# Patient Record
Sex: Male | Born: 1980
Health system: Southern US, Community
[De-identification: ages and names within clinical notes are randomized; demographics above are authoritative.]

## PROBLEM LIST (undated history)

## (undated) DIAGNOSIS — Z21 Asymptomatic human immunodeficiency virus [HIV] infection status: Secondary | ICD-10-CM

## (undated) DIAGNOSIS — B20 Human immunodeficiency virus [HIV] disease: Secondary | ICD-10-CM

## (undated) DIAGNOSIS — F319 Bipolar disorder, unspecified: Secondary | ICD-10-CM

## (undated) DIAGNOSIS — F419 Anxiety disorder, unspecified: Secondary | ICD-10-CM

---

## 2010-10-26 ENCOUNTER — Emergency Department (HOSPITAL_COMMUNITY): Payer: PRIVATE HEALTH INSURANCE

## 2010-10-26 ENCOUNTER — Emergency Department (HOSPITAL_COMMUNITY)
Admission: EM | Admit: 2010-10-26 | Discharge: 2010-10-26 | Disposition: A | Discharge status: Home / Self Care | Payer: PRIVATE HEALTH INSURANCE | Attending: Emergency Medicine | Admitting: Emergency Medicine

## 2010-10-26 DIAGNOSIS — M542 Cervicalgia: Secondary | ICD-10-CM | POA: Insufficient documentation

## 2010-10-26 DIAGNOSIS — S335XXA Sprain of ligaments of lumbar spine, initial encounter: Secondary | ICD-10-CM | POA: Insufficient documentation

## 2010-10-26 DIAGNOSIS — M545 Low back pain, unspecified: Secondary | ICD-10-CM | POA: Insufficient documentation

## 2010-10-26 DIAGNOSIS — S139XXA Sprain of joints and ligaments of unspecified parts of neck, initial encounter: Secondary | ICD-10-CM | POA: Insufficient documentation

## 2011-12-28 ENCOUNTER — Emergency Department (HOSPITAL_COMMUNITY): Payer: Self-pay

## 2011-12-28 ENCOUNTER — Encounter (HOSPITAL_COMMUNITY): Payer: Self-pay

## 2011-12-28 ENCOUNTER — Emergency Department (HOSPITAL_COMMUNITY)
Admission: EM | Admit: 2011-12-28 | Discharge: 2011-12-29 | Disposition: A | Discharge status: Home / Self Care | Payer: Self-pay | Attending: Emergency Medicine | Admitting: Emergency Medicine

## 2011-12-28 DIAGNOSIS — S0180XA Unspecified open wound of other part of head, initial encounter: Secondary | ICD-10-CM | POA: Insufficient documentation

## 2011-12-28 DIAGNOSIS — R51 Headache: Secondary | ICD-10-CM | POA: Insufficient documentation

## 2011-12-28 DIAGNOSIS — S60229A Contusion of unspecified hand, initial encounter: Secondary | ICD-10-CM

## 2011-12-28 DIAGNOSIS — T07XXXA Unspecified multiple injuries, initial encounter: Secondary | ICD-10-CM

## 2011-12-28 DIAGNOSIS — IMO0002 Reserved for concepts with insufficient information to code with codable children: Secondary | ICD-10-CM

## 2011-12-28 DIAGNOSIS — S022XXA Fracture of nasal bones, initial encounter for closed fracture: Secondary | ICD-10-CM | POA: Insufficient documentation

## 2011-12-28 MED ORDER — IBUPROFEN 800 MG PO TABS
800.0000 mg | ORAL_TABLET | Freq: Once | ORAL | Status: AC
  Administered 2011-12-28: 800 mg via ORAL
  Filled 2011-12-28: qty 1
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
## 2011-12-29 ENCOUNTER — Emergency Department (HOSPITAL_COMMUNITY): Payer: Self-pay

## 2011-12-29 MED ORDER — TETANUS-DIPHTH-ACELL PERTUSSIS 5-2.5-18.5 LF-MCG/0.5 IM SUSP
0.5000 mL | Freq: Once | INTRAMUSCULAR | Status: AC
  Administered 2011-12-29: 0.5 mL via INTRAMUSCULAR
  Filled 2011-12-29: qty 0.5

## 2011-12-29 MED ORDER — IBUPROFEN 800 MG PO TABS: 800.0000 mg | ORAL_TABLET | Freq: Three times a day (TID) | ORAL | Status: AC

## 2011-12-29 MED ORDER — HYDROCODONE-ACETAMINOPHEN 5-500 MG PO TABS: 1.0000 | ORAL_TABLET | Freq: Four times a day (QID) | ORAL | Status: AC | PRN

## 2011-12-29 MED ORDER — HYDROMORPHONE HCL PF 1 MG/ML IJ SOLN
1.0000 mg | Freq: Once | INTRAMUSCULAR | Status: AC
  Administered 2011-12-29: 1 mg via INTRAMUSCULAR
  Filled 2011-12-29: qty 1

## 2011-12-29 MED ORDER — CEPHALEXIN 500 MG PO CAPS: 500.0000 mg | ORAL_CAPSULE | Freq: Four times a day (QID) | ORAL | Status: AC

## 2011-12-29 MED ORDER — LIDOCAINE HCL (PF) 1 % IJ SOLN
INTRAMUSCULAR | Status: AC
  Administered 2011-12-29: 02:00:00 via INTRADERMAL
  Filled 2011-12-29: qty 5

## 2011-12-29 NOTE — ED Notes (Signed)
Discharge instructions reviewed with pt, questions answered. Pt verbalized understanding.  
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
## 2011-12-29 NOTE — Discharge Instructions (Signed)
Facial Fracture  A facial fracture is a break in one of the bones of your face. Your nasal bones have been fractured. HOME CARE INSTRUCTIONS  Protect the injured part of your face until it is healed.  Do not participate in activities which give chance for re-injury until your doctor approves.  Gently wash and dry your face.  Wear head and facial protection while riding a bicycle, motorcycle, or snowmobile.  SEEK MEDICAL CARE IF:  An oral temperature above 102 F (38.9 C) develops.  You have severe headaches or notice changes in your vision.  You have new numbness or tingling in your face.  You develop nausea (feeling sick to your stomach), vomiting or a stiff neck.  SEEK IMMEDIATE MEDICAL CARE IF:  You develop difficulty seeing or experience double vision.  You become dizzy, lightheaded, or faint.  You develop trouble speaking, breathing, or swallowing.  You have a watery discharge from your nose or ear.  MAKE SURE YOU:  Understand these instructions.  Will watch your condition.  Will get help right away if you are not doing well or get worse.

## 2012-08-29 ENCOUNTER — Encounter (HOSPITAL_COMMUNITY): Payer: Self-pay | Admitting: Emergency Medicine

## 2012-08-29 ENCOUNTER — Emergency Department (HOSPITAL_COMMUNITY)
Admission: EM | Admit: 2012-08-29 | Discharge: 2012-08-29 | Disposition: A | Discharge status: Home / Self Care | Payer: Self-pay | Attending: Emergency Medicine | Admitting: Emergency Medicine

## 2012-08-29 DIAGNOSIS — S61409A Unspecified open wound of unspecified hand, initial encounter: Secondary | ICD-10-CM | POA: Insufficient documentation

## 2012-08-29 DIAGNOSIS — T07XXXA Unspecified multiple injuries, initial encounter: Secondary | ICD-10-CM

## 2012-08-29 DIAGNOSIS — F1910 Other psychoactive substance abuse, uncomplicated: Secondary | ICD-10-CM | POA: Insufficient documentation

## 2012-08-29 DIAGNOSIS — F17200 Nicotine dependence, unspecified, uncomplicated: Secondary | ICD-10-CM | POA: Insufficient documentation

## 2012-08-29 DIAGNOSIS — S21109A Unspecified open wound of unspecified front wall of thorax without penetration into thoracic cavity, initial encounter: Secondary | ICD-10-CM | POA: Insufficient documentation

## 2012-08-29 DIAGNOSIS — S41109A Unspecified open wound of unspecified upper arm, initial encounter: Secondary | ICD-10-CM | POA: Insufficient documentation

## 2012-08-29 LAB — RAPID URINE DRUG SCREEN, HOSP PERFORMED
Benzodiazepines: POSITIVE — AB
Cocaine: POSITIVE — AB
Opiates: NOT DETECTED

## 2012-08-29 MED ORDER — IBUPROFEN 800 MG PO TABS
800.0000 mg | ORAL_TABLET | Freq: Once | ORAL | Status: AC
  Administered 2012-08-29: 800 mg via ORAL
  Filled 2012-08-29: qty 1
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
## 2013-04-09 ENCOUNTER — Encounter (HOSPITAL_COMMUNITY): Payer: Self-pay | Admitting: Emergency Medicine

## 2013-04-09 ENCOUNTER — Emergency Department (HOSPITAL_COMMUNITY)
Admission: EM | Admit: 2013-04-09 | Discharge: 2013-04-09 | Disposition: A | Discharge status: Home / Self Care | Payer: No Typology Code available for payment source | Attending: Emergency Medicine | Admitting: Emergency Medicine

## 2013-04-09 DIAGNOSIS — S139XXA Sprain of joints and ligaments of unspecified parts of neck, initial encounter: Secondary | ICD-10-CM | POA: Insufficient documentation

## 2013-04-09 DIAGNOSIS — Z79899 Other long term (current) drug therapy: Secondary | ICD-10-CM | POA: Insufficient documentation

## 2013-04-09 DIAGNOSIS — IMO0002 Reserved for concepts with insufficient information to code with codable children: Secondary | ICD-10-CM | POA: Insufficient documentation

## 2013-04-09 DIAGNOSIS — F17200 Nicotine dependence, unspecified, uncomplicated: Secondary | ICD-10-CM | POA: Insufficient documentation

## 2013-04-09 DIAGNOSIS — S39012A Strain of muscle, fascia and tendon of lower back, initial encounter: Secondary | ICD-10-CM

## 2013-04-09 DIAGNOSIS — Y9389 Activity, other specified: Secondary | ICD-10-CM | POA: Insufficient documentation

## 2013-04-09 DIAGNOSIS — Y92410 Unspecified street and highway as the place of occurrence of the external cause: Secondary | ICD-10-CM | POA: Insufficient documentation

## 2013-04-09 DIAGNOSIS — S161XXA Strain of muscle, fascia and tendon at neck level, initial encounter: Secondary | ICD-10-CM

## 2013-04-09 MED ORDER — METHOCARBAMOL 500 MG PO TABS: 500.0000 mg | ORAL_TABLET | Freq: Two times a day (BID) | ORAL | Status: DC

## 2013-04-09 MED ORDER — HYDROCODONE-ACETAMINOPHEN 5-325 MG PO TABS: 1.0000 | ORAL_TABLET | ORAL | Status: DC | PRN

## 2013-04-09 MED ORDER — IBUPROFEN 800 MG PO TABS
800.0000 mg | ORAL_TABLET | Freq: Once | ORAL | Status: AC
  Administered 2013-04-09: 800 mg via ORAL
  Filled 2013-04-09: qty 1
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
## 2013-04-10 NOTE — ED Provider Notes (Signed)
Medical screening examination/treatment/procedure(s) were performed by non-physician practitioner and as supervising physician I was immediately available for consultation/collaboration.   Shanna Cisco, MD 04/10/13 [phone]

## 2013-04-22 ENCOUNTER — Encounter (HOSPITAL_COMMUNITY): Payer: Self-pay | Admitting: Emergency Medicine

## 2013-04-22 ENCOUNTER — Emergency Department (HOSPITAL_COMMUNITY)
Admission: EM | Admit: 2013-04-22 | Discharge: 2013-04-23 | Disposition: A | Discharge status: Home / Self Care | Payer: No Typology Code available for payment source | Attending: Emergency Medicine | Admitting: Emergency Medicine

## 2013-04-22 DIAGNOSIS — M542 Cervicalgia: Secondary | ICD-10-CM | POA: Insufficient documentation

## 2013-04-22 DIAGNOSIS — S39012S Strain of muscle, fascia and tendon of lower back, sequela: Secondary | ICD-10-CM

## 2013-04-22 DIAGNOSIS — M545 Low back pain, unspecified: Secondary | ICD-10-CM | POA: Insufficient documentation

## 2013-04-22 DIAGNOSIS — S161XXS Strain of muscle, fascia and tendon at neck level, sequela: Secondary | ICD-10-CM

## 2013-04-22 DIAGNOSIS — F17200 Nicotine dependence, unspecified, uncomplicated: Secondary | ICD-10-CM | POA: Insufficient documentation

## 2013-04-22 DIAGNOSIS — Z791 Long term (current) use of non-steroidal anti-inflammatories (NSAID): Secondary | ICD-10-CM | POA: Insufficient documentation

## 2013-04-22 MED ORDER — HYDROCODONE-ACETAMINOPHEN 5-325 MG PO TABS
1.0000 | ORAL_TABLET | Freq: Once | ORAL | Status: AC
  Administered 2013-04-22: 1 via ORAL
  Filled 2013-04-22: qty 1
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
## 2013-04-23 ENCOUNTER — Emergency Department (HOSPITAL_COMMUNITY): Payer: No Typology Code available for payment source

## 2013-04-23 MED ORDER — NAPROXEN 500 MG PO TABS: 500.0000 mg | ORAL_TABLET | Freq: Two times a day (BID) | ORAL | Status: DC

## 2013-04-23 MED ORDER — CYCLOBENZAPRINE HCL 10 MG PO TABS: 10.0000 mg | ORAL_TABLET | Freq: Two times a day (BID) | ORAL | Status: DC | PRN

## 2013-04-23 NOTE — ED Provider Notes (Signed)
Medical screening examination/treatment/procedure(s) were performed by non-physician practitioner and as supervising physician I was immediately available for consultation/collaboration.  Olivia Mackie, MD 04/23/13 [phone]

## 2013-07-03 ENCOUNTER — Encounter (HOSPITAL_COMMUNITY): Payer: Self-pay | Admitting: Emergency Medicine

## 2013-07-03 ENCOUNTER — Emergency Department (HOSPITAL_COMMUNITY): Payer: PRIVATE HEALTH INSURANCE

## 2013-07-03 ENCOUNTER — Emergency Department (HOSPITAL_COMMUNITY)
Admission: EM | Admit: 2013-07-03 | Discharge: 2013-07-03 | Payer: Self-pay | Attending: Emergency Medicine | Admitting: Emergency Medicine

## 2013-07-03 DIAGNOSIS — Z791 Long term (current) use of non-steroidal anti-inflammatories (NSAID): Secondary | ICD-10-CM | POA: Insufficient documentation

## 2013-07-03 DIAGNOSIS — IMO0002 Reserved for concepts with insufficient information to code with codable children: Secondary | ICD-10-CM | POA: Insufficient documentation

## 2013-07-03 DIAGNOSIS — S0993XA Unspecified injury of face, initial encounter: Secondary | ICD-10-CM | POA: Insufficient documentation

## 2013-07-03 DIAGNOSIS — S51809A Unspecified open wound of unspecified forearm, initial encounter: Secondary | ICD-10-CM | POA: Insufficient documentation

## 2013-07-03 DIAGNOSIS — S298XXA Other specified injuries of thorax, initial encounter: Secondary | ICD-10-CM | POA: Insufficient documentation

## 2013-07-03 DIAGNOSIS — S0003XA Contusion of scalp, initial encounter: Secondary | ICD-10-CM | POA: Insufficient documentation

## 2013-07-03 DIAGNOSIS — F17200 Nicotine dependence, unspecified, uncomplicated: Secondary | ICD-10-CM | POA: Insufficient documentation

## 2013-07-03 DIAGNOSIS — S060X0A Concussion without loss of consciousness, initial encounter: Secondary | ICD-10-CM | POA: Insufficient documentation

## 2013-07-03 HISTORY — DX: Asymptomatic human immunodeficiency virus (hiv) infection status: Z21

## 2013-07-03 HISTORY — DX: Human immunodeficiency virus (HIV) disease: B20

## 2013-07-03 MED ORDER — HYDROCODONE-ACETAMINOPHEN 5-325 MG PO TABS: 2.0000 | ORAL_TABLET | Freq: Once | ORAL | Status: DC

## 2013-07-03 MED ORDER — HYDROCODONE-ACETAMINOPHEN 5-325 MG PO TABS: 1.0000 | ORAL_TABLET | Freq: Four times a day (QID) | ORAL | Status: DC | PRN

## 2013-07-03 MED ORDER — HYDROCODONE-ACETAMINOPHEN 5-325 MG PO TABS
2.0000 | ORAL_TABLET | Freq: Once | ORAL | Status: AC
  Administered 2013-07-03: 2 via ORAL
  Filled 2013-07-03: qty 2
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
## 2013-07-03 NOTE — ED Notes (Signed)
Bed: ZO10 Expected date:  Expected time:  Means of arrival:  Comments: ems- fall/ hematoma?

## 2013-07-16 ENCOUNTER — Ambulatory Visit (INDEPENDENT_AMBULATORY_CARE_PROVIDER_SITE_OTHER): Payer: Self-pay

## 2013-07-16 DIAGNOSIS — B20 Human immunodeficiency virus [HIV] disease: Secondary | ICD-10-CM

## 2013-07-16 DIAGNOSIS — Z113 Encounter for screening for infections with a predominantly sexual mode of transmission: Secondary | ICD-10-CM

## 2013-07-16 DIAGNOSIS — Z23 Encounter for immunization: Secondary | ICD-10-CM

## 2013-07-16 DIAGNOSIS — Z79899 Other long term (current) drug therapy: Secondary | ICD-10-CM

## 2013-07-16 LAB — CBC WITH DIFFERENTIAL/PLATELET
Basophils Relative: 1 % (ref 0–1)
Eosinophils Relative: 3 % (ref 0–5)
Lymphocytes Relative: 30 % (ref 12–46)
Lymphs Abs: 1.3 10*3/uL (ref 0.7–4.0)
Neutro Abs: 2.5 10*3/uL (ref 1.7–7.7)
Neutrophils Relative %: 58 % (ref 43–77)
Platelets: 191 10*3/uL (ref 150–400)
RBC: 4.69 MIL/uL (ref 4.22–5.81)
WBC: 4.3 10*3/uL (ref 4.0–10.5)

## 2013-07-16 LAB — COMPLETE METABOLIC PANEL WITH GFR
ALT: 14 U/L (ref 0–53)
AST: 17 U/L (ref 0–37)
Alkaline Phosphatase: 48 U/L (ref 39–117)
CO2: 30 mEq/L (ref 19–32)
Calcium: 8.8 mg/dL (ref 8.4–10.5)
Chloride: 106 mEq/L (ref 96–112)
Creat: 1.04 mg/dL (ref 0.50–1.35)
GFR, Est African American: >89 mL/min
Sodium: 140 mEq/L (ref 135–145)
Total Bilirubin: 0.4 mg/dL (ref 0.3–1.2)
Total Protein: 7.1 g/dL (ref 6.0–8.3)

## 2013-07-16 LAB — RPR

## 2013-07-16 LAB — HEPATITIS A ANTIBODY, TOTAL: Hep A Total Ab: NONREACTIVE

## 2013-07-16 LAB — LIPID PANEL
Cholesterol: 122 mg/dL (ref 0–200)
HDL: 40 mg/dL (ref >39)
Total CHOL/HDL Ratio: 3.1 Ratio

## 2013-07-16 LAB — HEPATITIS C ANTIBODY: HCV Ab: NEGATIVE

## 2013-07-16 LAB — HEPATITIS B SURFACE ANTIBODY,QUALITATIVE: Hep B S Ab: NEGATIVE

## 2013-07-17 LAB — URINALYSIS
Hgb urine dipstick: NEGATIVE
Leukocytes, UA: NEGATIVE
Nitrite: NEGATIVE
Protein, ur: NEGATIVE mg/dL
Urobilinogen, UA: 0.2 mg/dL (ref 0.0–1.0)
pH: 5.5 (ref 5.0–8.0)

## 2013-07-17 LAB — T-HELPER CELL (CD4) - (RCID CLINIC ONLY)
CD4 % Helper T Cell: 22 % — ABNORMAL LOW (ref 33–55)
CD4 T Cell Abs: 260 /uL — ABNORMAL LOW (ref 400–2700)

## 2013-07-19 LAB — HIV-1 RNA ULTRAQUANT REFLEX TO GENTYP+
HIV 1 RNA Quant: 63493 copies/mL — ABNORMAL HIGH (ref <20)
HIV-1 RNA Quant, Log: 4.8 {Log} — ABNORMAL HIGH (ref <1.30)

## 2013-07-20 DIAGNOSIS — B20 Human immunodeficiency virus [HIV] disease: Secondary | ICD-10-CM | POA: Insufficient documentation
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
## 2013-07-22 LAB — HIV-1 GENOTYPR PLUS

## 2013-08-03 ENCOUNTER — Ambulatory Visit (INDEPENDENT_AMBULATORY_CARE_PROVIDER_SITE_OTHER): Payer: Self-pay | Admitting: Internal Medicine

## 2013-08-03 ENCOUNTER — Encounter: Payer: Self-pay | Admitting: Internal Medicine

## 2013-08-03 VITALS — BP 160/105 | HR 91 | Temp 98.6°F | Ht 73.0 in | Wt 198.5 lb

## 2013-08-03 DIAGNOSIS — F17200 Nicotine dependence, unspecified, uncomplicated: Secondary | ICD-10-CM | POA: Insufficient documentation

## 2013-08-03 DIAGNOSIS — F329 Major depressive disorder, single episode, unspecified: Secondary | ICD-10-CM

## 2013-08-03 DIAGNOSIS — B20 Human immunodeficiency virus [HIV] disease: Secondary | ICD-10-CM

## 2013-08-03 DIAGNOSIS — Z23 Encounter for immunization: Secondary | ICD-10-CM

## 2013-08-03 MED ORDER — ELVITEG-COBIC-EMTRICIT-TENOFDF 150-150-200-300 MG PO TABS: 1.0000 | ORAL_TABLET | Freq: Every day | ORAL | Status: DC

## 2013-08-03 NOTE — Assessment & Plan Note (Signed)
He is having depression with his life situation.  He was given information for counseling and a support group

## 2013-08-03 NOTE — Assessment & Plan Note (Signed)
I did discuss all the treatment options and need for compliance. He tells me he is motivated for treatment and ready to start therapy. We have decided to start [facility].he will get this is a drug assistance program. I will have him return in about 3 weeks to see if he has started the medication and able to get it and then schedule lab visit after that. Side effects were discussed with him. He does have a baseline 103n.  All questions were answered.

## 2013-08-03 NOTE — Assessment & Plan Note (Signed)
I discussed smoking cessation. 
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
## 2013-08-20 ENCOUNTER — Other Ambulatory Visit: Payer: Self-pay | Admitting: *Deleted

## 2013-08-20 DIAGNOSIS — B20 Human immunodeficiency virus [HIV] disease: Secondary | ICD-10-CM

## 2013-08-20 MED ORDER — ELVITEG-COBIC-EMTRICIT-TENOFDF 150-150-200-300 MG PO TABS: 1.0000 | ORAL_TABLET | Freq: Every day | ORAL | Status: DC

## 2013-08-24 ENCOUNTER — Ambulatory Visit: Payer: PRIVATE HEALTH INSURANCE | Admitting: Internal Medicine

## 2013-08-24 ENCOUNTER — Emergency Department (HOSPITAL_COMMUNITY)
Admission: EM | Admit: 2013-08-24 | Discharge: 2013-08-25 | Disposition: A | Discharge status: Home / Self Care | Payer: PRIVATE HEALTH INSURANCE | Attending: Emergency Medicine | Admitting: Emergency Medicine

## 2013-08-24 ENCOUNTER — Encounter (HOSPITAL_COMMUNITY): Payer: Self-pay | Admitting: Emergency Medicine

## 2013-08-24 DIAGNOSIS — Z21 Asymptomatic human immunodeficiency virus [HIV] infection status: Secondary | ICD-10-CM | POA: Insufficient documentation

## 2013-08-24 DIAGNOSIS — Z79899 Other long term (current) drug therapy: Secondary | ICD-10-CM | POA: Insufficient documentation

## 2013-08-24 DIAGNOSIS — B20 Human immunodeficiency virus [HIV] disease: Secondary | ICD-10-CM

## 2013-08-24 DIAGNOSIS — S0181XA Laceration without foreign body of other part of head, initial encounter: Secondary | ICD-10-CM

## 2013-08-24 DIAGNOSIS — S0121XA Laceration without foreign body of nose, initial encounter: Secondary | ICD-10-CM

## 2013-08-24 DIAGNOSIS — S0120XA Unspecified open wound of nose, initial encounter: Secondary | ICD-10-CM | POA: Insufficient documentation

## 2013-08-24 DIAGNOSIS — F17200 Nicotine dependence, unspecified, uncomplicated: Secondary | ICD-10-CM | POA: Insufficient documentation
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
## 2013-09-08 ENCOUNTER — Telehealth: Payer: Self-pay | Admitting: *Deleted

## 2013-09-08 NOTE — Telephone Encounter (Signed)
Message copied by [name], Kendrew Paci M on Tue Sep 08, 2013 10:18 AM ------      Message from: Gardiner BarefootOMER, ROBERT W      Created: Mon Sep 07, 2013 12:38 PM       Can you see if he has started Stribild.  He missed his follow up appt.  Also, there was a mention in an ED note that he may be going to jail.  Thanks ------

## 2013-09-08 NOTE — Telephone Encounter (Signed)
Left message asking patient to return our call re: medication and follow up appointment.  Per [facility], patient never picked up the stribild. [facility] [place] [facility]. [name], Kenidee Cregan M, RN

## 2013-10-13 ENCOUNTER — Ambulatory Visit: Payer: PRIVATE HEALTH INSURANCE | Admitting: Internal Medicine

## 2013-10-20 ENCOUNTER — Encounter: Payer: Self-pay | Admitting: Internal Medicine

## 2013-10-20 ENCOUNTER — Ambulatory Visit (INDEPENDENT_AMBULATORY_CARE_PROVIDER_SITE_OTHER): Payer: PRIVATE HEALTH INSURANCE | Admitting: Internal Medicine

## 2013-10-20 VITALS — BP 150/75 | HR 97 | Temp 97.9°F | Wt 193.0 lb

## 2013-10-20 DIAGNOSIS — B20 Human immunodeficiency virus [HIV] disease: Secondary | ICD-10-CM

## 2013-10-20 LAB — CBC WITH DIFFERENTIAL/PLATELET
BASOS PCT: 1 % (ref 0–1)
Basophils Absolute: 0 10*3/uL (ref 0.0–0.1)
EOS PCT: 1 % (ref 0–5)
Eosinophils Absolute: 0 10*3/uL (ref 0.0–0.7)
HEMATOCRIT: 47 % (ref 39.0–52.0)
Hemoglobin: 17.1 g/dL — ABNORMAL HIGH (ref 13.0–17.0)
Lymphocytes Relative: 34 % (ref 12–46)
Lymphs Abs: 1 10*3/uL (ref 0.7–4.0)
MCH: 32.8 pg (ref 26.0–34.0)
MCHC: 36.4 g/dL — AB (ref 30.0–36.0)
MCV: 90.2 fL (ref 78.0–100.0)
MONO ABS: 0.2 10*3/uL (ref 0.1–1.0)
MONOS PCT: 8 % (ref 3–12)
NEUTROS ABS: 1.8 10*3/uL (ref 1.7–7.7)
Neutrophils Relative %: 56 % (ref 43–77)
Platelets: 196 10*3/uL (ref 150–400)
RBC: 5.21 MIL/uL (ref 4.22–5.81)
RDW: 13.3 % (ref 11.5–15.5)
WBC: 3.1 10*3/uL — ABNORMAL LOW (ref 4.0–10.5)

## 2013-10-20 LAB — COMPLETE METABOLIC PANEL WITH GFR
ALK PHOS: 55 U/L (ref 39–117)
ALT: 24 U/L (ref 0–53)
AST: 27 U/L (ref 0–37)
Albumin: 4.7 g/dL (ref 3.5–5.2)
BUN: 9 mg/dL (ref 6–23)
CO2: 30 mEq/L (ref 19–32)
Calcium: 9.6 mg/dL (ref 8.4–10.5)
Chloride: 103 mEq/L (ref 96–112)
Creat: 1.16 mg/dL (ref 0.50–1.35)
GFR, EST NON AFRICAN AMERICAN: 82 mL/min
GFR, Est African American: >89 mL/min
Glucose, Bld: 77 mg/dL (ref 70–99)
POTASSIUM: 4.4 meq/L (ref 3.5–5.3)
SODIUM: 141 meq/L (ref 135–145)
TOTAL PROTEIN: 7.6 g/dL (ref 6.0–8.3)
Total Bilirubin: 0.5 mg/dL (ref 0.2–1.2)

## 2013-10-20 NOTE — Assessment & Plan Note (Signed)
Will check labs today and if ok, rtc in 6 weks for labs and 8 weeks with me.
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
## 2013-10-20 NOTE — Progress Notes (Signed)
Patient ID: Justus Memoryerry Magnussen, male   DOB: 10/08/1980, 33 y.o.   MRN: [id number] HPI: Justus Memoryerry Minami is a 33 y.o. male who is here for his follow up visit  Allergies: No Known Allergies  Vitals: Temp: 97.9 F (36.6 C) (02/17 1508) Temp src: Oral (02/17 1508) BP: 150/75 mmHg (02/17 1508) Pulse Rate: 97 (02/17 1508)  Past Medical History: Past Medical History  Diagnosis Date  . HIV (human immunodeficiency virus infection)     Social History: History   Social History  . Marital Status: Single    Spouse Name: N/A    Number of Children: N/A  . Years of Education: N/A   Social History Main Topics  . Smoking status: Current Every Day Smoker -- 1.00 packs/day for 14 years    Types: Cigarettes  . Smokeless tobacco: Not on file  . Alcohol Use: 40.0 oz/week    80 drink(s) per week     Comment: beer  . Drug Use: 1.00 per week    Special: Marijuana  . Sexual Activity: Yes    Partners: Male    Birth Control/ Protection: None   Other Topics Concern  . Not on file   Social History Narrative  . No narrative on file    Previous Regimen:   Current Regimen: Stribild  Labs: HIV 1 RNA Quant (copies/mL)  Date Value  07/16/2013 63493*     CD4 T Cell Abs (/uL)  Date Value  07/16/2013 260*     Hep B S Ab (no units)  Date Value  07/16/2013 NEG      Hepatitis B Surface Ag (no units)  Date Value  07/16/2013 NEGATIVE      HCV Ab (no units)  Date Value  07/16/2013 NEGATIVE     CrCl: The CrCl is unknown because both a height and weight (above a minimum accepted value) are required for this calculation.  Lipids:    Component Value Date/Time   CHOL 122 07/16/2013 1222   TRIG 77 07/16/2013 1222   HDL 40 07/16/2013 1222   CHOLHDL 3.1 07/16/2013 1222   VLDL 15 07/16/2013 1222   LDLCALC 67 07/16/2013 1222    Assessment: 33 yo who was recently started on Stribild. He is doing really well on it. Has some minor side effects early on but gone since then. He has not missed  any doses. I gave him a key chain pill box to use for emergency situation.  Recommendations: Stribild 1 tab PO qday  Clide CliffPham, Shamra Bradeen Quang, PharmD Clinical Infectious Disease Pharmacist [facility] 10/20/2013, 3:58 PM

## 2013-10-21 LAB — HIV-1 RNA QUANT-NO REFLEX-BLD
HIV 1 RNA Quant: 55 copies/mL — ABNORMAL HIGH (ref <20)
HIV-1 RNA Quant, Log: 1.74 {Log} — ABNORMAL HIGH (ref <1.30)

## 2013-10-21 LAB — T-HELPER CELL (CD4) - (RCID CLINIC ONLY)
CD4 T CELL HELPER: 22 % — AB (ref 33–55)
CD4 T Cell Abs: 260 /uL — ABNORMAL LOW (ref 400–2700)

## 2014-02-14 ENCOUNTER — Encounter (HOSPITAL_COMMUNITY): Payer: Self-pay | Admitting: Emergency Medicine

## 2014-02-14 ENCOUNTER — Emergency Department (HOSPITAL_COMMUNITY)
Admission: EM | Admit: 2014-02-14 | Discharge: 2014-02-15 | Disposition: A | Discharge status: Other Acute Inpatient Hospital | Payer: PRIVATE HEALTH INSURANCE | Attending: Emergency Medicine | Admitting: Emergency Medicine

## 2014-02-14 DIAGNOSIS — F1410 Cocaine abuse, uncomplicated: Secondary | ICD-10-CM | POA: Insufficient documentation

## 2014-02-14 DIAGNOSIS — R45851 Suicidal ideations: Secondary | ICD-10-CM | POA: Insufficient documentation

## 2014-02-14 DIAGNOSIS — Z79899 Other long term (current) drug therapy: Secondary | ICD-10-CM | POA: Insufficient documentation

## 2014-02-14 DIAGNOSIS — F1910 Other psychoactive substance abuse, uncomplicated: Secondary | ICD-10-CM

## 2014-02-14 DIAGNOSIS — Z21 Asymptomatic human immunodeficiency virus [HIV] infection status: Secondary | ICD-10-CM | POA: Insufficient documentation

## 2014-02-14 DIAGNOSIS — F1310 Sedative, hypnotic or anxiolytic abuse, uncomplicated: Secondary | ICD-10-CM | POA: Insufficient documentation

## 2014-02-14 DIAGNOSIS — F1510 Other stimulant abuse, uncomplicated: Secondary | ICD-10-CM | POA: Insufficient documentation

## 2014-02-14 DIAGNOSIS — F329 Major depressive disorder, single episode, unspecified: Secondary | ICD-10-CM | POA: Insufficient documentation

## 2014-02-14 DIAGNOSIS — F17200 Nicotine dependence, unspecified, uncomplicated: Secondary | ICD-10-CM | POA: Insufficient documentation

## 2014-02-14 LAB — COMPREHENSIVE METABOLIC PANEL
ALK PHOS: 62 U/L (ref 39–117)
ALT: 20 U/L (ref 0–53)
AST: 24 U/L (ref 0–37)
Albumin: 4.4 g/dL (ref 3.5–5.2)
BUN: 8 mg/dL (ref 6–23)
CALCIUM: 9.9 mg/dL (ref 8.4–10.5)
CO2: 24 meq/L (ref 19–32)
Chloride: 101 mEq/L (ref 96–112)
Creatinine, Ser: 1.16 mg/dL (ref 0.50–1.35)
GFR, EST NON AFRICAN AMERICAN: 81 mL/min — AB (ref >90)
GLUCOSE: 103 mg/dL — AB (ref 70–99)
POTASSIUM: 4 meq/L (ref 3.7–5.3)
SODIUM: 140 meq/L (ref 137–147)
Total Bilirubin: 0.5 mg/dL (ref 0.3–1.2)
Total Protein: 7.9 g/dL (ref 6.0–8.3)

## 2014-02-14 LAB — CBC
HCT: 46.5 % (ref 39.0–52.0)
HEMOGLOBIN: 16.6 g/dL (ref 13.0–17.0)
MCH: 33.8 pg (ref 26.0–34.0)
MCHC: 35.7 g/dL (ref 30.0–36.0)
MCV: 94.7 fL (ref 78.0–100.0)
Platelets: 186 10*3/uL (ref 150–400)
RBC: 4.91 MIL/uL (ref 4.22–5.81)
RDW: 12.2 % (ref 11.5–15.5)
WBC: 4.4 10*3/uL (ref 4.0–10.5)

## 2014-02-14 LAB — RAPID URINE DRUG SCREEN, HOSP PERFORMED
Amphetamines: POSITIVE — AB
BARBITURATES: NOT DETECTED
Benzodiazepines: POSITIVE — AB
Cocaine: POSITIVE — AB
Opiates: NOT DETECTED
TETRAHYDROCANNABINOL: NOT DETECTED

## 2014-02-14 LAB — ETHANOL: Alcohol, Ethyl (B): <11 mg/dL (ref 0–11)

## 2014-02-14 LAB — ACETAMINOPHEN LEVEL: Acetaminophen (Tylenol), Serum: <15 ug/mL (ref 10–30)

## 2014-02-14 LAB — SALICYLATE LEVEL: Salicylate Lvl: <2 mg/dL — ABNORMAL LOW (ref 2.8–20.0)

## 2014-02-14 NOTE — ED Notes (Signed)
The pt last had drugs and alcohol 1700.  Chg rn and house coverage ron made aware of this pt.  No house sitter available   Females sitting with him at present no t related

## 2014-02-14 NOTE — ED Notes (Signed)
The  Pt  Wants to be detoxed from crack cocaine xanax percocet and alcohol.  He feels like he wants to take his life.  He is hiv pos and has not taken any of his prescribed drugs

## 2014-02-15 ENCOUNTER — Inpatient Hospital Stay (HOSPITAL_COMMUNITY)
Admission: AD | Admit: 2014-02-15 | Discharge: 2014-02-22 | DRG: 897 | Disposition: A | Discharge status: Rehabilitation Facility | Payer: Federal, State, Local not specified - Other | Source: Intra-hospital | Attending: Psychiatry | Admitting: Psychiatry

## 2014-02-15 ENCOUNTER — Encounter (HOSPITAL_COMMUNITY): Payer: Self-pay | Admitting: *Deleted

## 2014-02-15 DIAGNOSIS — F332 Major depressive disorder, recurrent severe without psychotic features: Secondary | ICD-10-CM | POA: Diagnosis present

## 2014-02-15 DIAGNOSIS — F1410 Cocaine abuse, uncomplicated: Secondary | ICD-10-CM | POA: Diagnosis present

## 2014-02-15 DIAGNOSIS — G4700 Insomnia, unspecified: Secondary | ICD-10-CM | POA: Diagnosis present

## 2014-02-15 DIAGNOSIS — Z598 Other problems related to housing and economic circumstances: Secondary | ICD-10-CM

## 2014-02-15 DIAGNOSIS — R45851 Suicidal ideations: Secondary | ICD-10-CM

## 2014-02-15 DIAGNOSIS — F17200 Nicotine dependence, unspecified, uncomplicated: Secondary | ICD-10-CM | POA: Diagnosis present

## 2014-02-15 DIAGNOSIS — F1110 Opioid abuse, uncomplicated: Principal | ICD-10-CM | POA: Diagnosis present

## 2014-02-15 DIAGNOSIS — F1210 Cannabis abuse, uncomplicated: Secondary | ICD-10-CM | POA: Diagnosis present

## 2014-02-15 DIAGNOSIS — F329 Major depressive disorder, single episode, unspecified: Secondary | ICD-10-CM

## 2014-02-15 DIAGNOSIS — F1994 Other psychoactive substance use, unspecified with psychoactive substance-induced mood disorder: Secondary | ICD-10-CM | POA: Diagnosis present

## 2014-02-15 DIAGNOSIS — F411 Generalized anxiety disorder: Secondary | ICD-10-CM | POA: Diagnosis present

## 2014-02-15 DIAGNOSIS — F4310 Post-traumatic stress disorder, unspecified: Secondary | ICD-10-CM | POA: Diagnosis present

## 2014-02-15 DIAGNOSIS — Z87820 Personal history of traumatic brain injury: Secondary | ICD-10-CM

## 2014-02-15 DIAGNOSIS — Z21 Asymptomatic human immunodeficiency virus [HIV] infection status: Secondary | ICD-10-CM | POA: Diagnosis present

## 2014-02-15 DIAGNOSIS — Z5987 Material hardship due to limited financial resources, not elsewhere classified: Secondary | ICD-10-CM

## 2014-02-15 DIAGNOSIS — F1310 Sedative, hypnotic or anxiolytic abuse, uncomplicated: Secondary | ICD-10-CM | POA: Diagnosis present

## 2014-02-15 DIAGNOSIS — F32A Depression, unspecified: Secondary | ICD-10-CM

## 2014-02-15 DIAGNOSIS — Z8249 Family history of ischemic heart disease and other diseases of the circulatory system: Secondary | ICD-10-CM

## 2014-02-15 DIAGNOSIS — F39 Unspecified mood [affective] disorder: Secondary | ICD-10-CM | POA: Diagnosis present

## 2014-02-15 DIAGNOSIS — F1010 Alcohol abuse, uncomplicated: Secondary | ICD-10-CM

## 2014-02-15 DIAGNOSIS — F1114 Opioid abuse with opioid-induced mood disorder: Secondary | ICD-10-CM

## 2014-02-15 MED ORDER — THIAMINE HCL 100 MG/ML IJ SOLN: 100.0000 mg | Freq: Every day | INTRAMUSCULAR | Status: DC

## 2014-02-15 MED ORDER — LORAZEPAM 1 MG PO TABS
0.0000 mg | ORAL_TABLET | Freq: Four times a day (QID) | ORAL | Status: DC
  Administered 2014-02-15 (×3): 1 mg via ORAL
  Filled 2014-02-15 (×3): qty 1

## 2014-02-15 MED ORDER — ACETAMINOPHEN 325 MG PO TABS
650.0000 mg | ORAL_TABLET | Freq: Four times a day (QID) | ORAL | Status: DC | PRN
  Administered 2014-02-16: 650 mg via ORAL
  Filled 2014-02-15 (×2): qty 2

## 2014-02-15 MED ORDER — ELVITEG-COBIC-EMTRICIT-TENOFDF 150-150-200-300 MG PO TABS
1.0000 | ORAL_TABLET | Freq: Every day | ORAL | Status: DC
  Administered 2014-02-16 – 2014-02-21 (×6): 1 via ORAL
  Filled 2014-02-15 (×8): qty 1

## 2014-02-15 MED ORDER — NICOTINE 21 MG/24HR TD PT24
21.0000 mg | MEDICATED_PATCH | Freq: Once | TRANSDERMAL | Status: DC
  Administered 2014-02-15: 21 mg via TRANSDERMAL
  Filled 2014-02-15: qty 1

## 2014-02-15 MED ORDER — LORAZEPAM 2 MG/ML IJ SOLN: 0.0000 mg | Freq: Four times a day (QID) | INTRAMUSCULAR | Status: DC

## 2014-02-15 MED ORDER — TRAZODONE HCL 50 MG PO TABS
50.0000 mg | ORAL_TABLET | Freq: Every evening | ORAL | Status: DC | PRN
  Administered 2014-02-15 – 2014-02-21 (×11): 50 mg via ORAL
  Filled 2014-02-15 (×15): qty 1

## 2014-02-15 MED ORDER — ELVITEG-COBIC-EMTRICIT-TENOFDF 150-150-200-300 MG PO TABS
1.0000 | ORAL_TABLET | Freq: Every day | ORAL | Status: DC
  Administered 2014-02-15: 1 via ORAL
  Filled 2014-02-15: qty 1

## 2014-02-15 MED ORDER — MAGNESIUM HYDROXIDE 400 MG/5ML PO SUSP: 30.0000 mL | Freq: Every day | ORAL | Status: DC | PRN

## 2014-02-15 MED ORDER — ALUM & MAG HYDROXIDE-SIMETH 200-200-20 MG/5ML PO SUSP: 30.0000 mL | ORAL | Status: DC | PRN

## 2014-02-15 MED ORDER — LORAZEPAM 1 MG PO TABS: 0.0000 mg | ORAL_TABLET | Freq: Two times a day (BID) | ORAL | Status: DC

## 2014-02-15 MED ORDER — LOPERAMIDE HCL 2 MG PO CAPS
2.0000 mg | ORAL_CAPSULE | Freq: Once | ORAL | Status: AC
  Administered 2014-02-15: 2 mg via ORAL
  Filled 2014-02-15: qty 1

## 2014-02-15 MED ORDER — LORAZEPAM 2 MG/ML IJ SOLN: 0.0000 mg | Freq: Two times a day (BID) | INTRAMUSCULAR | Status: DC

## 2014-02-15 MED ORDER — VITAMIN B-1 100 MG PO TABS
100.0000 mg | ORAL_TABLET | Freq: Every day | ORAL | Status: DC
  Administered 2014-02-15: 100 mg via ORAL
  Filled 2014-02-15: qty 1

## 2014-02-15 MED ORDER — HYDROXYZINE HCL 25 MG PO TABS
25.0000 mg | ORAL_TABLET | Freq: Four times a day (QID) | ORAL | Status: DC | PRN
  Administered 2014-02-15 – 2014-02-21 (×6): 25 mg via ORAL
  Filled 2014-02-15 (×5): qty 1
  Filled 2014-02-15: qty 40

## 2014-02-15 NOTE — ED Notes (Signed)
Pt was incontinent of small amount of liquid stool in bed, showered, complete bed linen changed.

## 2014-02-15 NOTE — ED Provider Notes (Signed)
CSN: [id number]     Arrival date & time 02/14/14  2149 History   First MD Initiated Contact with Patient 02/14/14 2345     Chief Complaint  Patient presents with  . Psychiatric Evaluation     (Consider location/radiation/quality/duration/timing/severity/associated sxs/prior Treatment) HPI  This patient is a 33 year old man who is HIV positive. He presents with request for inpatient treatment of depression and polysubstance abuse.  The patient has been using crack cocaine heavily, almost daily, for the past 2 years. He also drinks at least 80 ounces of beer per day. He takes oral opiate pain medications and benzodiazepines, when available via friends and associates.  The patient has had relationship difficulties. He has had persistent passive suicidal ideation for the past several days. He has no plan at this time. He says that he has attempted overdose on benzodiazepines in the past. He notes that he has not taken his HIV medications for the past 3 days.   Past Medical History  Diagnosis Date  . HIV (human immunodeficiency virus infection)    History reviewed. No pertinent past surgical history. Family History  Problem Relation Age of Onset  . Thyroid disease Mother   . Heart disease Maternal Grandmother   . Breast cancer Maternal Grandmother    History  Substance Use Topics  . Smoking status: Current Every Day Smoker -- 1.00 packs/day for 14 years    Types: Cigarettes  . Smokeless tobacco: Not on file  . Alcohol Use: 40.0 oz/week    80 drink(s) per week     Comment: beer    Review of Systems Ten point review of symptoms performed and is negative with the exception of symptoms noted above.     Allergies  Review of patient's allergies indicates no known allergies.  Home Medications   Prior to Admission medications   Medication Sig Start Date End Date Taking? Authorizing Provider  elvitegravir-cobicistat-emtricitabine-tenofovir (STRIBILD) 150-150-200-300 MG TABS  tablet Take 1 tablet by mouth daily. 08/20/13  Yes Gardiner Barefootobert W Comer, MD   BP 134/87  Pulse 100  Temp(Src) 98.6 F (37 C) (Oral)  Resp 14  SpO2 96% Physical Exam Gen: well developed and well nourished appearing Head: NCAT Eyes: PERL, EOMI Nose: no epistaixis or rhinorrhea Mouth/throat: mucosa is moist and pink Neck: supple, no stridor Lungs: CTA B, no wheezing, rhonchi or rales CV: regular rate and rythm, good distal pulses.  Abd: soft, notender, nondistended Back: no ttp, no cva ttp Skin: warm and dry Ext: no edema, normal to inspection Neuro: CN ii-xii grossly intact, no focal deficits Psyche; tearful affect,  calm and cooperative.  ED Course  Procedures (including critical care time) Labs Review  Results for orders placed during the hospital encounter of 02/14/14 (from the past 24 hour(s))  ACETAMINOPHEN LEVEL     Status: None   Collection Time    02/14/14 10:30 PM      Result Value Ref Range   Acetaminophen (Tylenol), Serum <15.0  10 - 30 ug/mL  CBC     Status: None   Collection Time    02/14/14 10:30 PM      Result Value Ref Range   WBC 4.4  4.0 - 10.5 K/uL   RBC 4.91  4.22 - 5.81 MIL/uL   Hemoglobin 16.6  13.0 - 17.0 g/dL   HCT 40.946.5  81.139.0 - 91.452.0 %   MCV 94.7  78.0 - 100.0 fL   MCH 33.8  26.0 - 34.0 pg   MCHC 35.7  30.0 -  36.0 g/dL   RDW 09.812.2  11.911.5 - 14.715.5 %   Platelets 186  150 - 400 K/uL  COMPREHENSIVE METABOLIC PANEL     Status: Abnormal   Collection Time    02/14/14 10:30 PM      Result Value Ref Range   Sodium 140  137 - 147 mEq/L   Potassium 4.0  3.7 - 5.3 mEq/L   Chloride 101  96 - 112 mEq/L   CO2 24  19 - 32 mEq/L   Glucose, Bld 103 (*) 70 - 99 mg/dL   BUN 8  6 - 23 mg/dL   Creatinine, Ser 8.291.16  0.50 - 1.35 mg/dL   Calcium 9.9  8.4 - 56.210.5 mg/dL   Total Protein 7.9  6.0 - 8.3 g/dL   Albumin 4.4  3.5 - 5.2 g/dL   AST 24  0 - 37 U/L   ALT 20  0 - 53 U/L   Alkaline Phosphatase 62  39 - 117 U/L   Total Bilirubin 0.5  0.3 - 1.2 mg/dL   GFR calc non  Af Amer 81 (*) >90 mL/min   GFR calc Af Amer >90  >90 mL/min  ETHANOL     Status: None   Collection Time    02/14/14 10:30 PM      Result Value Ref Range   Alcohol, Ethyl (B) <11  0 - 11 mg/dL  SALICYLATE LEVEL     Status: Abnormal   Collection Time    02/14/14 10:30 PM      Result Value Ref Range   Salicylate Lvl <2.0 (*) 2.8 - 20.0 mg/dL  URINE RAPID DRUG SCREEN (HOSP PERFORMED)     Status: Abnormal   Collection Time    02/14/14 10:30 PM      Result Value Ref Range   Opiates NONE DETECTED  NONE DETECTED   Cocaine POSITIVE (*) NONE DETECTED   Benzodiazepines POSITIVE (*) NONE DETECTED   Amphetamines POSITIVE (*) NONE DETECTED   Tetrahydrocannabinol NONE DETECTED  NONE DETECTED   Barbiturates NONE DETECTED  NONE DETECTED      MDM   Patient with major depression and passive suicidal ideation along with polysubstance abuse.  Concurrent diagnosis of HIV. Patient has been medically cleared. We will initiate SCIWA protocol. TTS consult initiated.     Brandt LoosenJulie Manly, MD 02/15/14 [phone]

## 2014-02-15 NOTE — Progress Notes (Signed)
Adult Psychoeducational Group Note  Date:  02/15/2014 Time:  9:55 PM  Group Topic/Focus:  Wrap-Up Group:   The focus of this group is to help patients review their daily goal of treatment and discuss progress on daily workbooks.  Participation Level:  Did Not Attend     Additional Comments:  Pt did not attend group   Zanayah Shadowens 02/15/2014, 9:55 PM

## 2014-02-15 NOTE — Progress Notes (Signed)
Patient is a 33 year old male admitted [facility] from [facility]. Patient came in with depression and suicidal thoughts, with no plan, after an increasing amount of substance abuse. Patient reports that he drinks 80 oz of alcohol a day and smoke "large amounts" of crack cocaine daily. Patient stated that he also gets xanax, percocet, valium, and oxycodone of the street when he can. Patient stated that he is depressed and having suicidal thoughts, patient contracts for safety. Patient denied homicidal thoughts, denied A/V/H. Patient stated that he does not have a good relationship with his family and is homeless at this point in time. Patient given unit policies and procedures. Patient expressed understanding. Patient oriented to unit, given fluids and food. Will continue to monitor.

## 2014-02-15 NOTE — BH Assessment (Signed)
Clinician consulted with Alberteen SamFran Hobson, NP who recommends Pt for inpatient admission. Per Clint Bolderori Beck, [facility].  TTS will seek placement at other facilities. Clinician provided updates to Dr. Lavella LemonsManly.  Yaakov Guthrieelilah Stewart, MSW, LCSW Triage Specialist [phone]

## 2014-02-15 NOTE — BH Assessment (Signed)
Clinician made few attempts to contact Dr to gather report prior to assessing pt. Clinician reviewed doctor's notes.  Clinician contacted [name], RN to gather information and request set up of tele-assessment. Assessment to be initiated.   Henry [name] Henry [name] Henry [name], MSW, LCSW Triage Specialist [phone]
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
## 2014-02-15 NOTE — Progress Notes (Signed)
  CARE MANAGEMENT ED NOTE 02/15/2014  Patient:  Henry [name],Henry [name]   Account Number:  [id number]  Date Initiated:  02/15/2014  Documentation initiated by:  Ferdinand CavaSCHETTINO,Azie Mcconahy  Subjective/Objective Assessment:   33 yo male presenting to the ED for detox and depression     Subjective/Objective Assessment Detail:       This patient is a 33 year old man who is HIV positive. He presents with request for inpatient treatment of depression and polysubstance abuse.      The patient has been using crack cocaine heavily, almost daily, for the past 2 years. He also drinks at least 80 ounces of beer per day. He takes oral opiate pain medications and benzodiazepines, when available via friends and associates.      The patient has had relationship difficulties. He has had persistent passive suicidal ideation for the past several days. He has no plan at this time. He says that he has attempted overdose on benzodiazepines in the past. He notes that he has not taken his HIV medications for the past 3 days.     Action/Plan:   Follow up appointment July 6th at 10:45 at [facility] with Dr. Luciana Axeomer   Action/Plan Detail:   Anticipated DC Date:       Status Recommendation to Physician:   Result of Recommendation:  Agreed    DC Planning Services  CM consult  Other    Choice offered to / List presented to:            Status of service:  Completed, signed off  ED Comments:   ED Comments Detail:  CM spoke with patient regarding ED visit. Patient stated that he does have HIV medications but he has transportation problems and at times is unable to pick up his medications. Spoke with patient regarding scheduling a follow up appointment with Dr. Luciana Axeomer. This RN CM called [facility] and scheduled an appointment on 03/08/2014 at 10:45 and this information was provided to the patient. When making the appointment at [facility] this CM was informed that the patient follows with [facility] and has a case manager that can assist with  transportation. Spoke with the patient and he confirmed that he has a case Chemical engineermanager Casey and he did provide bus passes and assistance with transportation. Encouraged patient to follow up with Baird Lyonsasey upon discharge for follow up with assistance for transportation needs.

## 2014-02-15 NOTE — ED Notes (Signed)
[facility]  CALLED  FOR  TRANSPORT   

## 2014-02-15 NOTE — ED Provider Notes (Signed)
Patient accepted at behavioral health hospital and agrees to transfer. [id number]  Henry [name] Henry BednaHurman Hornr, MD 02/15/14 [phone]
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
## 2014-02-16 ENCOUNTER — Encounter (HOSPITAL_COMMUNITY): Payer: Self-pay | Admitting: Psychiatry

## 2014-02-16 DIAGNOSIS — F1994 Other psychoactive substance use, unspecified with psychoactive substance-induced mood disorder: Secondary | ICD-10-CM

## 2014-02-16 DIAGNOSIS — R45851 Suicidal ideations: Secondary | ICD-10-CM

## 2014-02-16 DIAGNOSIS — F1410 Cocaine abuse, uncomplicated: Secondary | ICD-10-CM | POA: Diagnosis present

## 2014-02-16 DIAGNOSIS — F1114 Opioid abuse with opioid-induced mood disorder: Secondary | ICD-10-CM | POA: Diagnosis present

## 2014-02-16 DIAGNOSIS — F1010 Alcohol abuse, uncomplicated: Secondary | ICD-10-CM | POA: Diagnosis present

## 2014-02-16 DIAGNOSIS — F1310 Sedative, hypnotic or anxiolytic abuse, uncomplicated: Secondary | ICD-10-CM | POA: Diagnosis present

## 2014-02-16 MED ORDER — CHLORDIAZEPOXIDE HCL 25 MG PO CAPS
25.0000 mg | ORAL_CAPSULE | Freq: Every day | ORAL | Status: AC
  Administered 2014-02-19: 25 mg via ORAL
  Filled 2014-02-16: qty 1

## 2014-02-16 MED ORDER — LOPERAMIDE HCL 2 MG PO CAPS
2.0000 mg | ORAL_CAPSULE | ORAL | Status: AC | PRN
  Administered 2014-02-16: 4 mg via ORAL
  Administered 2014-02-17: 2 mg via ORAL
  Administered 2014-02-17: 4 mg via ORAL
  Administered 2014-02-17 – 2014-02-18 (×3): 2 mg via ORAL
  Filled 2014-02-16 (×3): qty 1
  Filled 2014-02-16: qty 2
  Filled 2014-02-16: qty 1
  Filled 2014-02-16: qty 2

## 2014-02-16 MED ORDER — NICOTINE 21 MG/24HR TD PT24
21.0000 mg | MEDICATED_PATCH | Freq: Every day | TRANSDERMAL | Status: DC
  Administered 2014-02-17 – 2014-02-21 (×5): 21 mg via TRANSDERMAL
  Filled 2014-02-16 (×4): qty 1
  Filled 2014-02-16: qty 14
  Filled 2014-02-16 (×2): qty 1
  Filled 2014-02-16: qty 14

## 2014-02-16 MED ORDER — CHLORDIAZEPOXIDE HCL 25 MG PO CAPS
25.0000 mg | ORAL_CAPSULE | Freq: Four times a day (QID) | ORAL | Status: AC
  Administered 2014-02-16 (×4): 25 mg via ORAL
  Filled 2014-02-16 (×4): qty 1

## 2014-02-16 MED ORDER — ONDANSETRON 4 MG PO TBDP
4.0000 mg | ORAL_TABLET | Freq: Four times a day (QID) | ORAL | Status: AC | PRN
  Administered 2014-02-16: 4 mg via ORAL
  Filled 2014-02-16: qty 1

## 2014-02-16 MED ORDER — CHLORDIAZEPOXIDE HCL 25 MG PO CAPS: 25.0000 mg | ORAL_CAPSULE | Freq: Four times a day (QID) | ORAL | Status: AC | PRN

## 2014-02-16 MED ORDER — VITAMIN B-1 100 MG PO TABS
100.0000 mg | ORAL_TABLET | Freq: Every day | ORAL | Status: DC
  Administered 2014-02-17 – 2014-02-21 (×5): 100 mg via ORAL
  Filled 2014-02-16 (×7): qty 1

## 2014-02-16 MED ORDER — NICOTINE 21 MG/24HR TD PT24
MEDICATED_PATCH | TRANSDERMAL | Status: AC
  Administered 2014-02-16: 21 mg
  Filled 2014-02-16: qty 1

## 2014-02-16 MED ORDER — CHLORDIAZEPOXIDE HCL 25 MG PO CAPS
25.0000 mg | ORAL_CAPSULE | ORAL | Status: AC
  Administered 2014-02-18 (×2): 25 mg via ORAL
  Filled 2014-02-16 (×2): qty 1

## 2014-02-16 MED ORDER — HYDROXYZINE HCL 25 MG PO TABS
25.0000 mg | ORAL_TABLET | Freq: Four times a day (QID) | ORAL | Status: AC | PRN
  Administered 2014-02-16: 25 mg via ORAL
  Filled 2014-02-16: qty 1

## 2014-02-16 MED ORDER — ADULT MULTIVITAMIN W/MINERALS CH
1.0000 | ORAL_TABLET | Freq: Every day | ORAL | Status: DC
  Administered 2014-02-16 – 2014-02-21 (×6): 1 via ORAL
  Filled 2014-02-16 (×10): qty 1

## 2014-02-16 MED ORDER — THIAMINE HCL 100 MG/ML IJ SOLN
100.0000 mg | Freq: Once | INTRAMUSCULAR | Status: AC
  Administered 2014-02-16: 100 mg via INTRAMUSCULAR
  Filled 2014-02-16: qty 2

## 2014-02-16 MED ORDER — CHLORDIAZEPOXIDE HCL 25 MG PO CAPS
25.0000 mg | ORAL_CAPSULE | Freq: Three times a day (TID) | ORAL | Status: AC
  Administered 2014-02-17 (×3): 25 mg via ORAL
  Filled 2014-02-16 (×3): qty 1

## 2014-02-16 NOTE — Clinical Social Work Note (Signed)
CSW faxed referral to [facility] at this time.    Henry IvanChelsea Horton, LCSW 02/16/2014  3:29 PM
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
## 2014-02-16 NOTE — [facility] Suicide Risk Assessment (Signed)
Suicide Risk Assessment  Admission Assessment     Nursing information obtained from:    Demographic factors:    Current Mental Status:    Loss Factors:    Historical Factors:    Risk Reduction Factors:    Total Time spent with patient: 45 minutes  CLINICAL FACTORS:   Depression:   Comorbid alcohol abuse/dependence Impulsivity Insomnia Alcohol/Substance Abuse/Dependencies  PCOGNITIVE FEATURES THAT CONTRIBUTE TO RISK:  Closed-mindedness Polarized thinking Thought constriction (tunnel vision)    SUICIDE RISK:   Moderate:  Frequent suicidal ideation with limited intensity, and duration, some specificity in terms of plans, no associated intent, good self-control, limited dysphoria/symptomatology, some risk factors present, and identifiable protective factors, including available and accessible social support.  PLAN OF CARE: Supportive approach/coping skills/relapse prevention                               Detox                               Reassess and address the co morbities  I certify that inpatient services furnished can reasonably be expected to improve the patient's condition.  LUGO,IRVING A 02/16/2014, 6:00 PM

## 2014-02-16 NOTE — Tx Team (Signed)
Interdisciplinary Treatment Plan Update (Adult)  Date: 02/16/2014   Time Reviewed: 10:24 AM  Progress in Treatment:  Attending groups: No.  Participating in groups:  No.  Taking medication as prescribed: Yes  Tolerating medication: Yes  Family/Significant othe contact made: Not yet. SPE required for this pt.  Patient understands diagnosis: Yes, AEB seeking treatment for ETOH detox/crack cocaine abuse, mood stabilization, SI, and med management.  Discussing patient identified problems/goals with staff: Yes  Medical problems stabilized or resolved: Yes  Denies suicidal/homicidal ideation: Yes during self report.  Patient has not harmed self or Others: Yes  New problem(s) identified:  Discharge Plan or Barriers: Pt currently not attending d/c planning groups. CSW assessing.  Additional comments: Patient is a 33 year old male admitted [facility] from [facility]. Patient came in with depression and suicidal thoughts, with no plan, after an increasing amount of substance abuse. Patient reports that he drinks 80 oz of alcohol a day and smoke "large amounts" of crack cocaine daily. Patient stated that he also gets xanax, percocet, valium, and oxycodone of the street when he can. Patient stated that he is depressed and having suicidal thoughts, patient contracts for safety. Patient denied homicidal thoughts, denied A/V/H. Patient stated that he does not have a good relationship with his family and is homeless at this point in time. Patient given unit policies and procedures. Patient expressed understanding. Patient oriented to unit, given fluids and food. Will continue to monitor.     Reason for Continuation of Hospitalization: Librium taper Med management Mood stabilization Estimated length of stay: 3-5 days  For review of initial/current patient goals, please see plan of care.  Attendees:  Patient:    Family:    Physician: Geoffery LyonsIrving Lugo MD 02/16/2014 10:23 AM   Nursing: Lupita Leashonna RN  02/16/2014 10:23 AM   Clinical  Social Worker Heather Smart, LCSWA  02/16/2014 10:23 AM   Other: Jan RN  02/16/2014 10:23 AM   Other: Rayfield Citizenaroline RN 02/16/2014 10:23 AM   Other: Chandra BatchAggie N. PA 02/16/2014 10:23 AM   Other:    Scribe for Treatment Team:  The Sherwin-WilliamsHeather Smart LCSWA 02/16/2014 10:24 AM

## 2014-02-16 NOTE — [facility] Group Notes (Signed)
[facility] LCSW Group Therapy  02/16/2014 1:47 PM  Type of Therapy:  Group Therapy  Participation Level:  Minimal  Participation Quality:  Attentive  Affect:  Depressed and Flat  Cognitive:  Oriented  Insight:  Limited  Engagement in Therapy:  Limited  Modes of Intervention:  Confrontation, Discussion, Education, Exploration, Problem-solving, Rapport Building, Socialization and Support  Summary of Progress/Problems: [facility] Speaker came to talk about his personal journey with substance abuse and addiction. The pt processed ways by which to relate to the speaker. [facility]HA speaker provided handouts and educational information pertaining to groups and services [facility]. Henry [name] was attentive throughout group. He left the room after 30 minutes and did not return. At this time, Henry [name] shows limited progress in the group setting.   Smart, Heather LCSWA 02/16/2014, 1:47 PM

## 2014-02-16 NOTE — [facility] Group Notes (Signed)
[facility] Group Notes:  (Nursing/MHT/Case Management/Adjunct)  Date:  02/16/2014  Time:  0900 am  Type of Therapy: Orientation Group  Participation Level:  Did Not Attend  Cranford MonBeaudry, Caroline Evans 02/16/2014, 12:26 PM

## 2014-02-16 NOTE — Progress Notes (Signed)
Adult Psychoeducational Group Note  Date: 02/16/2014  Time: 11:00am  Group Topic: Mental Health Jeopardy   Participation Level: Did not attend   Activity: Patients participated in jeopardy like game, answering questions relating to mental health awareness in categories such as Symptoms, Medications, Causes, Coping Skills, and Substance Abuse.   Additional Comments: Pt was informed that group was beginning, however pt did not attend.  Owen, Dana C  02/16/2014, 1:48 PM  

## 2014-02-16 NOTE — Progress Notes (Signed)
Patient ID: Henry [name] XXXDabbs, male   DOB: 06/23/1981, 33 y.o.   MRN: [id number] D: Patient placed on librium protocol.  He reports his withdrawal symptoms as tremors, diarrhea and cravings.  He states that he was "taking about everything."  He states that he moves around and always gets involved with people doing drugs.  Patient is homeless at this time.  Patient has not been attending groups today, electing to remain in his room.  Patient is isolative with minimal interaction with staff and his peers.  Patient reports passive SI, however, he contracts for safety.  He denies any HI/AVH.  A: continue to monitor medication management and withdrawal symptoms.  Continue safety checks every 15 minutes per protocol.  R: Patient is receptive to staff; his behavior is appropriate for situation.

## 2014-02-16 NOTE — Progress Notes (Signed)
Adult Psychoeducational Group Note  Date:  02/16/2014 Time:  9:42 PM  Group Topic/Focus:  Wrap-Up Group:   The focus of this group is to help patients review their daily goal of treatment and discuss progress on daily workbooks.  Participation Level:  Active  Participation Quality:  Appropriate  Affect:  Appropriate  Cognitive:  Appropriate  Insight: Appropriate  Engagement in Group:  Engaged  Modes of Intervention:  Discussion  Additional Comments:  Pt was present for wrap up group. He shared that he is thankful to be here. He said that he "was on top" prior to getting caught up with "the wrong people." He said that he is learning to focus his attention on his real friends and family members who are looking out for what is in his best interest. He set a goal for tomorrow for calling family members to tell them where he is and that he is safe. He also wants to begin deciding where to go when he discharges. He was cooperative during group, and interacted well with his peers.   Rosilyn MingsMingia, Kristen A 02/16/2014, 9:42 PM
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
## 2014-02-17 NOTE — Progress Notes (Signed)
D:Pt has been in bed this afternoon with minimal interaction. Pt c/o diarrhea this morning and was given prn medication and reported that it was effective. Pt rates depression and hopelessness as a 3 on 1-10 scale with 10 being the most depressed and hopeless.  A:Offered support, encouragement and 15 minute checks. R:Pt denies si and hi. Safety maintained on the unit.

## 2014-02-17 NOTE — Progress Notes (Signed)
D   Pt has been in bed most of the day and does not consistently attend groups   Pt did say he has diarrhea and he has been staying close to his bathroom all day   He appears depressed and anxious and somewhat guarded  He contracts for safety  A   Verbal support  Administered medications evaluated effectiveness   Educate on medications for diarrhea and administered the same   Q 15 min checks R   Pt safe at present

## 2014-02-17 NOTE — [facility] Group Notes (Signed)
[facility]   02/17/2014 10:33 AM  Participation Quality:  DID NOT ATTEND-pt sleeping in room   Smart, Corrie Reder LCSWA

## 2014-02-17 NOTE — [facility] Group Notes (Signed)
[facility]  02/17/2014 3:34 PM  Type of Therapy:  Group Therapy  Participation Level:  Did Not Attend  SmartLebron Quam, Heather LCSWA  02/17/2014, 3:34 PM

## 2014-02-17 NOTE — Progress Notes (Signed)
Adult Psychoeducational Group Note  Date:  02/17/2014 Time:  4:42 PM  Group Topic/Focus:  Personal Choices and Values:   The focus of this group is to help patients assess and explore the importance of values in their lives, how their values affect their decisions, how they express their values and what opposes their expression.  Participation Level:  Active  Participation Quality:  Appropriate  Affect:  Appropriate  Cognitive:  Alert and Oriented  Insight: Improving  Engagement in Group:  Engaged  Modes of Intervention:  Support  Additional Comments:  Pt shared in detail what brought him into the hospital.  Reynolds BowlClement, Dorothy D 02/17/2014, 4:42 PM

## 2014-02-17 NOTE — BHH Group Notes (Signed)
Pt attended NA group tonight. 

## 2014-02-17 NOTE — Clinical Social Work Note (Signed)
Per pt request, [facility]   [facility] Surgery[facility]ather Smart, LCSWA  02/17/2014 12:02 PM

## 2014-02-17 NOTE — Progress Notes (Signed)
D Pt. Denies SI and HI, does complain of diarrhea.  A Writer offered support and encouragement, adm. Medication to relieve the diarrhea.  R  Pt remains safe on the unit, imodium relieved the diarrhea.  Pt. Is very vocale about his experience with his friends and realizes he has to make life changes when he is discharged.

## 2014-02-18 DIAGNOSIS — F39 Unspecified mood [affective] disorder: Secondary | ICD-10-CM

## 2014-02-18 MED ORDER — LURASIDONE HCL 40 MG PO TABS
40.0000 mg | ORAL_TABLET | Freq: Every day | ORAL | Status: DC
  Administered 2014-02-18 – 2014-02-21 (×4): 40 mg via ORAL
  Filled 2014-02-18 (×6): qty 1

## 2014-02-18 NOTE — B[facility] Group Notes (Signed)
[facility] LCSW Group Therapy  02/18/2014 3:11 PM  Type of Therapy:  Group Therapy  Participation Level:  Did Not Attend- pt in room sleeping    PICKETT [name], Henry Khamis C 02/18/2014, 3:11 PM  

## 2014-02-18 NOTE — Progress Notes (Signed)
Adult Activity Group Note  Date: 02/18/2014 Time:2:45p  Group Topic: Art  Participation Level: Active  Participation Quality: Attentive  Affect:  Flat  Activity: Patients asked to create art to coincide with daily unit theme using any combination of markers, crayons, color pencils, construction paper, magazine clippings, glue, and scissors. Pt's wrote ideas on leaves of things they wanted to do this summer and posted them on the 300 hall.

## 2014-02-18 NOTE — Progress Notes (Signed)
[facility]  02/18/2014 4:02 PM Henry [name]  MRN:  [id number] Subjective:  Henry [name] endorses persistent mood instability. States he experiences depression, lack of energy, lack of motivation having to push himself to get up in the morning to get up and do things. Also admits to impulsivity and irritability going from 0 to 100 without much of a trigger. This mood instability he says is not drug related and has been persistent. He has been hesitant to try medications. He states he has family history of un diagnosed bipolar Diagnosis:   DSM5: Schizophrenia Disorders:  None Obsessive-Compulsive Disorders:  None Trauma-Stressor Disorders:  None Substance/Addictive Disorders:  Alcohol Related Disorder - Moderate (303.90) and Opioid Disorder - Moderate (304.00)  Benzodiazepine Use Disorder, Cocaine Use Disorder Depressive Disorders:  Major Depressive Disorder - Severe (296.23) Total Time spent with patient: 30 minutes  Axis I: Mood Disorder NOS  ADL's:  Intact  Sleep: Fair  Appetite:  Fair  Suicidal Ideation:  Plan:  denies Intent:  denies Means:  denies Homicidal Ideation:  Plan:  denies Intent:  denies Means:  denies AEB (as evidenced by):  Psychiatric Specialty Exam: Physical Exam  Review of Systems  Constitutional: Negative.   HENT: Negative.   Eyes: Negative.   Respiratory: Negative.   Cardiovascular: Negative.   Gastrointestinal: Negative.   Genitourinary: Negative.   Musculoskeletal: Negative.   Skin: Negative.   Endo/Heme/Allergies: Negative.   Psychiatric/Behavioral: Positive for depression and substance abuse. The patient is nervous/anxious.     Blood pressure 132/88, pulse 84, temperature 98 F (36.7 C), temperature source Oral, resp. rate 18, height 6\' 1"  (1.854 m), weight 88.451 kg (195 lb), SpO2 97.00%.Body mass index is 25.73 kg/(m^2).  General Appearance: Fairly Groomed  Patent attorneyye Contact::  Fair  Speech:  Clear and Coherent  Volume:  flcutuates  Mood:   Anxious, Depressed, Irritable and worried  Affect:  Labile  Thought Process:  Coherent and Goal Directed  Orientation:  Full (Time, Place, and Person)  Thought Content:  symptoms, events at the unit his response, worries, concerns  Suicidal Thoughts:  No  Homicidal Thoughts:  No  Memory:  Immediate;   Fair Recent;   Fair Remote;   Fair  Judgement:  Fair  Insight:  Present and Shallow  Psychomotor Activity:  Restlessness  Concentration:  Fair  Recall:  Henry [name]  Fund of Knowledge:NA  Language: Fair  Akathisia:  No  Handed:    AIMS (if indicated):     Assets:  Desire for Improvement  Sleep:  Number of Hours: 6.5   Musculoskeletal: Strength & Muscle Tone: within normal limits Gait & Station: normal Patient leans: N/A  Current Medications: Current Facility-Administered Medications  Medication Dose Route Frequency Provider Last Rate Last Dose  . acetaminophen (TYLENOL) tablet 650 mg  650 mg Oral Q6H PRN [name], PA-C   650 mg at 02/16/14 0749  . alum & mag hydroxide-simeth (MAALOX/MYLANTA) 200-200-20 MG/5ML suspension 30 mL  30 mL Oral Q4H PRN [name], PA-C      . chlordiazePOXIDE (LIBRIUM) capsule 25 mg  25 mg Oral Q6H PRN [name], MD      . chlordiazePOXIDE (LIBRIUM) capsule 25 mg  25 mg Oral BH-qamhs [name], MD   25 mg at 02/18/14 0846   Followed by  . [START ON 02/19/2014] chlordiazePOXIDE (LIBRIUM) capsule 25 mg  25 mg Oral Daily [name], MD      . elvitegravir-cobicistat-emtricitabine-tenofovir (STRIBILD) 150-150-200-300 MG tablet 1 tablet  1 tablet Oral Q breakfast [name], PA-C   1 tablet at 02/18/14 0845  . hydrOXYzine (ATARAX/VISTARIL) tablet 25 mg  25 mg Oral Q6H PRN [name], PA-C   25 mg at 02/16/14 16100659  . hydrOXYzine (ATARAX/VISTARIL) tablet 25 mg  25 mg Oral Q6H PRN [name], MD   25 mg at 02/16/14 2140  . loperamide (IMODIUM) capsule 2-4 mg  2-4 mg Oral PRN [name], MD   2 mg at 02/18/14 0847  . lurasidone  (LATUDA) tablet 40 mg  40 mg Oral QHS [name], MD      . magnesium hydroxide (MILK OF MAGNESIA) suspension 30 mL  30 mL Oral Daily PRN [name], PA-C      . multivitamin with minerals tablet 1 tablet  1 tablet Oral Daily [name], MD   1 tablet at 02/18/14 0846  . nicotine (NICODERM CQ - dosed in mg/24 hours) patch 21 mg  21 mg Transdermal Daily [name], MD   21 mg at 02/18/14 0845  . ondansetron (ZOFRAN-ODT) disintegrating tablet 4 mg  4 mg Oral Q6H PRN [name], MD   4 mg at 02/16/14 2139  . thiamine (VITAMIN B-1) tablet 100 mg  100 mg Oral Daily [name], MD   100 mg at 02/18/14 0846  . traZODone (DESYREL) tablet 50 mg  50 mg Oral QHS,MR X 1 [name], PA-C   50 mg at 02/17/14 2150    Lab Results: No results found for this or any previous visit (from the past 48 hour(s)).  Physical Findings: AIMS: Facial and Oral Movements Muscles of Facial Expression: None, normal Lips and Perioral Area: None, normal Jaw: None, normal Tongue: None, normal,Extremity Movements Upper (arms, wrists, hands, fingers): None, normal Lower (legs, knees, ankles, toes): None, normal, Trunk Movements Neck, shoulders, hips: None, normal, Overall Severity Severity of abnormal movements (highest score from questions above): None, normal Incapacitation due to abnormal movements: None, normal Patient's awareness of abnormal movements (rate only patient's report): No Awareness, Dental Status Current problems with teeth and/or dentures?: No Does patient usually wear dentures?: No  CIWA:  CIWA-Ar Total: 3 COWS:  COWS Total Score: 1  Treatment Plan Summary: Daily contact with patient to assess and evaluate symptoms and progress in treatment Medication management  Plan: Supportive approach/coping skills/relapse prevention           Trial with Kasandra KnudsenLatuda Henry Loft(Henry [name] states he will like to try JordanLatuda. He has had first hand            experience with people who have responded well to it and  will like to try it  Medical Decision Making Problem Points:  Established problem, worsening (2) and Review of psycho-social stressors (1) Data Points:  Review of new medications or change in dosage (2)  I certify that inpatient services furnished can reasonably be expected to improve the patient's condition.   Quintina Hakeem A 02/18/2014, 4:02 PM

## 2014-02-18 NOTE — BHH Group Notes (Signed)
0900 nursing orientation group    The focus of this group is to educate the patient on the purpose and policies of crisis stabilization and provide a format to answer questions about their admission.  The group details unit policies and expectations of patients while admitted.    Pt did not attend he was in his bed and refused to get up.

## 2014-02-18 NOTE — BHH Suicide Risk Assessment (Signed)
B[facility] INPATIENT:  Family/Significant Other Suicide Prevention Education  Suicide Prevention Education:  Education Completed; Henry RansomKendra [name] has been identified by the patient as the family member/significant other with whom the patient will be residing, and identified as the person(s) who will aid the patient in the event of a mental health crisis (suicidal ideations/suicide attempt).  With written consent from the patient, the family member/significant other has been provided the following suicide prevention education, prior to the and/or following the discharge of the patient.  The suicide prevention education provided includes the following:  Suicide risk factors  Suicide prevention and interventions  National Suicide Hotline telephone number  [facility] assessment telephone number  [facility] Emergency Assistance 911  [facility] and/or [facility]idential Mobile Crisis Unit telephone number  Request made of family/significant other to:  Remove weapons (e.g., guns, rifles, knives), all items previously/currently identified as safety concern.    Remove drugs/medications (over-the-counter, prescriptions, illicit drugs), all items previously/currently identified as a safety concern.  The family member/significant other verbalizes understanding of the suicide prevention education information provided.  The family member/significant other agrees to remove the items of safety concern listed above.  Janann ColonelICKETT JR, GREGORY C 02/18/2014, 3:36 PM

## 2014-02-18 NOTE — Progress Notes (Signed)
Patient ID: Henry [name], male   DOB: 06/01/1981, 33 y.o.   MRN: [id number] D: Pt. Reports depression at "3" of 10. A: Writer introduced self to client, encouraged him to report any concerns, reviewed medication administration schedule. Staff will monitor q4415min for safety, encouraged group. R: pt. Is safe on the unit.

## 2014-02-18 NOTE — Progress Notes (Signed)
Patient ID: Henry [name], male   DOB: 03/02/1981, 33 y.o.   MRN: [id number] He has been up for meal and meds and has not gone to  Groups.  Little interacting with peers and has a irritated attitude toward staff. He has requested and received Imodium once this AM and has not reported any more diarrhea. Self inventory: depression 5, hopelessness 5.withdrawal of diarrhea ( none reported seance 08:21.  Denies SI thoughts . His plan is to stay away from drugs and places and friends that have drugs.

## 2014-02-19 NOTE — Progress Notes (Signed)
Patient ID: Henry [name], male   DOB: 11/02/1980, 33 y.o.   MRN: [id number] D: Pt. In bed eyes closed, respirations even.  A: Writer observed for s/s of distress. Staff will monitor q7015min for safety. R: Pt. Is safe on unit, no distress noted.

## 2014-02-19 NOTE — Progress Notes (Signed)
D.  Pt pleasant on approach, no complaints voiced.  Positive for evening wrap up group with appropriate participation.  Interacting appropriately with peers on unit.  Denies SI/HI/hallucinations at this time.  A.  Support and encouragement offered  R.  Pt remains safe on unit, will continue to monitor.
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
## 2014-02-19 NOTE — [facility] Group Notes (Signed)
[facility]   02/19/2014 10:26 AM  Participation Quality:  Active  Mood/Affect:  Appropriate  Depression Rating:  5   Anxiety Rating:  5  Thoughts of Suicide:  No Will you contract for safety?   Yes  Current AVH:  No  Plan for Discharge/Comments: Patient to attend [facility] on Monday for follow up   Transportation Means: By friend   Supports: Friend   PICKETT JR, Lacora Folmer C

## 2014-02-19 NOTE — BHH Group Notes (Signed)
Adult Psychoeducational Group Note  Date:  02/19/2014 Time:  10:25 PM  Group Topic/Focus:  AA Meeting  Participation Level:  Minimal  Participation Quality:  Attentive  Affect:  Flat  Cognitive:  Appropriate  Insight: Good  Engagement in Group:  Limited  Modes of Intervention:  Discussion and Education  Additional Comments:  Aurther Lofterry introduced himself and gave a brief description of his situation and his plans upon discharge.  Caroll RancherLindsay, Marjette A 02/19/2014, 10:25 PM

## 2014-02-19 NOTE — Progress Notes (Signed)
D: Patient denies SI/HI and A/V hallucinations  A: Monitored q 15 minutes; patient encouraged to attend groups; patient educated about medications; patient given medications per physician orders; patient encouraged to express feelings and/or concerns  R: Patient is isolative; patient is flat, sad, and depressed; patient does not attend groups and lays in the bed all day except he is attending the afternoon group ; patient does not appear to withdrawing; patient is guarded and does not forward information

## 2014-02-19 NOTE — Progress Notes (Signed)
[facility]  02/19/2014 12:23 PM Henry [name]  MRN:  [id number] Subjective:  Patient seen chart reviewed.  Henry [name] continued to endorse lack of energy, feeling tired but he is sleeping better.  He did not go to the groups because he was feeling very tired.  He continued to endorse passive suicidal thoughts and dreams.  He started the new medication yesterday and is hoping that new medicine that helped us depression and suicidal thinking.  He admitted to inability and mood swing .  He has some tremors and shakes .  His appetite is okay.  He was to continue his current psychotropic medication at this time.   Diagnosis:   DSM5: Schizophrenia Disorders:  None Obsessive-Compulsive Disorders:  None Trauma-Stressor Disorders:  None Substance/Addictive Disorders:  Alcohol Related Disorder - Moderate (303.90) and Opioid Disorder - Moderate (304.00)  Benzodiazepine Use Disorder, Cocaine Use Disorder Depressive Disorders:  Major Depressive Disorder - Severe (296.23) Total Time spent with patient: 30 minutes  Axis I: Mood Disorder NOS  ADL's:  Intact  Sleep: Fair  Appetite:  Fair  Suicidal Ideation:  Plan:  denies Intent:  denies Means:  denies Homicidal Ideation:  Plan:  denies Intent:  denies Means:  denies AEB (as evidenced by):  Psychiatric Specialty Exam: Physical Exam  Review of Systems  Constitutional: Negative.   HENT: Negative.   Eyes: Negative.   Respiratory: Negative.   Cardiovascular: Negative.   Gastrointestinal: Negative.   Genitourinary: Negative.   Musculoskeletal: Negative.   Skin: Negative.   Endo/Heme/Allergies: Negative.   Psychiatric/Behavioral: Positive for depression and substance abuse. The patient is nervous/anxious.     Blood pressure 112/74, pulse 106, temperature 98.1 F (36.7 C), temperature source Oral, resp. rate 18, height 6\' 1"  (1.854 m), weight 195 lb (88.451 kg), SpO2 97.00%.Body mass index is 25.73 kg/(m^2).  General Appearance: Fairly  Groomed  Patent attorneyye Contact::  Fair  Speech:  Clear and Coherent  Volume:  flcutuates  Mood:  Anxious, Depressed, Irritable and worried  Affect:  Labile  Thought Process:  Coherent and Goal Directed  Orientation:  Full (Time, Place, and Person)  Thought Content:  symptoms, events at the unit his response, worries, concerns  Suicidal Thoughts:  Yes.  without intent/plan  Homicidal Thoughts:  No  Memory:  Immediate;   Fair Recent;   Fair Remote;   Fair  Judgement:  Fair  Insight:  Present and Shallow  Psychomotor Activity:  Restlessness  Concentration:  Fair  Recall:  F[employer]Fair  Fund of Knowledge:NA  Language: Fair  Akathisia:  No  Handed:    AIMS (if indicated):     Assets:  Desire for Improvement  Sleep:  Number of Hours: 5.5   Musculoskeletal: Strength & Muscle Tone: within normal limits Gait & Station: normal Patient leans: N/A  Current Medications: Current Facility-Administered Medications  Medication Dose Route Frequency Provider Last Rate Last Dose  . acetaminophen (TYLENOL) tablet 650 mg  650 mg Oral Q6H PRN [name], PA-C   650 mg at 02/16/14 0749  . alum & mag hydroxide-simeth (MAALOX/MYLANTA) 200-200-20 MG/5ML suspension 30 mL  30 mL Oral Q4H PRN [name], PA-C      . elvitegravir-cobicistat-emtricitabine-tenofovir (STRIBILD) 150-150-200-300 MG tablet 1 tablet  1 tablet Oral Q breakfast [name], PA-C   1 tablet at 02/19/14 0835  . hydrOXYzine (ATARAX/VISTARIL) tablet 25 mg  25 mg Oral Q6H PRN [name], PA-C   25 mg at 02/16/14 0659  . lurasidone (LATUDA) tablet  40 mg  40 mg Oral QHS Rachael FeeIrving A Lugo, MD   40 mg at 02/18/14 2152  . magnesium hydroxide (MILK OF MAGNESIA) suspension 30 mL  30 mL Oral Daily PRN [name], PA-C      . multivitamin with minerals tablet 1 tablet  1 tablet Oral Daily Rachael FeeIrving A Lugo, MD   1 tablet at 02/19/14 0836  . nicotine (NICODERM CQ - dosed in mg/24 hours) patch 21 mg  21 mg Transdermal Daily Rachael FeeIrving A Lugo, MD   21  mg at 02/19/14 0837  . thiamine (VITAMIN B-1) tablet 100 mg  100 mg Oral Daily Rachael FeeIrving A Lugo, MD   100 mg at 02/19/14 0835  . traZODone (DESYREL) tablet 50 mg  50 mg Oral QHS,MR X 1 [name], PA-C   50 mg at 02/18/14 2307    Lab Results: No results found for this or any previous visit (from the past 48 hour(s)).  Physical Findings: AIMS: Facial and Oral Movements Muscles of Facial Expression: None, normal Lips and Perioral Area: None, normal Jaw: None, normal Tongue: None, normal,Extremity Movements Upper (arms, wrists, hands, fingers): None, normal Lower (legs, knees, ankles, toes): None, normal, Trunk Movements Neck, shoulders, hips: None, normal, Overall Severity Severity of abnormal movements (highest score from questions above): None, normal Incapacitation due to abnormal movements: None, normal Patient's awareness of abnormal movements (rate only patient's report): No Awareness, Dental Status Current problems with teeth and/or dentures?: No Does patient usually wear dentures?: No  CIWA:  CIWA-Ar Total: 0 COWS:  COWS Total Score: 1  Treatment Plan Summary: Daily contact with patient to assess and evaluate symptoms and progress in treatment Medication management  Plan: Supportive approach/coping skills/relapse prevention, continue Latuda which was started yesterday.  Continue all his other psychotropic medication.  Encouraged to participate in groups.  Medical Decision Making Problem Points:  Established problem, worsening (2) and Review of psycho-social stressors (1) Data Points:  Review of new medications or change in dosage (2)  I certify that inpatient services furnished can reasonably be expected to improve the patient's condition.   ARFEEN,SYED T. 02/19/2014, 12:23 PM

## 2014-02-19 NOTE — Tx Team (Signed)
Interdisciplinary Treatment Plan Update (Adult)  Date: 02/19/2014   Time Reviewed: 9:40 AM  Progress in Treatment:  Attending groups: Yes Participating in groups:  Yes  Taking medication as prescribed: Yes  Tolerating medication: Yes  Family/Significant othe contact made: SPE completed  Patient understands diagnosis: Yes, AEB seeking treatment for ETOH detox/crack cocaine abuse, mood stabilization, SI, and med management.  Discussing patient identified problems/goals with staff: Yes  Medical problems stabilized or resolved: Yes  Denies suicidal/homicidal ideation: Yes during self report.  Patient has not harmed self or Others: Yes  New problem(s) identified:  Discharge Plan or Barriers: [facility] for screening.  Additional comments: Patient is a 33 year old male admitted [facility] from [facility]. Patient came in with depression and suicidal thoughts, with no plan, after an increasing amount of substance abuse. Patient reports that he drinks 80 oz of alcohol a day and smoke "large amounts" of crack cocaine daily. Patient stated that he also gets xanax, percocet, valium, and oxycodone of the street when he can. Patient stated that he is depressed and having suicidal thoughts, patient contracts for safety. Patient denied homicidal thoughts, denied A/V/H. Patient stated that he does not have a good relationship with his family and is homeless at this point in time. Patient given unit policies and procedures. Patient expressed understanding. Patient oriented to unit, given fluids and food. Will continue to monitor.     Reason for Continuation of Hospitalization: Librium taper Med management Mood stabilization Estimated length of stay: 3-5 days  For review of initial/current patient goals, please see plan of care.  Attendees:  Patient:    Family:    Physician: Dr. Lolly MustacheArfeen 02/19/2014 9:40 AM   Nursing: Cala BradfordKimberly RN  02/19/2014 9:40 AM   Clinical  Social Worker Janann ColonelGregory Pickett Jr., LCSW 02/19/2014 9:40 AM   Other: Macon LargeKimberley RN  02/19/2014 9:40 AM   Other: Brayton ElBritney RN 02/19/2014 9:40 AM   Other: Chandra BatchAggie N. PA 02/19/2014 9:40 AM   Other:    Scribe for Treatment Team:  Janann ColonelGregory Pickett Jr., LCSW 02/19/2014 9:40 AM

## 2014-02-20 MED ORDER — LURASIDONE HCL 40 MG PO TABS
40.0000 mg | ORAL_TABLET | Freq: Every day | ORAL | Status: DC
  Filled 2014-02-20: qty 14

## 2014-02-20 MED ORDER — TRAZODONE HCL 50 MG PO TABS
50.0000 mg | ORAL_TABLET | Freq: Every evening | ORAL | Status: DC | PRN
  Filled 2014-02-20 (×2): qty 28

## 2014-02-20 MED ORDER — RISAQUAD PO CAPS
1.0000 | ORAL_CAPSULE | Freq: Every day | ORAL | Status: DC
  Administered 2014-02-20 – 2014-02-21 (×2): 1 via ORAL
  Filled 2014-02-20 (×3): qty 1
  Filled 2014-02-20: qty 14
  Filled 2014-02-20: qty 1
  Filled 2014-02-20: qty 14

## 2014-02-20 MED ORDER — LOPERAMIDE HCL 2 MG PO CAPS
2.0000 mg | ORAL_CAPSULE | ORAL | Status: DC | PRN
  Administered 2014-02-20: 4 mg via ORAL
  Administered 2014-02-20: 2 mg via ORAL
  Filled 2014-02-20: qty 1
  Filled 2014-02-20: qty 2
  Filled 2014-02-20: qty 20

## 2014-02-20 NOTE — [facility] Group Notes (Signed)
[facility] Group Notes:  (Nursing/MHT/Case Management/Adjunct)  Date:  02/20/2014  Time:  1:52 PM  Type of Therapy:  Psychoeducational Skills  Participation Level:  Active  Participation Quality:  Appropriate  Affect:  Appropriate  Cognitive:  Appropriate  Insight:  Appropriate  Engagement in Group:  Engaged  Modes of Intervention:  Discussion  Summary of Progress/Problems: Pt did attend self inventory group, pt reported that he was negative SI/HI, no AH/VH noted. Pt rated his depression as a 6, and his helplessness/hopelessness as a 0.     Pt reported concerns stomach burning [facility], pt advised that the doctor will be made aware.   Jacquelyne BalintForrest, Sanika Brosious Shanta 02/20/2014, 1:52 PM

## 2014-02-20 NOTE — [facility] Group Notes (Signed)
[facility] Group Notes:  (Nursing/MHT/Case Management/Adjunct)  Date:  02/20/2014  Time:  4:15 PM  Type of Therapy:  Psychoeducational Skills  Participation Level:  Minimal  Participation Quality:  Appropriate  Affect:  Appropriate  Cognitive:  Appropriate  Insight:  Improving  Engagement in Group:  Poor  Modes of Intervention:  Discussion  Summary of Progress/Problems: pt attended group, but did not really participate. Pt was mainly having side conversations and braiding hair.  Heriberto Antiguaerry, Zalayah Pizzuto M 02/20/2014, 4:15 PM

## 2014-02-20 NOTE — [facility] Group Notes (Signed)
[facility] Group Notes: (Clinical Social Work)   02/20/2014      Type of Therapy:  Group Therapy   Participation Level:  Did Not Attend    Mareida Grossman-Orr, LCSW 02/20/2014, 11:10 AM     

## 2014-02-20 NOTE — Progress Notes (Addendum)
[facility]  02/20/2014 3:03 PM Henry [name]  MRN:  [id number] Subjective:  Patient seen chart reviewed. Henry [name] states he feels good, and is not feeling hopeless right now however he just wants to be accepted. He is actively participating in most groups and recreational therapy.  He started JordanLatuda, and reports an increase in diarrhea. However he can not associate the diarrhea from s/e from medication, withdrawal symptoms, or acute stress reaction.  His appetite is okay. He spoke with his mother and she is going to get his medications so that [facility] will be [facility]. [facility], and get his life together so that he may live a better life. Denies SI/HI/AVH.    Diagnosis:   DSM5: Schizophrenia Disorders:  None Obsessive-Compulsive Disorders:  None Trauma-Stressor Disorders:  None Substance/Addictive Disorders:  Alcohol Related Disorder - Moderate (303.90) and Opioid Disorder - Moderate (304.00)  Benzodiazepine Use Disorder, Cocaine Use Disorder Depressive Disorders:  Major Depressive Disorder - Severe (296.23) Total Time spent with patient: 30 minutes  Axis I: Mood Disorder NOS  ADL's:  Intact  Sleep: Fair  Appetite:  Fair  Suicidal Ideation:  Plan:  denies Intent:  denies Means:  denies Homicidal Ideation:  Plan:  denies Intent:  denies Means:  denies AEB (as evidenced by):  Psychiatric Specialty Exam: Physical Exam   Review of Systems  Constitutional: Negative.   HENT: Negative.   Eyes: Negative.   Respiratory: Negative.   Cardiovascular: Negative.   Gastrointestinal: Positive for heartburn, nausea, abdominal pain and diarrhea.  Genitourinary: Negative.   Musculoskeletal: Negative.   Skin: Negative.   Endo/Heme/Allergies: Negative.   Psychiatric/Behavioral: Positive for depression and substance abuse. The patient is nervous/anxious.     Blood pressure 125/78, pulse 97, temperature 98.3 F (36.8 C),  temperature source Oral, resp. rate 20, height 6\' 1"  (1.854 m), weight 88.451 kg (195 lb), SpO2 97.00%.Body mass index is 25.73 kg/(m^2).  General Appearance: Fairly Groomed  Patent attorneyye Contact::  Fair  Speech:  Clear and Coherent  Volume:  Normal  Mood:  Anxious and Depressed  Affect:  Labile  Thought Process:  Coherent and Goal Directed  Orientation:  Full (Time, Place, and Person)  Thought Content:  symptoms, events at the unit his response, worries, concerns  Suicidal Thoughts:  Yes.  without intent/plan  Homicidal Thoughts:  No  Memory:  Immediate;   Fair Recent;   Fair Remote;   Fair  Judgement:  Fair  Insight:  Present and Shallow  Psychomotor Activity:  Restlessness  Concentration:  Fair  Recall:  F[employer]Fair  Fund of Knowledge:NA  Language: Fair  Akathisia:  No  Handed:    AIMS (if indicated):     Assets:  Desire for Improvement  Sleep:  Number of Hours: 6.5   Musculoskeletal: Strength & Muscle Tone: within normal limits Gait & Station: normal Patient leans: N/A  Current Medications: Current Facility-Administered Medications  Medication Dose Route Frequency Provider Last Rate Last Dose  . acetaminophen (TYLENOL) tablet 650 mg  650 mg Oral Q6H PRN [name], PA-C   650 mg at 02/16/14 0749  . alum & mag hydroxide-simeth (MAALOX/MYLANTA) 200-200-20 MG/5ML suspension 30 mL  30 mL Oral Q4H PRN [name], PA-C      . elvitegravir-cobicistat-emtricitabine-tenofovir (STRIBILD) 150-150-200-300 MG tablet 1 tablet  1 tablet Oral Q breakfast [name], PA-C   1 tablet at 02/20/14 0848  . hydrOXYzine (ATARAX/VISTARIL) tablet 25 mg  25 mg Oral Q6H PRN [name], PA-C   25 mg at 02/20/14 1048  . loperamide (IMODIUM) capsule 2-4 mg  2-4 mg Oral PRN Truman Haywardakia S Starkes, FNP   2 mg at 02/20/14 1307  . lurasidone (LATUDA) tablet 40 mg  40 mg Oral QHS Rachael FeeIrving A Lugo, MD   40 mg at 02/19/14 2233  . magnesium hydroxide (MILK OF MAGNESIA) suspension 30 mL  30 mL Oral Daily PRN  [name], PA-C      . multivitamin with minerals tablet 1 tablet  1 tablet Oral Daily Rachael FeeIrving A Lugo, MD   1 tablet at 02/20/14 0848  . nicotine (NICODERM CQ - dosed in mg/24 hours) patch 21 mg  21 mg Transdermal Daily Rachael FeeIrving A Lugo, MD   21 mg at 02/20/14 1307  . thiamine (VITAMIN B-1) tablet 100 mg  100 mg Oral Daily Rachael FeeIrving A Lugo, MD   100 mg at 02/20/14 0848  . traZODone (DESYREL) tablet 50 mg  50 mg Oral QHS,MR X 1 [name], PA-C   50 mg at 02/19/14 2233    Lab Results: No results found for this or any previous visit (from the past 48 hour(s)).  Physical Findings: AIMS: Facial and Oral Movements Muscles of Facial Expression: None, normal Lips and Perioral Area: None, normal Jaw: None, normal Tongue: None, normal,Extremity Movements Upper (arms, wrists, hands, fingers): None, normal Lower (legs, knees, ankles, toes): None, normal, Trunk Movements Neck, shoulders, hips: None, normal, Overall Severity Severity of abnormal movements (highest score from questions above): None, normal Incapacitation due to abnormal movements: None, normal Patient's awareness of abnormal movements (rate only patient's report): No Awareness, Dental Status Current problems with teeth and/or dentures?: No Does patient usually wear dentures?: No  CIWA:  CIWA-Ar Total: 4 COWS:  COWS Total Score: 1  Treatment Plan Summary: Daily contact with patient to assess and evaluate symptoms and progress in treatment Medication management  Plan:  Supportive approach/coping skills/relapse prevention Continue Latuda which was started yesterday.   Continue all his other psychotropic medication.   Encouraged to participate in groups.  Will start probiotic for diarrhea symptoms.   Medical Decision Making Problem Points:  Established problem, worsening (2) and Review of psycho-social stressors (1) Data Points:  Review of new medications or change in dosage (2)  I certify that inpatient services furnished  can reasonably be expected to improve the patient's condition.   Malachy ChamberSTARKES, TAKIA S FNP-BC 02/20/2014, 3:03 PM  Reviewed the information documented and agree with the treatment plan.  JONNALAGADDA,JANARDHAHA R. 02/23/2014 9:46 AM

## 2014-02-20 NOTE — Progress Notes (Signed)
Patient ID: Henry [name] XXXDabbs, male   DOB: 06/28/1981, 33 y.o.   MRN: [id number]   D: Pt has been very flat and depressed on the unit today, pt reported that he has no support system and has no place to live. Pt reported that he has been living house to house, and that at each location he has had to deal with being treated like trash. Pt reported that once he is discharge [facility] he will be go to a place where he is treated with respect. Pt has attended all groups and has engaged in treatment. Pt reported being negative SI/HI, no AH/VH noted. A: 15 min checks continued for patient safety. R: Pt safety maintained.

## 2014-02-20 NOTE — Progress Notes (Signed)
D.  Pt pleasant on approach, denies complaints at this time.  Pt WILL be needing samples of his medication at discharge.  He had apparently told Michelle Nasutilena earlier that his mom would pick up his medications, but upon speaking with him tonight he clarified that he definitely will need the samples at discharge.  Denies SI/HI/hallucinations at this time.  Interacting appropriately with peers on the unit, positive for evening AA group.  A.  Support and encouragement offered, called pharmacy and left message regarding samples but will also report this off to nurse in AM.  R. Pt remains safe on unit, will continue to monitor.

## 2014-02-20 NOTE — Progress Notes (Signed)
Patient did attend the evening speaker AA meeting.  

## 2014-02-20 NOTE — Progress Notes (Signed)
Adult Psychoeducational Group Note  Date:  02/20/2014 Time:  3:36 PM  Group Topic/Focus:  Therapeutic Activity  Participation Level:  Did Not Attend  Elijio MilesMercer, Brittany N 02/20/2014, 3:36 PM

## 2014-02-21 MED ORDER — NICOTINE 21 MG/24HR TD PT24: 21.0000 mg | MEDICATED_PATCH | Freq: Every day | TRANSDERMAL | Status: DC

## 2014-02-21 MED ORDER — LOPERAMIDE HCL 2 MG PO CAPS: 2.0000 mg | ORAL_CAPSULE | ORAL | Status: DC | PRN

## 2014-02-21 MED ORDER — LURASIDONE HCL 40 MG PO TABS: 40.0000 mg | ORAL_TABLET | Freq: Every day | ORAL | Status: DC

## 2014-02-21 MED ORDER — HYDROXYZINE HCL 25 MG PO TABS: 25.0000 mg | ORAL_TABLET | Freq: Four times a day (QID) | ORAL | Status: DC | PRN

## 2014-02-21 MED ORDER — TRAZODONE HCL 50 MG PO TABS: 50.0000 mg | ORAL_TABLET | Freq: Every evening | ORAL | Status: DC | PRN

## 2014-02-21 MED ORDER — ADULT MULTIVITAMIN W/MINERALS CH: 1.0000 | ORAL_TABLET | Freq: Every day | ORAL | Status: DC

## 2014-02-21 MED ORDER — RISAQUAD PO CAPS: 1.0000 | ORAL_CAPSULE | Freq: Every day | ORAL | Status: DC

## 2014-02-21 NOTE — Progress Notes (Signed)
Patient ID: Henry [name], male   DOB: 07/20/1981, 33 y.o.   MRN: [id number] D: [facility]  He was informed he will get samples of all his meds except for his HIV medication.  He states that he has a prescription for same.  He also is requesting nicotine patches.  Patient reports decreased depressive symptoms.  He rates his depression as a 2; his hopelessness as a 1.  He states he is sleeping and eating well.  He denies any SI/HI/AVH. His goal is "to stay away from drugs and where they are."  He is looking forward to "getting back on my feet."  A: Informed nurse practitioner of need for discharge instructions and order for medication samples.  Safety check completed every 15 minutes per protocol.  R: Patient is pleasant and cooperative.

## 2014-02-21 NOTE — [facility] Group Notes (Signed)
[facility] Group Notes:  (Clinical Social Work)  02/21/2014  10:00-11:00AM  Summary of Progress/Problems:   The main focus of today's process group was to   identify the patient's current support system and decide on other supports that can be put in place.  The picture on workbook was used to discuss why additional supports are needed.  An emphasis was placed on using counselor, doctor, therapy groups, 12-step groups, and problem-specific support groups to expand supports.   There was also an extensive discussion about what constitutes a healthy support versus an unhealthy support.  The patient expressed full comprehension of the concepts presented, and agreed that there is a need to add more supports.  The patient stated the current supports in place are his mother, uncle, sister, God and his friends here at the hospital.  He made a lengthy statement about believing in God and encouraged others to join him in that.  He did not contribute much to the rest of group and did leave early.  Type of Therapy:  Process Group with Motivational Interviewing  Participation Level:  Active  Participation Quality:  Attentive and Sharing  Affect:  Anxious  Cognitive:  Alert  Insight:  Developing/Improving  Engagement in Therapy:  Improving  Modes of Intervention:   Education, Support and Processing, Activity  Ambrose MantleMareida Grossman-Orr, LCSW 02/21/2014, 12:15pm

## 2014-02-21 NOTE — Progress Notes (Signed)
Patient did attend the evening speaker AA meeting.  

## 2014-02-21 NOTE — Progress Notes (Addendum)
[facility]  02/21/2014 11:27 AM [name]  MRN:  [id number]  Subjective:  Patient seen chart reviewed. Henry [name] states he feels good, and is not feeling hopeless right now however he just wants to be accepted. He is actively participating in most groups and recreational therapy.  He started JordanLatuda, and reports an increase in diarrhea. However he reports cessation of the diarrhea with the probiotic. His appetite is okay. He spoke with his mother and she is going to get his medications so that he will be able to [facility]. Upon discharge he plans to [facility], and get his life together so that he may live a better "beautiful" life. [place].   He is actively participating in group however he states he was not accepted in this morning's group. He states he was not allowed to speak in the group"everytime I said something she would say wait and hold on" Currently he rated his depression at 1/10, and anxiety at 6/10.When talking with his peers he has discovered that JordanLatuda works well with Buspar, and is inquiring about this.  Denies SI/HI/AVH.   Diagnosis:   DSM5: Schizophrenia Disorders:  None Obsessive-Compulsive Disorders:  None Trauma-Stressor Disorders:  None Substance/Addictive Disorders:  Alcohol Related Disorder - Moderate (303.90) and Opioid Disorder - Moderate (304.00)  Benzodiazepine Use Disorder, Cocaine Use Disorder Depressive Disorders:  Major Depressive Disorder - Severe (296.23) Total Time spent with patient: 30 minutes  Axis I: Mood Disorder NOS  ADL's:  Intact  Sleep: Fair  Appetite:  Fair  Suicidal Ideation:  Plan:  denies Intent:  denies Means:  denies Homicidal Ideation:  Plan:  denies Intent:  denies Means:  denies AEB (as evidenced by):  Psychiatric Specialty Exam: Physical Exam  Constitutional: He is oriented to person, place, and time. He appears well-developed.  Neck: Normal  range of motion.  Neurological: He is alert and oriented to person, place, and time.  Skin: Skin is warm and dry.    Review of Systems  Constitutional: Negative.   HENT: Negative.   Eyes: Negative.   Respiratory: Negative.   Cardiovascular: Negative.   Gastrointestinal: Positive for heartburn, nausea, abdominal pain and diarrhea.  Genitourinary: Negative.   Musculoskeletal: Negative.   Skin: Negative.   Endo/Heme/Allergies: Negative.   Psychiatric/Behavioral: Positive for depression and substance abuse. The patient is nervous/anxious.     Blood pressure 112/79, pulse 103, temperature 98.2 F (36.8 C), temperature source Oral, resp. rate 16, height 6\' 1"  (1.854 m), weight 88.451 kg (195 lb), SpO2 97.00%.Body mass index is 25.73 kg/(m^2).  General Appearance: Fairly Groomed  Patent attorneyye Contact::  Good  Speech:  Clear and Coherent  Volume:  Normal  Mood:  Euphoric  Affect:  Appropriate and Congruent  Thought Process:  Coherent and Goal Directed  Orientation:  Full (Time, Place, and Person)  Thought Content:  WDL and symptoms, events at the unit his response, worries, concerns  Suicidal Thoughts:  No  Homicidal Thoughts:  No  Memory:  Immediate;   Fair Recent;   Fair Remote;   Fair  Judgement:  Fair  Insight:  Present and Shallow  Psychomotor Activity:  Restlessness  Concentration:  Fair  Recall:  FiservFair  Fund of Knowledge:NA  Language: Fair  Akathisia:  No  Handed:    AIMS (if indicated):     Assets:  Desire for Improvement  Sleep:  Number of Hours: 5.5   Musculoskeletal: Strength &  Muscle Tone: within normal limits Gait & Station: normal Patient leans: N/A  Current Medications: Current Facility-Administered Medications  Medication Dose Route Frequency Provider Last Rate Last Dose  . acetaminophen (TYLENOL) tablet 650 mg  650 mg Oral Q6H PRN [name], PA-C   650 mg at 02/16/14 0749  . acidophilus (RISAQUAD) capsule 1 capsule  1 capsule Oral Daily Truman Haywardakia S Starkes, FNP    1 capsule at 02/21/14 0827  . alum & mag hydroxide-simeth (MAALOX/MYLANTA) 200-200-20 MG/5ML suspension 30 mL  30 mL Oral Q4H PRN [name], PA-C      . elvitegravir-cobicistat-emtricitabine-tenofovir (STRIBILD) 150-150-200-300 MG tablet 1 tablet  1 tablet Oral Q breakfast [name], PA-C   1 tablet at 02/21/14 0827  . hydrOXYzine (ATARAX/VISTARIL) tablet 25 mg  25 mg Oral Q6H PRN [name], PA-C   25 mg at 02/20/14 2227  . loperamide (IMODIUM) capsule 2-4 mg  2-4 mg Oral PRN Truman Haywardakia S Starkes, FNP   2 mg at 02/20/14 1307  . lurasidone (LATUDA) tablet 40 mg  40 mg Oral QHS Rachael FeeIrving A Lugo, MD   40 mg at 02/20/14 2227  . magnesium hydroxide (MILK OF MAGNESIA) suspension 30 mL  30 mL Oral Daily PRN [name], PA-C      . multivitamin with minerals tablet 1 tablet  1 tablet Oral Daily Rachael FeeIrving A Lugo, MD   1 tablet at 02/21/14 0827  . nicotine (NICODERM CQ - dosed in mg/24 hours) patch 21 mg  21 mg Transdermal Daily Rachael FeeIrving A Lugo, MD   21 mg at 02/21/14 0830  . thiamine (VITAMIN B-1) tablet 100 mg  100 mg Oral Daily Rachael FeeIrving A Lugo, MD   100 mg at 02/21/14 0827  . traZODone (DESYREL) tablet 50 mg  50 mg Oral QHS,MR X 1 [name], PA-C   50 mg at 02/20/14 2227    Lab Results: No results found for this or any previous visit (from the past 48 hour(s)).  Physical Findings: AIMS: Facial and Oral Movements Muscles of Facial Expression: None, normal Lips and Perioral Area: None, normal Jaw: None, normal Tongue: None, normal,Extremity Movements Upper (arms, wrists, hands, fingers): None, normal Lower (legs, knees, ankles, toes): None, normal, Trunk Movements Neck, shoulders, hips: None, normal, Overall Severity Severity of abnormal movements (highest score from questions above): None, normal Incapacitation due to abnormal movements: None, normal Patient's awareness of abnormal movements (rate only patient's report): No Awareness, Dental Status Current problems with teeth  and/or dentures?: No Does patient usually wear dentures?: No  CIWA:  CIWA-Ar Total: 4 COWS:  COWS Total Score: 1  Treatment Plan Summary: Daily contact with patient to assess and evaluate symptoms and progress in treatment Medication management  Plan: Supportive approach/coping skills/relapse prevention,Continue Latuda which was started yesterday.  Continue all his other psychotropic medication.  Encouraged to participate in groups. Will continue probiotic for diarrhea symptoms. [facility] will refrain from adding additional therapy at this time, considering the need for medication evaluation will not be available.   Medical Decision Making Problem Points:  Established problem, worsening (2) and Review of psycho-social stressors (1) Data Points:  Review of new medications or change in dosage (2)  I certify that inpatient services furnished can reasonably be expected to improve the patient's condition.   Malachy ChamberSTARKES, TAKIA S FNP-BC 02/21/2014, 11:27 AM  Reviewed the information documented and agree with the treatment plan.  Adalind Weitz,JANARDHAHA R. 02/23/2014 9:50 AM

## 2014-02-21 NOTE — B[facility] Group Notes (Signed)
B[facility] Group Notes:  (Nursing/MHT/Case Management/Adjunct)  Date:  02/21/2014  Time:  0900 am  Type of Therapy: Self Inventory Group  Participation Level:  Active  Participation Quality:  Appropriate and Attentive  Affect:  Appropriate  Cognitive:  Alert and Appropriate  Insight:  Appropriate  Engagement in Group:  Engaged  Modes of Intervention:  Support  Summary of Progress/Problems: patient states he would like a mood stabilizer such as Latuda.  Also wanted buspar for anxiety.  Patient is due to [facility] tomorrow at 0600 [facility].  Informed patient it was probably too late to receive meds such as these as they take to work.    Cranford MonBeaudry, Caroline Evans 02/21/2014, 10:07 AM

## 2014-02-21 NOTE — Progress Notes (Signed)
MEDICATION RELATED CONSULT NOTE   Pharmacy:  Re:  Medication supply for [facility]   Patient was informed to bring in home supply of Stribild.  Patient needs 14 days of medications [facility].  Pharmacy will provide 14 days supply of other medications on discharge.     Plan:  Patient stated that the Stribild will be brought in for him on Monday morning.  He stated that he understood that he will need this medicine to be accepted [facility].    Charyl Dancerayne, Elena Marie 02/21/2014,8:41 AM
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
## 2014-02-21 NOTE — Progress Notes (Signed)
D.  Pt pleasant on approach, requests to be awakened at 0500 in the morning since he will be [facility].  Pt states that he does have a ride in the morning.  Pt denies SI/HI/hallucinations at this time.  Positive for evening AA group, interacting appropriately with peers on the unit.  A.  Support and encouragement offered  R.  Pt remains safe on unit, will continue to monitor.

## 2014-02-22 NOTE — Progress Notes (Signed)
D.  Pt discharged this [facility]  Pt collected all belongings from room as well as locker 48.  Pt received his sample medications, discharge papers, and facesheet to [facility].  Pt denied suicidal ideation, HI/hallucinations of any kind.  Pt pleasant and pleased to be [facility].  Pt escorted to lobby, breakfast tray obtained for him.  Pt's ride was with Pt when tray taken out to lobby for Pt.  Pt denied any further questions.

## 2014-02-22 NOTE — Progress Notes (Signed)
[facility] :  Will you be returning to the same living situation after discharge: No. [facility]  At discharge, do you have transportation home?:Yes,  by bus  Do you have the ability to pay for your medications:Yes,  no barriers  Release of information consent forms completed and in the chart;  Patient's signature needed at discharge.  Patient to Follow up at: Follow-up Information   [facility] On 02/22/2014. (Screening and possible admission on this date. Make sure to arrive promptly at 8AM for screening and bring all meds, clothing, and ID (provided in chart).)    Contact information:   [address]  [place], [place] [address] Phone: [phone] Fax: [phone]      Follow up with [facility]. Environmental manager(Contact your case worker when in treatment to schedule next visit. )    Contact information:   [address] [place], [place] [address] Phone: [phone] Fax: [phone]      Patient denies SI/HI:   Yes,  patient denies    Safety Planning and Suicide Prevention discussed:  Yes,  with patient and friend  Haskel KhanICKETT JR, GREGORY C 02/22/2014, 11:03 AM

## 2014-02-24 NOTE — Progress Notes (Signed)
Patient Discharge Instructions:  After Visit Summary (AVS):   Faxed to:  02/24/14 Psychiatric Admission Assessment Note:   Faxed to:  02/24/14 Suicide Risk Assessment - Discharge Assessment:   Faxed to:  02/24/14 Faxed/Sent to the Next Level Care provider:  02/24/14 Faxed to [facility] @ [phone] [facility] per the SW.  Jerelene ReddenSheena E [place], 02/24/2014, 2:26 PM
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
## 2014-03-08 ENCOUNTER — Ambulatory Visit (INDEPENDENT_AMBULATORY_CARE_PROVIDER_SITE_OTHER): Payer: Self-pay | Admitting: Internal Medicine

## 2014-03-08 ENCOUNTER — Encounter: Payer: Self-pay | Admitting: Internal Medicine

## 2014-03-08 VITALS — BP 161/93 | HR 98 | Temp 99.0°F | Wt 196.0 lb

## 2014-03-08 DIAGNOSIS — B20 Human immunodeficiency virus [HIV] disease: Secondary | ICD-10-CM

## 2014-03-08 NOTE — Progress Notes (Signed)
   Subjective:    Patient ID: Henry [name], male    DOB: 03/20/1981, 33 y.o.   MRN: [id number]  HPI    Review of Systems     Objective:   Physical Exam        Assessment & Plan:

## 2014-03-08 NOTE — Assessment & Plan Note (Signed)
Doing great.  RTC 3 months if labs today reassuring.

## 2014-03-08 NOTE — Progress Notes (Signed)
   Subjective:    Patient ID: Justus Memoryerry Yaney, male    DOB: 06/26/1981, 33 y.o.   MRN: [id number]  HPI  Here for a follow up visit.  Was last seen in February on Stribild which he started several weeks before.  Then in June [facility] for SI, drug and alcohol abuse.  Taking his medications daily with no missed doses.  He was [facility] yesterday [facility].  No weight loss, no diarreha.    Review of Systems  Constitutional: Negative for fever and fatigue.  Respiratory: Negative for shortness of breath.   Gastrointestinal: Negative for nausea and diarrhea.  Psychiatric/Behavioral: Negative for dysphoric mood.       Objective:   Physical Exam  Constitutional: He appears well-developed and well-nourished. No distress.  HENT:  Mouth/Throat: No oropharyngeal exudate.  Eyes: No scleral icterus.  Cardiovascular: Normal rate, regular rhythm and normal heart sounds.   No murmur heard. Pulmonary/Chest: Effort normal and breath sounds normal. No respiratory distress. He has no wheezes.  Lymphadenopathy:    He has no cervical adenopathy.  Skin: No rash noted.          Assessment & Plan:

## 2014-03-09 LAB — CBC WITH DIFFERENTIAL/PLATELET
Basophils Absolute: 0 10*3/uL (ref 0.0–0.1)
Basophils Relative: 0 % (ref 0–1)
Eosinophils Absolute: 0.1 10*3/uL (ref 0.0–0.7)
Eosinophils Relative: 2 % (ref 0–5)
HEMATOCRIT: 43.3 % (ref 39.0–52.0)
HEMOGLOBIN: 15.5 g/dL (ref 13.0–17.0)
LYMPHS ABS: 1.5 10*3/uL (ref 0.7–4.0)
Lymphocytes Relative: 28 % (ref 12–46)
MCH: 33 pg (ref 26.0–34.0)
MCHC: 35.8 g/dL (ref 30.0–36.0)
MCV: 92.3 fL (ref 78.0–100.0)
Monocytes Absolute: 0.3 10*3/uL (ref 0.1–1.0)
Monocytes Relative: 6 % (ref 3–12)
NEUTROS PCT: 64 % (ref 43–77)
Neutro Abs: 3.4 10*3/uL (ref 1.7–7.7)
Platelets: 202 10*3/uL (ref 150–400)
RBC: 4.69 MIL/uL (ref 4.22–5.81)
RDW: 13.3 % (ref 11.5–15.5)
WBC: 5.3 10*3/uL (ref 4.0–10.5)

## 2014-03-09 LAB — COMPLETE METABOLIC PANEL WITH GFR
ALK PHOS: 52 U/L (ref 39–117)
ALT: 22 U/L (ref 0–53)
AST: 30 U/L (ref 0–37)
Albumin: 4.3 g/dL (ref 3.5–5.2)
BILIRUBIN TOTAL: 0.5 mg/dL (ref 0.2–1.2)
BUN: 9 mg/dL (ref 6–23)
CO2: 30 mEq/L (ref 19–32)
CREATININE: 1.05 mg/dL (ref 0.50–1.35)
Calcium: 9.2 mg/dL (ref 8.4–10.5)
Chloride: 103 mEq/L (ref 96–112)
GFR, Est Non African American: >89 mL/min
Glucose, Bld: 93 mg/dL (ref 70–99)
Potassium: 3.8 mEq/L (ref 3.5–5.3)
Sodium: 140 mEq/L (ref 135–145)
Total Protein: 6.7 g/dL (ref 6.0–8.3)

## 2014-03-09 LAB — T-HELPER CELL (CD4) - (RCID CLINIC ONLY)
CD4 % Helper T Cell: 24 % — ABNORMAL LOW (ref 33–55)
CD4 T CELL ABS: 360 /uL — AB (ref 400–2700)

## 2014-03-09 LAB — HIV-1 RNA QUANT-NO REFLEX-BLD

## 2014-03-11 ENCOUNTER — Other Ambulatory Visit: Payer: Self-pay | Admitting: Internal Medicine

## 2014-03-11 DIAGNOSIS — B20 Human immunodeficiency virus [HIV] disease: Secondary | ICD-10-CM

## 2014-06-08 ENCOUNTER — Ambulatory Visit: Payer: Self-pay

## 2014-06-17 ENCOUNTER — Ambulatory Visit: Payer: Self-pay | Admitting: Internal Medicine

## 2014-09-03 ENCOUNTER — Emergency Department (HOSPITAL_COMMUNITY)
Admission: EM | Admit: 2014-09-03 | Discharge: 2014-09-03 | Disposition: A | Discharge status: Other Acute Inpatient Hospital | Payer: Self-pay | Attending: Emergency Medicine | Admitting: Emergency Medicine

## 2014-09-03 ENCOUNTER — Inpatient Hospital Stay (HOSPITAL_COMMUNITY)
Admission: AD | Admit: 2014-09-03 | Discharge: 2014-09-09 | DRG: 896 | Disposition: A | Discharge status: Home / Self Care | Payer: Federal, State, Local not specified - Other | Source: Intra-hospital | Attending: Psychiatry | Admitting: Psychiatry

## 2014-09-03 ENCOUNTER — Encounter (HOSPITAL_COMMUNITY): Payer: Self-pay

## 2014-09-03 ENCOUNTER — Encounter (HOSPITAL_COMMUNITY): Payer: Self-pay | Admitting: Physical Medicine and Rehabilitation

## 2014-09-03 DIAGNOSIS — F1020 Alcohol dependence, uncomplicated: Secondary | ICD-10-CM | POA: Insufficient documentation

## 2014-09-03 DIAGNOSIS — F331 Major depressive disorder, recurrent, moderate: Secondary | ICD-10-CM | POA: Diagnosis present

## 2014-09-03 DIAGNOSIS — F4310 Post-traumatic stress disorder, unspecified: Secondary | ICD-10-CM | POA: Diagnosis present

## 2014-09-03 DIAGNOSIS — R45851 Suicidal ideations: Secondary | ICD-10-CM | POA: Diagnosis present

## 2014-09-03 DIAGNOSIS — F1520 Other stimulant dependence, uncomplicated: Secondary | ICD-10-CM | POA: Insufficient documentation

## 2014-09-03 DIAGNOSIS — F1120 Opioid dependence, uncomplicated: Secondary | ICD-10-CM | POA: Insufficient documentation

## 2014-09-03 DIAGNOSIS — G4700 Insomnia, unspecified: Secondary | ICD-10-CM | POA: Diagnosis present

## 2014-09-03 DIAGNOSIS — F1910 Other psychoactive substance abuse, uncomplicated: Secondary | ICD-10-CM

## 2014-09-03 DIAGNOSIS — F411 Generalized anxiety disorder: Secondary | ICD-10-CM | POA: Diagnosis present

## 2014-09-03 DIAGNOSIS — Z609 Problem related to social environment, unspecified: Secondary | ICD-10-CM | POA: Diagnosis present

## 2014-09-03 DIAGNOSIS — Z720 Tobacco use: Secondary | ICD-10-CM | POA: Insufficient documentation

## 2014-09-03 DIAGNOSIS — F1010 Alcohol abuse, uncomplicated: Secondary | ICD-10-CM | POA: Diagnosis present

## 2014-09-03 DIAGNOSIS — Z8249 Family history of ischemic heart disease and other diseases of the circulatory system: Secondary | ICD-10-CM

## 2014-09-03 DIAGNOSIS — G4710 Hypersomnia, unspecified: Secondary | ICD-10-CM | POA: Diagnosis present

## 2014-09-03 DIAGNOSIS — F1920 Other psychoactive substance dependence, uncomplicated: Secondary | ICD-10-CM

## 2014-09-03 DIAGNOSIS — B20 Human immunodeficiency virus [HIV] disease: Secondary | ICD-10-CM | POA: Diagnosis present

## 2014-09-03 DIAGNOSIS — F17210 Nicotine dependence, cigarettes, uncomplicated: Secondary | ICD-10-CM | POA: Diagnosis present

## 2014-09-03 DIAGNOSIS — Z79899 Other long term (current) drug therapy: Secondary | ICD-10-CM | POA: Insufficient documentation

## 2014-09-03 DIAGNOSIS — F339 Major depressive disorder, recurrent, unspecified: Secondary | ICD-10-CM | POA: Diagnosis present

## 2014-09-03 DIAGNOSIS — F1220 Cannabis dependence, uncomplicated: Secondary | ICD-10-CM | POA: Insufficient documentation

## 2014-09-03 DIAGNOSIS — F1420 Cocaine dependence, uncomplicated: Secondary | ICD-10-CM | POA: Insufficient documentation

## 2014-09-03 DIAGNOSIS — F1410 Cocaine abuse, uncomplicated: Secondary | ICD-10-CM | POA: Diagnosis present

## 2014-09-03 DIAGNOSIS — Z21 Asymptomatic human immunodeficiency virus [HIV] infection status: Secondary | ICD-10-CM | POA: Insufficient documentation

## 2014-09-03 LAB — COMPREHENSIVE METABOLIC PANEL
ALT: 19 U/L (ref 0–53)
AST: 25 U/L (ref 0–37)
Albumin: 4.5 g/dL (ref 3.5–5.2)
Alkaline Phosphatase: 60 U/L (ref 39–117)
Anion gap: 6 (ref 5–15)
BUN: 5 mg/dL — ABNORMAL LOW (ref 6–23)
CO2: 31 mmol/L (ref 19–32)
Calcium: 9.6 mg/dL (ref 8.4–10.5)
Chloride: 102 mEq/L (ref 96–112)
Creatinine, Ser: 1.17 mg/dL (ref 0.50–1.35)
GFR calc Af Amer: >90 mL/min (ref >90)
GFR calc non Af Amer: 80 mL/min — ABNORMAL LOW (ref >90)
Glucose, Bld: 105 mg/dL — ABNORMAL HIGH (ref 70–99)
Potassium: 4 mmol/L (ref 3.5–5.1)
Sodium: 139 mmol/L (ref 135–145)
Total Bilirubin: 1.7 mg/dL — ABNORMAL HIGH (ref 0.3–1.2)
Total Protein: 7.1 g/dL (ref 6.0–8.3)

## 2014-09-03 LAB — CBC WITH DIFFERENTIAL/PLATELET
Basophils Absolute: 0 10*3/uL (ref 0.0–0.1)
Basophils Relative: 1 % (ref 0–1)
Eosinophils Absolute: 0 10*3/uL (ref 0.0–0.7)
Eosinophils Relative: 1 % (ref 0–5)
HCT: 49.7 % (ref 39.0–52.0)
Hemoglobin: 17.6 g/dL — ABNORMAL HIGH (ref 13.0–17.0)
Lymphocytes Relative: 29 % (ref 12–46)
Lymphs Abs: 0.9 10*3/uL (ref 0.7–4.0)
MCH: 34.4 pg — ABNORMAL HIGH (ref 26.0–34.0)
MCHC: 35.4 g/dL (ref 30.0–36.0)
MCV: 97.3 fL (ref 78.0–100.0)
Monocytes Absolute: 0.1 10*3/uL (ref 0.1–1.0)
Monocytes Relative: 5 % (ref 3–12)
Neutro Abs: 2 10*3/uL (ref 1.7–7.7)
Neutrophils Relative %: 64 % (ref 43–77)
Platelets: 157 10*3/uL (ref 150–400)
RBC: 5.11 MIL/uL (ref 4.22–5.81)
RDW: 12.5 % (ref 11.5–15.5)
WBC: 3.1 10*3/uL — ABNORMAL LOW (ref 4.0–10.5)

## 2014-09-03 LAB — SALICYLATE LEVEL: Salicylate Lvl: <4 mg/dL (ref 2.8–20.0)

## 2014-09-03 LAB — ACETAMINOPHEN LEVEL: Acetaminophen (Tylenol), Serum: <10 ug/mL — ABNORMAL LOW (ref 10–30)

## 2014-09-03 LAB — ETHANOL: Alcohol, Ethyl (B): <5 mg/dL (ref 0–9)

## 2014-09-03 MED ORDER — VITAMIN B-1 100 MG PO TABS
100.0000 mg | ORAL_TABLET | Freq: Every day | ORAL | Status: DC
  Administered 2014-09-03: 100 mg via ORAL
  Filled 2014-09-03: qty 1

## 2014-09-03 MED ORDER — ARIPIPRAZOLE 10 MG PO TABS
10.0000 mg | ORAL_TABLET | Freq: Every day | ORAL | Status: DC
  Administered 2014-09-03: 10 mg via ORAL
  Filled 2014-09-03: qty 1

## 2014-09-03 MED ORDER — ARIPIPRAZOLE 10 MG PO TBDP: 10.0000 mg | ORAL_TABLET | Freq: Every day | ORAL | Status: DC

## 2014-09-03 MED ORDER — ZOLPIDEM TARTRATE 5 MG PO TABS: 5.0000 mg | ORAL_TABLET | Freq: Every evening | ORAL | Status: DC | PRN

## 2014-09-03 MED ORDER — ALUM & MAG HYDROXIDE-SIMETH 200-200-20 MG/5ML PO SUSP: 30.0000 mL | ORAL | Status: DC | PRN

## 2014-09-03 MED ORDER — ACETAMINOPHEN 325 MG PO TABS
650.0000 mg | ORAL_TABLET | ORAL | Status: DC | PRN
  Administered 2014-09-03: 650 mg via ORAL
  Filled 2014-09-03: qty 2

## 2014-09-03 MED ORDER — INFLUENZA VAC SPLIT QUAD 0.5 ML IM SUSY
0.5000 mL | PREFILLED_SYRINGE | INTRAMUSCULAR | Status: DC
  Filled 2014-09-03: qty 0.5

## 2014-09-03 MED ORDER — ELVITEG-COBIC-EMTRICIT-TENOFDF 150-150-200-300 MG PO TABS
1.0000 | ORAL_TABLET | Freq: Every day | ORAL | Status: DC
  Filled 2014-09-03: qty 1

## 2014-09-03 MED ORDER — PNEUMOCOCCAL VAC POLYVALENT 25 MCG/0.5ML IJ INJ: 0.5000 mL | INJECTION | INTRAMUSCULAR | Status: DC

## 2014-09-03 MED ORDER — THIAMINE HCL 100 MG/ML IJ SOLN: 100.0000 mg | Freq: Every day | INTRAMUSCULAR | Status: DC

## 2014-09-03 MED ORDER — LORAZEPAM 1 MG PO TABS: 0.0000 mg | ORAL_TABLET | Freq: Two times a day (BID) | ORAL | Status: DC

## 2014-09-03 MED ORDER — BUSPIRONE HCL 10 MG PO TABS
10.0000 mg | ORAL_TABLET | Freq: Two times a day (BID) | ORAL | Status: DC
  Administered 2014-09-03: 10 mg via ORAL
  Filled 2014-09-03: qty 1

## 2014-09-03 MED ORDER — ELVITEG-COBIC-EMTRICIT-TENOFAF 150-150-200-10 MG PO TABS
1.0000 | ORAL_TABLET | Freq: Every day | ORAL | Status: DC
  Filled 2014-09-03: qty 1

## 2014-09-03 MED ORDER — TRAZODONE HCL 50 MG PO TABS: 50.0000 mg | ORAL_TABLET | Freq: Every evening | ORAL | Status: DC | PRN

## 2014-09-03 MED ORDER — ONDANSETRON HCL 4 MG PO TABS
4.0000 mg | ORAL_TABLET | Freq: Three times a day (TID) | ORAL | Status: DC | PRN
  Administered 2014-09-03: 4 mg via ORAL
  Filled 2014-09-03: qty 1

## 2014-09-03 MED ORDER — HYDROXYZINE HCL 25 MG PO TABS: 25.0000 mg | ORAL_TABLET | Freq: Four times a day (QID) | ORAL | Status: DC | PRN

## 2014-09-03 MED ORDER — NICOTINE 21 MG/24HR TD PT24
21.0000 mg | MEDICATED_PATCH | Freq: Every day | TRANSDERMAL | Status: DC
  Administered 2014-09-03: 21 mg via TRANSDERMAL
  Filled 2014-09-03: qty 1

## 2014-09-03 MED ORDER — LORAZEPAM 1 MG PO TABS
0.0000 mg | ORAL_TABLET | Freq: Four times a day (QID) | ORAL | Status: DC
  Administered 2014-09-03: 1 mg via ORAL
  Filled 2014-09-03: qty 1
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
## 2014-09-03 NOTE — BH Assessment (Signed)
Assessment complete. Binnie Rail, [facility], confirmed bed availability. Gave clinical report to Maryjean Morn, PA-C who accepted Pt to the service of Dr. Geoffery Lyons, room 401-2. Notified Dr. Juleen China and Roseanne Reno, RN of acceptance.  Harlin Rain Ria Comment, [facility] Triage Specialist [phone]

## 2014-09-03 NOTE — BH Assessment (Signed)
Received call for assessment. Spoke to Dr. Raeford Razor who said Pt is abusing crack, alcohol and marijuana and requesting substance abuse treatment. He reports passive suicidal ideation. Tele-assessment will be initiated.  Harlin Rain Ria Comment, [facility] Triage Specialist [phone]

## 2014-09-03 NOTE — ED Notes (Signed)
Pt was accepted at [facility] Room 402 bed 2 accepted by MD Dub Mikes. [facility] called.

## 2014-09-03 NOTE — ED Notes (Signed)
Telepsych in progress. Pt placed in paper scrubs.

## 2014-09-03 NOTE — ED Notes (Signed)
Attempted report 

## 2014-09-03 NOTE — ED Notes (Signed)
[facility] to [facility]ic at [facility]. He advises they do not have a male detox bed available tonight.
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
## 2014-09-03 NOTE — ED Notes (Signed)
Pt presents to department for evaluation of detox from ETOH, crack cocaine and marijuana. Last use today. Pt states he would like to get help today. Denies SI/HI. Pt is alert and oriented x4.

## 2014-09-04 DIAGNOSIS — F331 Major depressive disorder, recurrent, moderate: Secondary | ICD-10-CM | POA: Diagnosis present

## 2014-09-04 DIAGNOSIS — F411 Generalized anxiety disorder: Secondary | ICD-10-CM | POA: Diagnosis present

## 2014-09-04 DIAGNOSIS — F1490 Cocaine use, unspecified, uncomplicated: Secondary | ICD-10-CM

## 2014-09-04 DIAGNOSIS — F1010 Alcohol abuse, uncomplicated: Secondary | ICD-10-CM | POA: Diagnosis present

## 2014-09-04 DIAGNOSIS — F1099 Alcohol use, unspecified with unspecified alcohol-induced disorder: Secondary | ICD-10-CM

## 2014-09-04 LAB — RAPID URINE DRUG SCREEN, HOSP PERFORMED
AMPHETAMINES: NOT DETECTED
Barbiturates: NOT DETECTED
Benzodiazepines: POSITIVE — AB
Cocaine: POSITIVE — AB
Opiates: NOT DETECTED
TETRAHYDROCANNABINOL: NOT DETECTED

## 2014-09-04 MED ORDER — VITAMIN B-1 100 MG PO TABS
100.0000 mg | ORAL_TABLET | Freq: Every day | ORAL | Status: DC
  Administered 2014-09-05 – 2014-09-09 (×5): 100 mg via ORAL
  Filled 2014-09-04 (×7): qty 1

## 2014-09-04 MED ORDER — CHLORDIAZEPOXIDE HCL 25 MG PO CAPS: 25.0000 mg | ORAL_CAPSULE | Freq: Once | ORAL | Status: DC

## 2014-09-04 MED ORDER — ARIPIPRAZOLE 5 MG PO TABS
5.0000 mg | ORAL_TABLET | Freq: Every day | ORAL | Status: DC
  Administered 2014-09-04 – 2014-09-09 (×6): 5 mg via ORAL
  Filled 2014-09-04 (×7): qty 1

## 2014-09-04 MED ORDER — ONDANSETRON 4 MG PO TBDP: 4.0000 mg | ORAL_TABLET | Freq: Four times a day (QID) | ORAL | Status: AC | PRN

## 2014-09-04 MED ORDER — ACETAMINOPHEN 325 MG PO TABS: 650.0000 mg | ORAL_TABLET | Freq: Four times a day (QID) | ORAL | Status: DC | PRN

## 2014-09-04 MED ORDER — CHLORDIAZEPOXIDE HCL 25 MG PO CAPS
25.0000 mg | ORAL_CAPSULE | Freq: Four times a day (QID) | ORAL | Status: DC
  Administered 2014-09-04 (×2): 25 mg via ORAL
  Filled 2014-09-04 (×2): qty 1

## 2014-09-04 MED ORDER — CHLORDIAZEPOXIDE HCL 25 MG PO CAPS: 25.0000 mg | ORAL_CAPSULE | Freq: Four times a day (QID) | ORAL | Status: DC | PRN

## 2014-09-04 MED ORDER — HYDROXYZINE HCL 25 MG PO TABS
25.0000 mg | ORAL_TABLET | Freq: Four times a day (QID) | ORAL | Status: AC | PRN
  Administered 2014-09-05 (×2): 25 mg via ORAL
  Filled 2014-09-04 (×2): qty 1

## 2014-09-04 MED ORDER — ELVITEG-COBIC-EMTRICIT-TENOFAF 150-150-200-10 MG PO TABS
1.0000 | ORAL_TABLET | Freq: Every day | ORAL | Status: DC
  Administered 2014-09-05 – 2014-09-09 (×5): 1 via ORAL
  Filled 2014-09-04 (×7): qty 1

## 2014-09-04 MED ORDER — TRAZODONE HCL 100 MG PO TABS
100.0000 mg | ORAL_TABLET | Freq: Every evening | ORAL | Status: DC | PRN
  Administered 2014-09-04 – 2014-09-08 (×4): 100 mg via ORAL
  Filled 2014-09-04 (×4): qty 1

## 2014-09-04 MED ORDER — ARIPIPRAZOLE 5 MG PO TABS
ORAL_TABLET | ORAL | Status: AC
  Administered 2014-09-04: 12:00:00
  Filled 2014-09-04: qty 1

## 2014-09-04 MED ORDER — MAGNESIUM HYDROXIDE 400 MG/5ML PO SUSP: 30.0000 mL | Freq: Every day | ORAL | Status: DC | PRN

## 2014-09-04 MED ORDER — BUSPIRONE HCL 10 MG PO TABS
10.0000 mg | ORAL_TABLET | Freq: Two times a day (BID) | ORAL | Status: DC
  Administered 2014-09-04 – 2014-09-09 (×10): 10 mg via ORAL
  Filled 2014-09-04 (×3): qty 1
  Filled 2014-09-04: qty 2
  Filled 2014-09-04 (×10): qty 1
  Filled 2014-09-04: qty 2
  Filled 2014-09-04: qty 1

## 2014-09-04 MED ORDER — CHLORDIAZEPOXIDE HCL 25 MG PO CAPS
25.0000 mg | ORAL_CAPSULE | Freq: Three times a day (TID) | ORAL | Status: DC | PRN
  Administered 2014-09-04 – 2014-09-08 (×6): 25 mg via ORAL
  Filled 2014-09-04 (×6): qty 1

## 2014-09-04 MED ORDER — CHLORDIAZEPOXIDE HCL 25 MG PO CAPS: 25.0000 mg | ORAL_CAPSULE | Freq: Every day | ORAL | Status: DC

## 2014-09-04 MED ORDER — CHLORDIAZEPOXIDE HCL 25 MG PO CAPS: 25.0000 mg | ORAL_CAPSULE | ORAL | Status: DC

## 2014-09-04 MED ORDER — NICOTINE 21 MG/24HR TD PT24
21.0000 mg | MEDICATED_PATCH | Freq: Every day | TRANSDERMAL | Status: DC
  Administered 2014-09-04: 21 mg via TRANSDERMAL
  Filled 2014-09-04 (×4): qty 1

## 2014-09-04 MED ORDER — ALUM & MAG HYDROXIDE-SIMETH 200-200-20 MG/5ML PO SUSP: 30.0000 mL | ORAL | Status: DC | PRN

## 2014-09-04 MED ORDER — CHLORDIAZEPOXIDE HCL 25 MG PO CAPS: 25.0000 mg | ORAL_CAPSULE | Freq: Three times a day (TID) | ORAL | Status: DC

## 2014-09-04 MED ORDER — LOPERAMIDE HCL 2 MG PO CAPS: 2.0000 mg | ORAL_CAPSULE | ORAL | Status: AC | PRN

## 2014-09-04 MED ORDER — ADULT MULTIVITAMIN W/MINERALS CH
1.0000 | ORAL_TABLET | Freq: Every day | ORAL | Status: DC
  Administered 2014-09-04 – 2014-09-09 (×6): 1 via ORAL
  Filled 2014-09-04 (×8): qty 1

## 2014-09-04 NOTE — [facility] Suicide Risk Assessment (Signed)
Suicide Risk Assessment  Admission Assessment     Nursing information obtained from:    Demographic factors:    Current Mental Status:    Loss Factors:    Historical Factors:    Risk Reduction Factors:    Total Time spent with patient: 30 minutes  CLINICAL FACTORS:   Severe Anxiety and/or Agitation Depression:   Anhedonia Comorbid alcohol abuse/dependence Hopelessness Impulsivity Insomnia Severe Alcohol/Substance Abuse/Dependencies  Psychiatric Specialty Exam:     Blood pressure 131/88, pulse 97, temperature 98.5 F (36.9 C), temperature source Oral, resp. rate 20, height 6' (1.829 m), weight 87.998 kg (194 lb).Body mass index is 26.31 kg/(m^2).  General Appearance: Casual  Eye Contact::  Good  Speech:  Clear and Coherent  Volume:  Decreased  Mood:  Depressed and Hopeless  Affect:  Depressed  Thought Process:  Goal Directed and Logical  Orientation:  Full (Time, Place, and Person)  Thought Content:  Negative  Suicidal Thoughts:  No  Homicidal Thoughts:  No  Memory:  Immediate;   Good Recent;   Good Remote;   Good  Judgement:  Impaired  Insight:  Lacking  Psychomotor Activity:  Decreased  Concentration:  Fair  Recall:  Fair  Fund of Knowledge:Good  Language: Good  Akathisia:  No  Handed:  Right  AIMS (if indicated):     Assets:  Communication Skills Desire for Improvement Physical Health  Sleep:  Number of Hours: 5.25   Musculoskeletal: Strength & Muscle Tone: within normal limits Gait & Station: normal Patient leans: N/A  COGNITIVE FEATURES THAT CONTRIBUTE TO RISK:  Closed-mindedness    SUICIDE RISK:   Minimal: No identifiable suicidal ideation.  Patients presenting with no risk factors but with morbid ruminations; may be classified as minimal risk based on the severity of the depressive symptoms  PLAN OF CARE:1. Admit for crisis management and stabilization. 2. Medication management to reduce current symptoms to base line and improve the      patient's overall level of functioning 3. Treat health problems as indicated. 4. Develop treatment plan to decrease risk of relapse upon discharge and the need for     readmission. 5. Psycho-social education regarding relapse prevention and self care. 6. Health care follow up as needed for medical problems. 7. Restart home medications where appropriate.   I certify that inpatient services furnished can reasonably be expected to improve the patient's condition.  Thedore Mins, MD 09/04/2014, 12:11 PM
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
## 2014-09-04 NOTE — Tx Team (Addendum)
Initial Interdisciplinary Treatment Plan   PATIENT STRESSORS: Marital or family conflict Substance abuse   PATIENT STRENGTHS: Ability for insight Average or above average intelligence General fund of knowledge Motivation for treatment/growth Supportive family/friends Work skills   PROBLEM LIST: Problem List/Patient Goals Date to be addressed Date deferred Reason deferred Estimated date of resolution  ETOH detox      "I want to be not so CO-dependant"      "I want to be more independent"      "I need to change a lot of things in my life, now before it's too late"      "I would like to go to at Tx facility"                               DISCHARGE CRITERIA:  Ability to meet basic life and health needs Improved stabilization in mood, thinking, and/or behavior Safe-care adequate arrangements made Verbal commitment to aftercare and medication compliance  PRELIMINARY DISCHARGE PLAN: Attend aftercare/continuing care group Attend PHP/IOP Outpatient therapy Placement in alternative living arrangements  PATIENT/FAMIILY INVOLVEMENT: This treatment plan has been presented to and reviewed with the patient, Henry [name].  The patient and family have been given the opportunity to ask questions and make suggestions.  Jacques Navy A 09/04/2014, 12:04 AM

## 2014-09-04 NOTE — Progress Notes (Signed)
Adult Psychoeducational Group Note  Date:  09/04/2014 Time:  3:30PM  Group Topic/Focus:  Making Healthy Choices:   The focus of this group is to help patients identify negative/unhealthy choices they were using prior to admission and identify positive/healthier coping strategies to replace them upon discharge.  Participation Level:  Did Not Attend  Additional Comments:  Pt did not attend group. Pt was in his room, in the bed asleep during group time  Cambryn Charters K 09/04/2014, 4:08 PM

## 2014-09-04 NOTE — [facility] Group Notes (Signed)
[facility] LCSW Group Therapy  09/04/2014 1:15 PM  Type of Therapy:  Group Therapy  Participation Level:  Did not attend; pt replied positively when invited to group yet was a now show,   Clide Dales 09/04/2014

## 2014-09-04 NOTE — Progress Notes (Signed)
Psychoeducational Group Note  Date:  04/12/2012 Time: 1015  Group Topic/Focus:  Identifying Needs:   The focus of this group is to help patients identify their personal needs that have been historically problematic and identify healthy behaviors to address their needs.  Participation Level:  attentive  Participation Quality: good  Affect: flat  Cognitive:  intact  Insight:  good  Engagement in Group:  Additional Comments:

## 2014-09-04 NOTE — Progress Notes (Signed)
The patient did not attend group this evening.  

## 2014-09-04 NOTE — Progress Notes (Signed)
D.  Pt pleasant on approach, complaint of anxiety.  Pt did not attend evening AA group.  Pt did get up for evening snacks and was interacting appropriately with peers on the unit.  Denies SI/HI/hallucinations at this time.  A.  Support and encouragement offered   R.  PT remains safe on unit, will continue to monitor.

## 2014-09-04 NOTE — Progress Notes (Signed)
Patient ID: Henry [name], male   DOB: April 12, 1981, 34 y.o.   MRN: [id number] D. Patient has been flat and depressed today. Denies HI, SI, AVH. States he feels useless, hopeless but denies he has suicidal ideation.Denies withdrawal symptoms.Minimal time spent in milieu  A. Meds given as ordered. Patient encouraged to attend groups. Staff offered emotional support to patient.  R. Continue to monitor for withdrawal symptoms and SI.
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
## 2014-09-05 ENCOUNTER — Encounter (HOSPITAL_COMMUNITY): Payer: Self-pay | Admitting: Registered Nurse

## 2014-09-05 DIAGNOSIS — F1010 Alcohol abuse, uncomplicated: Principal | ICD-10-CM

## 2014-09-05 DIAGNOSIS — F332 Major depressive disorder, recurrent severe without psychotic features: Secondary | ICD-10-CM

## 2014-09-05 DIAGNOSIS — F1910 Other psychoactive substance abuse, uncomplicated: Secondary | ICD-10-CM

## 2014-09-05 DIAGNOSIS — R45851 Suicidal ideations: Secondary | ICD-10-CM
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
## 2014-09-05 NOTE — Progress Notes (Signed)
Patient did attend the evening speaker AA meeting.  

## 2014-09-05 NOTE — Progress Notes (Signed)
D.  Pt pleasant on approach, positive for evening AA group.  Interacting appropriately with peers on the unit.  Denies SI/HI/hallucinations at this time.  A.  Support and encouragement offered  R.  Pt remains safe on unit, will continue to monitor.

## 2014-09-05 NOTE — [facility] Group Notes (Signed)
[facility] LCSW Group Therapy  09/05/2014 1:15 PM  Type of Therapy:  Group Therapy  Participation Level:  Did Not Attend. Patient seen lying in bed before group; invited to attend but was a no show.    Carney Bern, LCSW

## 2014-09-05 NOTE — Progress Notes (Signed)
D) Pt has been attending the groups, participating and interacting appropriate.y with his peers. Affect is sad at times, and mood is depressed. States overall "I am feeling a bit better". Is spontaneous in affect with interaction. Pt rates his depression at an 8 and his hopelessness at a 6. Denies SI and HI. A) given support and provided with a 1:1. Encouragement and praise given along with respect. R) Pt denies SI and HI.

## 2014-09-05 NOTE — Progress Notes (Signed)
Psychoeducational Group Note  Date:  09/05/2014 Time:  1015  Group Topic/Focus:  Making Healthy Choices:   The focus of this group is to help patients identify negative/unhealthy choices they were using prior to admission and identify positive/healthier coping strategies to replace them upon discharge.  Participation Level:  Active  Participation Quality:  Appropriate  Affect:  Appropriate  Cognitive:  Oriented  Insight:  Improving  Engagement in Group:  Engaged  Additional Comments:  Pt participated and added much to the group.  Danyella Mcginty A 09/05/2014 
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
## 2014-09-06 DIAGNOSIS — F1920 Other psychoactive substance dependence, uncomplicated: Secondary | ICD-10-CM

## 2014-09-06 DIAGNOSIS — F411 Generalized anxiety disorder: Secondary | ICD-10-CM

## 2014-09-06 DIAGNOSIS — F1120 Opioid dependence, uncomplicated: Secondary | ICD-10-CM | POA: Diagnosis present

## 2014-09-06 DIAGNOSIS — F1994 Other psychoactive substance use, unspecified with psychoactive substance-induced mood disorder: Secondary | ICD-10-CM

## 2014-09-06 DIAGNOSIS — F339 Major depressive disorder, recurrent, unspecified: Secondary | ICD-10-CM | POA: Diagnosis present

## 2014-09-06 MED ORDER — NICOTINE POLACRILEX 2 MG MT GUM
2.0000 mg | CHEWING_GUM | OROMUCOSAL | Status: DC | PRN
  Administered 2014-09-06: 2 mg via ORAL
  Filled 2014-09-06: qty 1

## 2014-09-06 NOTE — Plan of Care (Signed)
Problem: Ineffective individual coping Goal: STG: Patient will remain free from self harm Outcome: Progressing Patient remains free from self harm. 15 minute checks continued per protocol for patient safety.   Problem: Alteration in mood & ability to function due to Goal: LTG-Pt reports reduction in suicidal thoughts (Patient reports reduction in suicidal thoughts and is able to verbalize a safety plan for whenever patient is feeling suicidal)  Outcome: Progressing Patient denies having suicidal thoughts today. Goal: STG-Patient will comply with prescribed medication regimen (Patient will comply with prescribed medication regimen)  Outcome: Progressing Patient has complied with medication regimen today.  Comments:  Alena Bills, RN

## 2014-09-06 NOTE — Progress Notes (Signed)
Adult Psychoeducational Group Note  Date:  09/06/2014 Time:  1015  Group Topic/Focus:  Self Care:   The focus of this group is to help patients understand the importance of self-care in order to improve or restore emotional, physical, spiritual, interpersonal, and financial health.  Participation Level:  Active  Participation Quality:  Appropriate  Affect:  Appropriate  Cognitive:  Appropriate  Insight: Appropriate and Good  Engagement in Group:  Developing/Improving and Engaged  Modes of Intervention:  Discussion and Education  Additional Comments:  Pt. attended and participated in group discussion focused on self-care geared towards strength and weakness and barriers  Gwenevere Ghazi Patience 09/06/2014, 1:33 PM

## 2014-09-06 NOTE — Clinical Social Work Note (Signed)
Referral faxed to [facility] and initiated referral to [facility] per patient request.  Samuella Bruin, MSW, [facility] Clinical Social Worker [facility] [phone]

## 2014-09-06 NOTE — Clinical Social Work Note (Signed)
[facility] LCSW Aftercare Discharge Planning Group Note  09/06/2014  8:45 AM  Participation Quality: Did Not Attend. Patient invited to attend but declined.   Samuella Bruin, MSW, [employer] Clinical Social Worker [facility] [phone]
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
## 2014-09-06 NOTE — Progress Notes (Signed)
D: Patient is alert and oriented. Pt's mood and affect is depressed and blunted but pleasant upon interaction. Pt's eye contact is fair. Pt rates his depression and hopelessness 6/10 and anxiety 10/10. Pt reports his mood for the day is "to stay in a good mood and be around good people." Pt denies SI/HI and AVH. Pt requests nicotine gum, rather than patch. A: New orders placed, and acknowledged. Pt encouraged to try deep breathing exercises, demonstration provided. Active listening by RN. Encouragement/Support provided to pt. PRN medication administered for nicotine craving and anxiety per providers orders (See MAR). Scheduled medications administered per providers orders (See MAR). 15 minute checks continued per protocol for patient safety.  R: Patient cooperative and receptive to nursing interventions.

## 2014-09-06 NOTE — [facility] Suicide Risk Assessment (Signed)
[facility] INPATIENT:  Family/Significant Other Suicide Prevention Education  Suicide Prevention Education:  Education Completed; [place] [phone],  (name of family member/significant other) has been identified by the patient as the family member/significant other with whom the patient will be residing, and identified as the person(s) who will aid the patient in the event of a mental health crisis (suicidal ideations/suicide attempt).  With written consent from the patient, the family member/significant other has been provided the following suicide prevention education, prior to the and/or following the discharge of the patient.  The suicide prevention education provided includes the following:  Suicide risk factors  Suicide prevention and interventions  National Suicide Hotline telephone number  [facility] assessment telephone number  [facility] Emergency Assistance 911  [facility] and/or Residential Mobile Crisis Unit telephone number  Request made of family/significant other to:  Remove weapons (e.g., guns, rifles, knives), all items previously/currently identified as safety concern.    Remove drugs/medications (over-the-counter, prescriptions, illicit drugs), all items previously/currently identified as a safety concern.  The family member/significant other verbalizes understanding of the suicide prevention education information provided.  The family member/significant other agrees to remove the items of safety concern listed above.  Henry [name], [place] 09/06/2014, 4:28 PM
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
## 2014-09-07 MED ORDER — NICOTINE 21 MG/24HR TD PT24
21.0000 mg | MEDICATED_PATCH | Freq: Every day | TRANSDERMAL | Status: DC
  Administered 2014-09-07 – 2014-09-09 (×3): 21 mg via TRANSDERMAL
  Filled 2014-09-07 (×5): qty 1

## 2014-09-07 NOTE — Progress Notes (Signed)
Pt observed in his room in bed.  He has been in his room all evening.  He reports mild withdrawal symptoms tonight.  He denies SI/HI/AV at this time.  Pt plans to go to long term treatment after detox.  Pt voiced no needs or concerns during the assessment.  Support and encouragement offered.  Safety maintained with q15 minute checks.

## 2014-09-07 NOTE — Plan of Care (Signed)
Problem: Alteration in mood; excessive anxiety as evidenced by: Goal: STG-Patient can identify triggers for anxiety Outcome: Progressing Patient is able to identify triggers for anxiety including discharge plans and challenging decision making.  Problem: Ineffective individual coping Goal: STG: Patient will remain free from self harm Outcome: Progressing Patient remains free from self harm. 15 minute checks continued per protocol for patient safety.   Problem: Alteration in mood Goal: LTG-Patient reports reduction in suicidal thoughts (Patient reports reduction in suicidal thoughts and is able to verbalize a safety plan for whenever patient is feeling suicidal)  Outcome: Progressing Patient denies having any suicidal thoughts today.   Comments:  Alena BillsGuthrie, Kenzlei Runions A, RN
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
## 2014-09-07 NOTE — Clinical Social Work Note (Signed)
[name] due [facility].   [facility], no male bed availability until February 2016.   [name], MSW, [employer]CSWA Clinical Social Worker [facility] [phone]

## 2014-09-07 NOTE — Progress Notes (Signed)
Pt attended spiritual care group on grief and loss facilitated by chaplain Burnis KingfisherMatthew Stalnaker and counseling intern [place] Breana Litts. Group opened with brief discussion and psycho-social ed around grief and loss in relationships and in relation to self - identifying life patterns, circumstances, changes that cause losses. Established group norm of speaking from own life experience. Group goal of establishing open and affirming space for members to share loss and experience with grief, normalize grief experience and provide psycho social education and grief support.  Group drew on narrative and Alderian therapeutic modalities.   Henry [name] was present throughout group but did not contribute to group discussion.   [place] Towanna Avery Counseling Intern

## 2014-09-07 NOTE — Tx Team (Signed)
Interdisciplinary Treatment Plan Update (Adult) Date: 09/07/2014   Time Reviewed: 9:30 AM  Progress in Treatment: Attending groups: Yes Participating in groups: Yes Taking medication as prescribed: Yes Tolerating medication: Yes Family/Significant other contact made: Yes, CSW has spoken with patient's mother  Patient understands diagnosis: Yes Discussing patient identified problems/goals with staff: Yes Medical problems stabilized or resolved: Yes Denies suicidal/homicidal ideation: Yes Issues/concerns per patient self-inventory: Yes Other:  New problem(s) identified: N/A  Discharge Plan or Barriers: CSW continuing to assess. Patient deciding between residential vs. Outpatient treatment.   Reason for Continuation of Hospitalization:  Depression Anxiety Medication Stabilization   Comments: N/A  Estimated length of stay: 1-2 days  For review of initial/current patient goals, please see plan of care. 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 [place]. Patient will benefit from crisis stabilization, medication evaluation, group therapy, and psycho education in addition to case management for discharge planning. Patient and CSW reviewed pt's identified goals and treatment plan. Pt verbalized understanding and agreed to treatment plan.   Update:  09/07/14: [facility] well as [facility] for patient. Patient is deciding between residential vs. Outpatient services at this time.    Attendees: Patient:     Family:     Physician:  Dr. Dub MikesLugo; Dr. Jama Flavorsobos 09/07/2014 9:30 AM   Nursing:  Quintella ReichertBeverly Knight; Michaelle BirksBritney Guthrie, RN 09/07/2014 9:30 AM   Clinical Social Worker:  Samuella BruinKristin Keziyah Kneale, LCSWA  09/07/2014 9:30 AM    Other: Juline PatchQuylle Hodnett, LCSW 09/07/2014 9:30 AM    Other:  Leisa LenzValerie Enoch, [facility] 09/07/2014 9:30 AM    Other:  Tomasita Morrowelora  Sutton, P4[facility] 09/07/2014 9:30 AM   Other:  Onnie BoerJennifer Clark, Case Manager 09/07/2014 9:30 AM   Other:    Other:        Scribe for Treatment Team:  Samuella BruinKristin Lavera Vandermeer, MSW, LCSWA [phone]

## 2014-09-07 NOTE — Progress Notes (Signed)
The focus of this group is to help patients review their daily goal of treatment and discuss progress on daily workbooks. Pt attended the evening group session and responded to all discussion prompts from the Writer. Pt shared that today was a good day, the highlight of which was a positive telephone conversation with his mother. "I can feel that she is supportive of me." Pt mentioned that he was glad he attended several of the groups today and that they were beneficial to him. Henry [name] reported having no additional needs from Nursing Staff this evening. Pt's affect was appropriate.

## 2014-09-07 NOTE — Progress Notes (Signed)
D: Patient is alert and oriented. Pt's mood and affect is "alright" and blunted. Pt reports he slept well. Pt denies SI/HI and AVH at this time. Pt did not attend morning RN group. Pt reports his depression 7/10 and hopelessness and anxiety 8/10. Pt reports his goal for the day is to "stay happy" and "stay on focus." Pt requests to go back to using the nicotine patch rather than the gum; pt reports the patch helped relieve cravings better. Pt reports increased anxiety this afternoon with only a little relief from PRN medication; pt expresses concerns about wanting additional medication orders.  A: New orders placed and acknowledged. Encouragement/Support provided to pt. Active listening by RN. Pt encouraged to speak with Provider about anxiety medication concerns. PRN medications administered for anxiety per providers orders. Scheduled medications administered per providers orders (See MAR). 15 minute checks continued per protocol for patient safety.  R: Patient cooperative and receptive to nursing interventions.

## 2014-09-07 NOTE — [facility] Group Notes (Signed)
[facility] LCSW Group Therapy  09/07/2014   1:15 PM   Type of Therapy:  Group Therapy  Participation Level:  Active  Participation Quality:  Attentive, Sharing and Supportive  Affect:  Depressed and Flat  Cognitive:  Alert and Oriented  Insight:  Developing/Improving and Engaged  Engagement in Therapy:  Developing/Improving and Engaged  Modes of Intervention:  Clarification, Confrontation, Discussion, Education, Exploration, Limit-setting, Orientation, Problem-solving, Rapport Building, Dance movement psychotherapisteality Testing, Socialization and Support  Summary of Progress/Problems: The topic for group therapy was feelings about diagnosis.  Pt actively participated in group discussion on their past and current diagnosis and how they feel towards this.  Pt also identified how society and family members judge them, based on their diagnosis as well as stereotypes and stigmas.  Patient discussed his tendency to isolate himself and stay away from people. He reports trying not to let his diagnosis determine how he acts and treats others. Patient admitted that he is sometimes embarrassed about his diagnosis. CSW's and other group members provide emotional support and encouragement.  Samuella BruinKristin Bernetha Anschutz, MSW, [employer]CSWA Clinical Social Worker [facility] [phone]

## 2014-09-07 NOTE — Clinical Social Work Note (Signed)
CSW left voicemail for public defender D[place] Aronya Cutchin [phone] regarding continuing court date scheduled for January 15th to allow patient to go into residential treatment. Awaiting return call.  Samuella BruinKristin Jullianna Gabor, MSW, [employer]CSWA Clinical Social Worker [facility] [phone]

## 2014-09-07 NOTE — Progress Notes (Signed)
Recreation Therapy Notes  Animal-Assisted Activity/Therapy (AAA/T) Program Checklist/Progress Notes Patient Eligibility Criteria Checklist & Daily Group note for Rec Tx Intervention  Date: 01.05.2015 Time: 2:45pm [address]    AAA/T Program Assumption of Risk Form signed by Patient/ or Parent Legal Guardian yes  Patient is free of allergies or sever asthma yes  Patient reports no fear of animals yes  Patient reports no history of cruelty to animals yes  Patient understands his/her participation is voluntary yes  Patient washes hands before animal contact yes  Patient washes hands after animal contact yes  Behavioral Response: Engaged, Appropriate   Education: Charity fundraiserHand Washing, Appropriate Animal Interaction   Education Outcome: Acknowledges education.   Clinical Observations/Feedback: Patient interacted appropriately with therapy dog and peers.   Marykay Lexenise L Bradely Rudin, LRT/CTRS  Jearl KlinefelterBlanchfield, Maryclare Nydam L 09/07/2014 4:54 PM

## 2014-09-07 NOTE — [facility] Group Notes (Signed)
[facility] Group Notes:  (Nursing/MHT/Case Management/Adjunct)  Date:  09/07/2014  Time:  0900  Type of Therapy:  Nurse Education  Participation Level:  Did Not Attend  Participation Quality:  Did not attend  Affect:  Did not attend  Cognitive:  Did not attend  Insight:  None  Engagement in Group:  Did not attend  Modes of Intervention:  Discussion, Education and Support  Summary of Progress/Problems: Pt did not attend group and remained resting in bed this morning.  Lendell CapriceGuthrie, Evva Din A 09/07/2014, 9:50 AM

## 2014-09-08 NOTE — [facility] Group Notes (Signed)
[facility] LCSW Group Therapy  Emotional Regulation 1:15 - 2: 30 PM        09/08/2014  2:38 PM    Type of Therapy:  Group Therapy  Participation Level:  Appropriate  Participation Quality:  Appropriate  Affect:  Appropriate  Cognitive:  Attentive Appropriate  Insight:  Developing/Improving Engaged  Engagement in Therapy:  Developing/Improving Engaged  Modes of Intervention:  Discussion Exploration Problem-Solving Supportive  Summary of Progress/Problems:  Group topic was emotional regulations.  Patient participated in the discussion and was able to identify an emotion that needed to regulated.  He advised the emotion he needs to better control is frustration which leads to him becoming angry.  Patient shared he has to learn to speak up for himself and stop trying to please everyone.    Wynn BankerHodnett, Kari Montero Hairston 09/08/2014   2:38 PM

## 2014-09-08 NOTE — Progress Notes (Signed)
D:Patient in his room in bed on approach.  Patient states he has been visible on the unit but states he took a nap after dinner.  Patient appears flat and depressed.  Patient also states he is anxious. Patient states he cannot remember his goal for today but states he had a good day.     Patient denies SI/HI and denies AVH.   A: Staff to monitor Q 15 mins for safety.  Encouragement and support offered.  Scheduled medications administered per orders.  Librium administered prn. R: Patient remains safe on the unit.  Patient did attended group tonight.  Patient visible on the unit for medications and snack tonight.  Patient taking administered medications.

## 2014-09-08 NOTE — [facility] Group Notes (Signed)
[facility]   09/08/2014 10:57 AM    Participation Quality:  Appropraite  Mood/Affect:  Appropriate  Depression Rating:  5  Anxiety Rating:  5  Thoughts of Suicide:  No  Will you contract for safety?   NA  Current AVH:  No  Plan for Discharge/Comments:  Patient attended discharge planning group and actively participated in group. He reports feeling okay.  [facility]  Suicide prevention education reviewed and SPE document provided.   Transportation Means: Patient has transportation.   Supports:  Patient has a support system.   Henry [name], Henry [name]

## 2014-09-08 NOTE — Progress Notes (Signed)
[facility] MD Progress Note  09/08/2014 12:45 PM Henry [name]  MRN:  [id number] Subjective:  Henry [name] states that he has done a lot of thinking and has decided he needs to go to a residential treatment program. He states he wanted to get out , get back to his routine knowing that he was probably going back to the same environment to repeat the same behaviors that would keep his addiction going. States he is afraid of relapsing, more so because given his medical condition (HIV) he is afraid to do anything that would affect his immunity and he knows drinking and doing drugs will affect it. States he thinks the medications do work to help his anxiety and depression when he takes them regularly and does not use. When he relapses he quits his psychotropics as well as his HIV medications increasing the chance of developing tolerance to the antivirals. 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   HENT: Negative.   Eyes: Negative.   Respiratory: Negative.   Cardiovascular: Negative.   Gastrointestinal: Negative.   Genitourinary: Negative.   Musculoskeletal: Negative.   Skin: Negative.   Neurological: Negative.   Endo/Heme/Allergies: Negative.   Psychiatric/Behavioral: Positive for depression and substance abuse. The patient is nervous/anxious.     Blood pressure 116/90, pulse 91, temperature 97.4 F (36.3 C), temperature source Oral, resp. rate 18, height 6' (1.829 m), weight 87.998 kg (194 lb).Body mass index is 26.31 kg/(m^2).  General Appearance: Fairly Groomed  Patent attorney::  Fair  Speech:  Clear and Coherent  Volume:   Decreased  Mood:  Anxious, Depressed and worried  Affect:  sad, anxious, worried, teary eyed  Thought Process:  Coherent and Goal Directed  Orientation:  Full (Time, Place, and Person)  Thought Content:  symptoms events worries concerns fear of relapsing  Suicidal Thoughts:  No  Homicidal Thoughts:  No  Memory:  Immediate;   Fair Recent;   Fair Remote;   Fair  Judgement:  Fair  Insight:  Present  Psychomotor Activity:  Restlessness  Concentration:  Fair  Recall:  [employer] of Knowledge:Fair  Language: Fair  Akathisia:  No  Handed:    AIMS (if indicated):     Assets:  Desire for Improvement Social Support Vocational/Educational  Sleep:  Number of Hours: 6.5   Musculoskeletal: Strength & Muscle Tone: within normal limits Gait & Station: normal Patient leans: N/A  Current Medications: Current Facility-Administered Medications  Medication Dose Route Frequency Provider Last Rate Last Dose  . acetaminophen (TYLENOL) tablet 650 mg  650 mg Oral Q6H PRN Rachael Fee, MD      . alum & mag hydroxide-simeth (MAALOX/MYLANTA) 200-200-20 MG/5ML suspension 30 mL  30 mL Oral Q4H PRN Rachael Fee, MD      . ARIPiprazole (ABILIFY) tablet 5 mg  5 mg Oral Daily Mojeed Akintayo   5 mg at 09/08/14 0754  . busPIRone (BUSPAR) tablet 10 mg  10 mg Oral BID Mojeed Akintayo   10 mg at 09/08/14 0754  . chlordiazePOXIDE (LIBRIUM) capsule 25 mg  25 mg Oral TID PRN Verne Spurr, PA-C   25 mg at 09/07/14 2151  . elvitegravir-cobicistat-emtricitabine-tenofovir (GENVOYA)  150-150-200-10 MG tablet 1 tablet  1 tablet Oral Q breakfast Mojeed Akintayo   1 tablet at 09/08/14 0754  . Influenza vac split quadrivalent PF (FLUARIX) injection 0.5 mL  0.5 mL Intramuscular Tomorrow-1000 Rachael FeeIrving A Nylen Creque, MD   0.5 mL at 09/05/14 1734  . magnesium hydroxide (MILK OF MAGNESIA) suspension 30 mL  30 mL Oral Daily PRN Rachael FeeIrving A Cerria Randhawa, MD      . multivitamin with minerals tablet 1 tablet  1 tablet Oral Daily Rachael FeeIrving A Symeon Puleo, MD   1  tablet at 09/08/14 0754  . nicotine (NICODERM CQ - dosed in mg/24 hours) patch 21 mg  21 mg Transdermal Daily Rachael FeeIrving A Terrina Docter, MD   21 mg at 09/08/14 0754  . thiamine (VITAMIN B-1) tablet 100 mg  100 mg Oral Daily Rachael FeeIrving A Laquida Cotrell, MD   100 mg at 09/08/14 0754  . traZODone (DESYREL) tablet 100 mg  100 mg Oral QHS PRN Rachael FeeIrving A Alonna Bartling, MD   100 mg at 09/07/14 2151    Lab Results: No results found for this or any previous visit (from the past 48 hour(s)).  Physical Findings: AIMS: Facial and Oral Movements Muscles of Facial Expression: None, normal Lips and Perioral Area: None, normal Jaw: None, normal Tongue: None, normal,Extremity Movements Upper (arms, wrists, hands, fingers): None, normal Lower (legs, knees, ankles, toes): None, normal, Trunk Movements Neck, shoulders, hips: None, normal, Overall Severity Severity of abnormal movements (highest score from questions above): None, normal Incapacitation due to abnormal movements: None, normal Patient's awareness of abnormal movements (rate only patient's report): No Awareness, Dental Status Current problems with teeth and/or dentures?: No Does patient usually wear dentures?: No  CIWA:  CIWA-Ar Total: 0 COWS:  COWS Total Score: 1  Treatment Plan Summary: Daily contact with patient to assess and evaluate symptoms and progress in treatment Medication management  Plan: Supportive approach/coping skills           Polysubstance Dependence: develop a relapse prevention plan                                                               Explore residential treatment options           Depression: continue the Abilify/Trazodone combination           Anxiety: continue the Buspar           Continue to work on CBT/mindfulness strategies  Medical Decision Making Problem Points:  Review of psycho-social stressors (1) Data Points:  Review of medication regiment & side effects (2)  I certify that inpatient services furnished can reasonably be expected  to improve the patient's condition.   [name] 09/08/2014, 12:45 PM

## 2014-09-08 NOTE — Progress Notes (Signed)
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

## 2014-09-08 NOTE — Clinical Social Work Note (Addendum)
CSW spoke with patient Engineer, Henry [name], Henry [name] [phone]- [phone], to advised of patient interested in residential treatment but needing court date extended to get into a program.  [name] advised he would talk with the public offender to get the court date changed and did not see a problem.  CSW to call him back with name of program he will need to forward a letter to in regard to change in court date.  CSW [facility] with [facility] who advised they can not take patient due to an assault charge and the case would need to be resolved before they could accept him.  [facility].  Patient is discharging today. CSW will notify [name] if patient accepted.  CSW contact [facility] and was advised no beds available until February.

## 2014-09-08 NOTE — Progress Notes (Deleted)
[facility] :  Will you be returning to the same living situation after discharge: Yes,  Patient is returning to his home. At discharge, do you have transportation home?:Yes,  Patient is arranging transportation home Do you have the ability to pay for your medications: No, patient will be assisted with indigent medications.   Release of information consent forms completed and in the chart;  Patient's signature needed at discharge.  Patient to Follow up at: Follow-up Information    [facility] 09[facility]/2014.   Why:  [facility] on Thursday, September 09, 2014 or any weekday between 8AM - 12Noon or 1PM-3PM for medication management counseling   Contact information:   [address] [place], [place]   [address]  [phone]      Patient denies SI/HI:  Patient no longer endorsing SI/HI or other thoughts of self harm.   Safety Planning and Suicide Prevention discussed: .Reviewed with all patients during discharge planning group   Patient refused referral  Wynn BankerHodnett, Adriyana Greenbaum Hairston 09/08/2014, 10:58 AM

## 2014-09-08 NOTE — [facility] Group Notes (Signed)
Adult Psychoeducational Group Note  Date:  [name] Time:  Henry [name]  Group Topic/Focus:  NA Meeting  Participation Level:  Did Not Attend  Participation Quality:  None  Affect:  None  Cognitive:  None  Insight: None  Engagement in Group:  None  Modes of Intervention:  Discussion and Education  Additional Comments:  Henry [name] did not attend group.  Caroll RancherLindsay, Henry [name], Henry [name]

## 2014-09-09 DIAGNOSIS — F1410 Cocaine abuse, uncomplicated: Secondary | ICD-10-CM

## 2014-09-09 DIAGNOSIS — F339 Major depressive disorder, recurrent, unspecified: Secondary | ICD-10-CM

## 2014-09-09 MED ORDER — BUSPIRONE HCL 10 MG PO TABS: 10.0000 mg | ORAL_TABLET | Freq: Two times a day (BID) | ORAL | Status: DC

## 2014-09-09 MED ORDER — ARIPIPRAZOLE 5 MG PO TABS: 5.0000 mg | ORAL_TABLET | Freq: Every day | ORAL | Status: DC

## 2014-09-09 MED ORDER — TRAZODONE HCL 100 MG PO TABS: 100.0000 mg | ORAL_TABLET | Freq: Every evening | ORAL | Status: DC | PRN

## 2014-09-09 MED ORDER — ELVITEG-COBIC-EMTRICIT-TENOFAF 150-150-200-10 MG PO TABS: 1.0000 | ORAL_TABLET | Freq: Every day | ORAL | Status: DC

## 2014-09-09 NOTE — Progress Notes (Signed)
[facility] :  Will you be returning to the same living situation after discharge: Yes, patient is returning home. At discharge, do you have transportation home?:Yes,  Patient has assess to transportation Do you have the ability to pay for your medications:Yes,  Patient has Medicaid.  Release of information consent forms completed and in the chart;  Patient's signature needed at discharge.  Patient to Follow up at: Follow-up Information    [facility] with [facility] On 09/10/2014.   Why:  [facility] Thursday, September 10, 2014 or any weekday between 8AM - 12Noon or 1PM-3PM for medication management counseling   Contact information:   [address] [place], [place]   [address]  [phone]      Patient denies SI/HI:  Patient no longer endorsing SI/HI or other thoughts of self harm.  Safety Planning and Suicide Prevention discussed: .Reviewed with all patients during discharge planning group   N/A patient is not a smoker  Tyrian Peart, Joesph JulyQuylle Hairston 09/09/2014, 12:55 PM

## 2014-09-09 NOTE — Discharge Summary (Signed)
Physician Discharge Summary Note  Patient:  Henry [name] is an 34 y.o., male MRN:  [id number] DOB:  11/23/1980 Patient phone:  [phone] (home)  Patient address:   [address] [place] [address],  Total Time spent with patient: 45 minutes  Date of Admission:  09/03/2014 Date of Discharge: 09/09/2014  Reason for Admission:  Patient presented to ED reporting increased alcohol and cocaine abuse, accompanied with sister and his mother. He gives conflicting statements to different providers. He overtly denies SI, but feels that his lack of medical compliance with his HIV drugs will lead to an early death for him. He also reports crying spells, insomnia, fatigue, decreased appetite, social withdrawal, loss of interest in usual pleasures and feelings of sadness, guilt, worthlessness and hopelessness. He states he will go 2-3 days without eating. He also reports he will hit himself in the head when upset.   Discharge Diagnoses: Principal Problem:   ETOH abuse Active Problems:   Major depressive disorder, recurrent episode, moderate   Generalized anxiety disorder   Polysubstance dependence including opioid type drug, episodic abuse   Major depression, recurrent   Psychiatric Specialty Exam: Physical Exam  Constitutional: He is oriented to person, place, and time. He appears well-developed.  Neck: Normal range of motion. Neck supple.  Musculoskeletal: Normal range of motion.  Neurological: He is alert and oriented to person, place, and time.  Skin: Skin is warm and dry.    Review of Systems  All other systems reviewed and are negative.   Blood pressure 126/82, pulse 94, temperature 97.8 F (36.6 C), temperature source Oral, resp. rate 16, height 6' (1.829 m), weight 87.998 kg (194 lb).Body mass index is 26.31 kg/(m^2).  SEE MD PSE in the Suicide Risk Assessment                                                 Past Psychiatric History:  Diagnosis: MDD,  polysubstance abuse  Hospitalizations: Denies   Outpatient Care: Denies   Substance Abuse Care:Denies   Self-Mutilation: Denies   Suicidal Attempts: Yes ( OD)   Violent Behaviors: Denies     Musculoskeletal: SEE MD Suicide Risk Assessment  DSM5:  Substance/Addictive Disorders: Alcohol Related Disorder - Mild (305.00) Cocaine abuse Depressive Disorders: Major Depressive Disorder - Moderate (296.22)  AXIS I: MDD recurrent w/o psychotic features, Alcohol Related disorder mild, cocaine abuse AXIS II: Deferred AXIS III:  Past Medical History  Diagnosis Date  . HIV (human immunodeficiency virus infection)    AXIS IV: other psychosocial or environmental problems, problems related to social environment and problems with primary support group AXIS V: 51-60 moderate symptoms   Level of Care:  OP  Hospital Course:  Medication management, group and milieu therapy, and detox treatment.   Consults:  psychiatry  Significant Diagnostic Studies:  None  Discharge Vitals:   Blood pressure 126/82, pulse 94, temperature 97.8 F (36.6 C), temperature source Oral, resp. rate 16, height 6' (1.829 m), weight 87.998 kg (194 lb). Body mass index is 26.31 kg/(m^2). Lab Results:   No results found for this or any previous visit (from the past 72 hour(s)).  Physical Findings: AIMS: Facial and Oral Movements Muscles of Facial Expression: None, normal Lips and Perioral Area: None, normal Jaw: None, normal Tongue: None, normal,Extremity Movements Upper (arms, wrists, hands, fingers): None, normal Lower (legs, knees, ankles,  toes): None, normal, Trunk Movements Neck, shoulders, hips: None, normal, Overall Severity Severity of abnormal movements (highest score from questions above): None, normal Incapacitation due to abnormal movements: None, normal Patient's awareness of abnormal movements (rate only patient's report): No Awareness, Dental Status Current problems with teeth and/or  dentures?: No Does patient usually wear dentures?: No  CIWA:  CIWA-Ar Total: 0 COWS:  COWS Total Score: 1  Psychiatric Specialty Exam: See Psychiatric Specialty Exam and Suicide Risk Assessment completed by Attending Physician prior to discharge.  Discharge destination:  Home  Is patient on multiple antipsychotic therapies at discharge:  No   Has Patient had three or more failed trials of antipsychotic monotherapy by history:  No  Recommended Plan for Multiple Antipsychotic Therapies: NA     Medication List    STOP taking these medications        acidophilus Caps capsule     loperamide 2 MG capsule  Commonly known as:  IMODIUM     multivitamin with minerals Tabs tablet     STRIBILD 150-150-200-300 MG Tabs tablet  Generic drug:  elvitegravir-cobicistat-emtricitabine-tenofovir      TAKE these medications      Indication   ARIPiprazole 5 MG tablet  Commonly known as:  ABILIFY  Take 1 tablet (5 mg total) by mouth daily.   Indication:  Major Depressive Disorder     busPIRone 10 MG tablet  Commonly known as:  BUSPAR  Take 1 tablet (10 mg total) by mouth 2 (two) times daily.   Indication:  Generalized Anxiety Disorder     elvitegravir-cobicistat-emtricitabine-tenofovir 150-150-200-10 MG Tabs tablet  Commonly known as:  GENVOYA  Take 1 tablet by mouth daily with breakfast.   Indication:  HIV Disease     traZODone 100 MG tablet  Commonly known as:  DESYREL  Take 1 tablet (100 mg total) by mouth at bedtime as needed for sleep.   Indication:  Trouble Sleeping       Follow-up Information    [facility] with [facility] On 09/09/2014.   Why:  Please [facility] to [facility] walk in clinic on Thursday, September 09, 2014 or any weekday between 8AM - 12Noon or 1PM-3PM for medication management counseling   Contact information:   [address] [place], [place]   [address]  [phone]      Follow up with [facility].   Why:  If you are able to get your court date continued,  please call [facility] at [phone] or call [facility] at [phone] to see if they have a bed availab.e.   Contact information:   [address] [place], [place]   [address]  [phone]      Follow-up recommendations:  Activity:  Increase activity as tolerated.  Diet:  Regular diet Other:  Please keep scheduled appts.   Comments:  Take all medications as prescribed. Keep all follow-up appointments as scheduled.  Do not consume alcohol or use illegal drugs while on prescription medications. Report any adverse effects from your medications to your primary care provider promptly.  In the event of recurrent symptoms or worsening symptoms, call 911, a crisis hotline, or go to the nearest emergency department for evaluation.   Total Discharge Time:  Greater than 30 minutes.  Signed:  Beau Fanny, FNP-BC 09/09/2014 11:07 AM  I personally assessed the patient and formulated the plan Madie Reno A. Dub Mikes, M.D.

## 2014-09-09 NOTE — Progress Notes (Signed)
Patient discharged per physician order; patient denies SI/HI and A/V hallucinations; patient received copy of AVS, samples, and prescriptions after it was reviewed; patient had no other questions or concerns at this time; patient verbalized and signed that he received all belongings; patient left the unit ambulatory

## 2014-09-09 NOTE — [facility] Group Notes (Signed)
[facility] LCSW Group Therapy  [employer] of  1:15 - 2:30 PM  09/09/2014 1:59 PM   Type of Therapy:  Group Therapy  Participation Level:  Minimal  Participation Quality:  Attentive  Affect:  Appropriate  Cognitive:  Appropriate  Insight:  Developing/Improving   Engagement in Therapy:  Developing/Improving   Modes of Intervention:  Discussion, Education, Exploration, Problem-Solving, Rapport Building, Support   Summary of Progress/Problems:   Patient was attentive to speaker from the Mental health Association as he shared his story of dealing with mental health/substance abuse issues and overcoming it by working a recovery program.  Patient made no comments on the presentation.  Wynn BankerHodnett, Kaylum Shrum Hairston 09/09/2014 1:59 PM

## 2014-09-09 NOTE — [facility] Group Notes (Signed)
[facility] Group Notes:  (Nursing/MHT/Case Management/Adjunct)  Date:  09/09/2014  Time:  0915   Type of Therapy:  Psychoeducational Skills  Participation Level:  Active  Participation Quality:  Appropriate  Affect:  Appropriate  Cognitive:  Alert  Insight:  Good  Engagement in Group:  Engaged  Modes of Intervention:  Discussion, Education, Exploration and Support  Summary of Progress/Problems: Pt able to identify current feelings. Pt also identified one leisure activity and one healthy lifestyle change to make once discharged.   [name], Henry Barstow E 09/09/2014, 10:52 AM 

## 2014-09-09 NOTE — Progress Notes (Signed)
Pt attended the AA speaker meeting. 
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
## 2014-09-13 NOTE — Progress Notes (Signed)
Patient Discharge Instructions:  After Visit Summary (AVS):   Faxed to:  09/13/14 Discharge Summary Note:   Faxed to:  09/13/14 Psychiatric Admission Assessment Note:   Faxed to:  09/13/14 Suicide Risk Assessment - Discharge Assessment:   Faxed to:  09/13/14 Faxed/Sent to the Next Level Care provider:  09/13/14 [facility] to [facility] @ [phone]  Jerelene ReddenSheena E DeWitt, 09/13/2014, 2:05 PM

## 2014-11-09 ENCOUNTER — Other Ambulatory Visit (INDEPENDENT_AMBULATORY_CARE_PROVIDER_SITE_OTHER): Payer: Self-pay

## 2014-11-09 DIAGNOSIS — B20 Human immunodeficiency virus [HIV] disease: Secondary | ICD-10-CM

## 2014-11-09 LAB — CBC WITH DIFFERENTIAL/PLATELET
BASOS PCT: 1 % (ref 0–1)
Basophils Absolute: 0 10*3/uL (ref 0.0–0.1)
Eosinophils Absolute: 0 10*3/uL (ref 0.0–0.7)
Eosinophils Relative: 1 % (ref 0–5)
HEMATOCRIT: 48 % (ref 39.0–52.0)
Hemoglobin: 17.1 g/dL — ABNORMAL HIGH (ref 13.0–17.0)
Lymphocytes Relative: 37 % (ref 12–46)
Lymphs Abs: 1.3 10*3/uL (ref 0.7–4.0)
MCH: 34.1 pg — AB (ref 26.0–34.0)
MCHC: 35.6 g/dL (ref 30.0–36.0)
MCV: 95.6 fL (ref 78.0–100.0)
MONO ABS: 0.2 10*3/uL (ref 0.1–1.0)
MONOS PCT: 7 % (ref 3–12)
MPV: 9.4 fL (ref 8.6–12.4)
Neutro Abs: 1.9 10*3/uL (ref 1.7–7.7)
Neutrophils Relative %: 54 % (ref 43–77)
Platelets: 173 10*3/uL (ref 150–400)
RBC: 5.02 MIL/uL (ref 4.22–5.81)
RDW: 13.7 % (ref 11.5–15.5)
WBC: 3.5 10*3/uL — ABNORMAL LOW (ref 4.0–10.5)

## 2014-11-09 LAB — COMPLETE METABOLIC PANEL WITH GFR
ALK PHOS: 54 U/L (ref 39–117)
ALT: 17 U/L (ref 0–53)
AST: 26 U/L (ref 0–37)
Albumin: 4.5 g/dL (ref 3.5–5.2)
BILIRUBIN TOTAL: 1.2 mg/dL (ref 0.2–1.2)
BUN: 14 mg/dL (ref 6–23)
CO2: 29 meq/L (ref 19–32)
CREATININE: 1.2 mg/dL (ref 0.50–1.35)
Calcium: 9 mg/dL (ref 8.4–10.5)
Chloride: 102 mEq/L (ref 96–112)
GFR, EST NON AFRICAN AMERICAN: 78 mL/min
GFR, Est African American: >89 mL/min
Glucose, Bld: 107 mg/dL — ABNORMAL HIGH (ref 70–99)
Potassium: 4 mEq/L (ref 3.5–5.3)
Sodium: 139 mEq/L (ref 135–145)
Total Protein: 6.7 g/dL (ref 6.0–8.3)

## 2014-11-10 LAB — T-HELPER CELL (CD4) - (RCID CLINIC ONLY)
CD4 % Helper T Cell: 30 % — ABNORMAL LOW (ref 33–55)
CD4 T Cell Abs: 410 /uL (ref 400–2700)

## 2014-11-11 LAB — HIV-1 RNA QUANT-NO REFLEX-BLD: HIV 1 RNA Quant: <20 copies/mL (ref <20)

## 2014-11-15 ENCOUNTER — Other Ambulatory Visit: Payer: Self-pay | Admitting: *Deleted

## 2014-11-15 DIAGNOSIS — Z113 Encounter for screening for infections with a predominantly sexual mode of transmission: Secondary | ICD-10-CM

## 2014-11-22 ENCOUNTER — Ambulatory Visit: Payer: Self-pay | Admitting: Internal Medicine

## 2014-11-23 ENCOUNTER — Ambulatory Visit: Payer: Self-pay | Admitting: Internal Medicine

## 2014-11-23 ENCOUNTER — Other Ambulatory Visit: Payer: Self-pay | Admitting: Internal Medicine

## 2014-11-24 ENCOUNTER — Telehealth: Payer: Self-pay | Admitting: *Deleted

## 2014-11-24 NOTE — Telephone Encounter (Signed)
[place], [employer] pays for the medication.  Stribild was stopped when Genvoya was covered by [employer].

## 2014-11-25 ENCOUNTER — Other Ambulatory Visit: Payer: Self-pay | Admitting: *Deleted

## 2014-11-25 DIAGNOSIS — B20 Human immunodeficiency virus [HIV] disease: Secondary | ICD-10-CM

## 2014-11-25 MED ORDER — ELVITEG-COBIC-EMTRICIT-TENOFAF 150-150-200-10 MG PO TABS: 1.0000 | ORAL_TABLET | Freq: Every day | ORAL | Status: DC

## 2014-11-25 NOTE — Telephone Encounter (Addendum)
Spoke with Henry [name], confirmed with both her and [facility] that patient should be on Genvoya only.  RN called [facility] to discontinue the stribild script on file to clarify the confusion.  [facility] reconciled the medication profile, is filling Genvoya. Patient had labs drawn 3/8, was supposed to follow up 3/22 but was incarcerated.  Please advise when the patient should follow up at [facility]. VL<20, CD4=410. [name], [name], RN

## 2014-11-25 NOTE — Telephone Encounter (Signed)
Pt missed appt on 11/23/14.  Jail can call to schedule appt for pt with Dr. Luciana Axeomer.  Left number to call for appt.

## 2015-01-20 ENCOUNTER — Other Ambulatory Visit: Payer: Self-pay

## 2015-02-10 ENCOUNTER — Encounter: Payer: Self-pay | Admitting: Internal Medicine

## 2015-02-10 ENCOUNTER — Ambulatory Visit (INDEPENDENT_AMBULATORY_CARE_PROVIDER_SITE_OTHER): Payer: Self-pay | Admitting: Internal Medicine

## 2015-02-10 VITALS — BP 137/80 | HR 97 | Temp 98.1°F | Ht 73.0 in | Wt 182.0 lb

## 2015-02-10 DIAGNOSIS — Z23 Encounter for immunization: Secondary | ICD-10-CM

## 2015-02-10 DIAGNOSIS — B20 Human immunodeficiency virus [HIV] disease: Secondary | ICD-10-CM

## 2015-02-10 LAB — COMPLETE METABOLIC PANEL WITH GFR
ALBUMIN: 4 g/dL (ref 3.5–5.2)
ALK PHOS: 52 U/L (ref 39–117)
ALT: 30 U/L (ref 0–53)
AST: 30 U/L (ref 0–37)
BUN: 10 mg/dL (ref 6–23)
CO2: 29 meq/L (ref 19–32)
Calcium: 9 mg/dL (ref 8.4–10.5)
Chloride: 99 mEq/L (ref 96–112)
Creat: 1.17 mg/dL (ref 0.50–1.35)
GFR, EST NON AFRICAN AMERICAN: 81 mL/min
GFR, Est African American: >89 mL/min
Glucose, Bld: 88 mg/dL (ref 70–99)
Potassium: 3.5 mEq/L (ref 3.5–5.3)
Sodium: 139 mEq/L (ref 135–145)
Total Bilirubin: 0.5 mg/dL (ref 0.2–1.2)
Total Protein: 7.1 g/dL (ref 6.0–8.3)

## 2015-02-10 NOTE — Progress Notes (Signed)
Subjective:    Patient ID: Henry [name], male    DOB: Sep 08, 1980, 34 y.o.   MRN: [id number]  HPI Henry [name] is a 34 y/o male who recently just got out of jail and is here for routine HIV follow up. He was switched to [facility] in March and missed a few days due to jail not having the meds. He has been taking Genvoya every day since the jail got the medication and he is tolerating it well. His last VL/CD4 was prior to starting Genvoya so we will check this today. He has been undetectable. He reports that he is now going to [place] for counseling services. He is currently smoking 1/2ppd of cigarettes and is not ready to quit. He has had a couple of sexual partners since bing released from jail 2 weeks ago. He has been using condoms. Denies any symptoms of penile drainage, genital or anal sores. He is complaining of shoulder pain that started when he slept on cots in jail. He states the pain has been slowly resolving since he got out. He takes excedrin as needed for the pain.    Outpatient Encounter Prescriptions as of 02/10/2015  Medication Sig Note  . ARIPiprazole (ABILIFY) 5 MG tablet Take 1 tablet (5 mg total) by mouth daily.   . busPIRone (BUSPAR) 10 MG tablet Take 1 tablet (10 mg total) by mouth 2 (two) times daily.   Marland Kitchen elvitegravir-cobicistat-emtricitabine-tenofovir (GENVOYA) 150-150-200-10 MG TABS tablet Take 1 tablet by mouth daily with breakfast.   . [DISCONTINUED] traZODone (DESYREL) 100 MG tablet Take 1 tablet (100 mg total) by mouth at bedtime as needed for sleep.   . traZODone (DESYREL) 50 MG tablet TK 1 T PO  QHS 02/10/2015: Received from: External Pharmacy   No facility-administered encounter medications on file as of 02/10/2015.     Review of Systems  Constitutional: Negative for fever, chills, diaphoresis, appetite change, fatigue and unexpected weight change.  HENT: Negative for dental problem, mouth sores and trouble swallowing.   Eyes: Negative for visual disturbance.  Respiratory:  Negative for cough and shortness of breath.   Cardiovascular: Negative for chest pain.  Gastrointestinal: Negative for nausea, vomiting, abdominal pain, diarrhea, constipation and abdominal distention.  Genitourinary: Negative for difficulty urinating.  Musculoskeletal: Negative for myalgias and arthralgias.  Skin: Negative for rash and wound.  Neurological: Negative for dizziness and weakness.  Hematological: Negative for adenopathy.  Psychiatric/Behavioral: Negative for sleep disturbance.       Hx depression, recently d/c'd from jail, seeing [facility] for counseling       Objective:   Physical Exam  Constitutional: He is oriented to person, place, and time. He appears well-developed and well-nourished.  HENT:  Head: Normocephalic and atraumatic.  Mouth/Throat: No oropharyngeal exudate.  Eyes: Conjunctivae are normal. Pupils are equal, round, and reactive to light.  Neck: Normal range of motion. Neck supple.  Cardiovascular: Normal rate and regular rhythm.   Pulmonary/Chest: Effort normal and breath sounds normal.  Abdominal: Soft. Bowel sounds are normal.  Musculoskeletal: Normal range of motion.  Lymphadenopathy:    He has no cervical adenopathy.  Neurological: He is alert and oriented to person, place, and time.  Skin: Skin is warm and dry.   Lab Results  Component Value Date   CD4TABS 410 11/09/2014   CD4TABS 360* 03/08/2014   CD4TABS 260* 10/20/2013   Lab Results  Component Value Date   HIV1RNAQUANT <20 11/09/2014   Filed Vitals:   02/10/15 1512  Height: 6\' 1"  (1.854  m)  Weight: 182 lb (82.555 kg)          Assessment & Plan:  Assessment: [name] is tolerating his Genvoya very well. He will need labs today. He has sought out counseling services and knows that [facility]er is able to help him with smoking cessation. He will think about using this service once he gets settled.  Plan: Continue Genvoya Check labs today RTC in 3 months Given condoms  Discussed  smoking cessation and X 5 minutes. Not ready to quit.

## 2015-02-11 LAB — CBC WITH DIFFERENTIAL/PLATELET
Basophils Absolute: 0.1 10*3/uL (ref 0.0–0.1)
Basophils Relative: 1 % (ref 0–1)
Eosinophils Absolute: 0.1 10*3/uL (ref 0.0–0.7)
Eosinophils Relative: 1 % (ref 0–5)
HCT: 43.6 % (ref 39.0–52.0)
Hemoglobin: 15.5 g/dL (ref 13.0–17.0)
Lymphocytes Relative: 21 % (ref 12–46)
Lymphs Abs: 1.3 10*3/uL (ref 0.7–4.0)
MCH: 33.6 pg (ref 26.0–34.0)
MCHC: 35.6 g/dL (ref 30.0–36.0)
MCV: 94.6 fL (ref 78.0–100.0)
MPV: 8.8 fL (ref 8.6–12.4)
Monocytes Absolute: 0.6 10*3/uL (ref 0.1–1.0)
Monocytes Relative: 9 % (ref 3–12)
NEUTROS ABS: 4.2 10*3/uL (ref 1.7–7.7)
Neutrophils Relative %: 68 % (ref 43–77)
Platelets: 272 10*3/uL (ref 150–400)
RBC: 4.61 MIL/uL (ref 4.22–5.81)
RDW: 14 % (ref 11.5–15.5)
WBC: 6.2 10*3/uL (ref 4.0–10.5)

## 2015-02-11 LAB — T-HELPER CELL (CD4) - (RCID CLINIC ONLY)
CD4 % Helper T Cell: 29 % — ABNORMAL LOW (ref 33–55)
CD4 T Cell Abs: 420 /uL (ref 400–2700)

## 2015-02-11 LAB — HIV-1 RNA QUANT-NO REFLEX-BLD

## 2015-02-11 NOTE — Addendum Note (Signed)
Addended by: Andree Coss on: 02/11/2015 04:55 PM   Modules accepted: Orders

## 2015-03-14 ENCOUNTER — Ambulatory Visit (INDEPENDENT_AMBULATORY_CARE_PROVIDER_SITE_OTHER): Payer: Self-pay | Admitting: *Deleted

## 2015-03-14 DIAGNOSIS — Z23 Encounter for immunization: Secondary | ICD-10-CM

## 2015-04-04 ENCOUNTER — Emergency Department (HOSPITAL_COMMUNITY)
Admission: EM | Admit: 2015-04-04 | Discharge: 2015-04-04 | Disposition: A | Discharge status: Home / Self Care | Payer: Self-pay | Attending: Emergency Medicine | Admitting: Emergency Medicine

## 2015-04-04 ENCOUNTER — Encounter (HOSPITAL_COMMUNITY): Payer: Self-pay | Admitting: Nurse Practitioner

## 2015-04-04 DIAGNOSIS — Z21 Asymptomatic human immunodeficiency virus [HIV] infection status: Secondary | ICD-10-CM | POA: Insufficient documentation

## 2015-04-04 DIAGNOSIS — Z79899 Other long term (current) drug therapy: Secondary | ICD-10-CM | POA: Insufficient documentation

## 2015-04-04 DIAGNOSIS — Z720 Tobacco use: Secondary | ICD-10-CM | POA: Insufficient documentation

## 2015-04-04 DIAGNOSIS — K6289 Other specified diseases of anus and rectum: Secondary | ICD-10-CM | POA: Insufficient documentation

## 2015-04-04 DIAGNOSIS — K649 Unspecified hemorrhoids: Secondary | ICD-10-CM | POA: Insufficient documentation

## 2015-04-04 DIAGNOSIS — K625 Hemorrhage of anus and rectum: Secondary | ICD-10-CM | POA: Insufficient documentation

## 2015-04-04 LAB — CBC
HEMATOCRIT: 44.1 % (ref 39.0–52.0)
HEMOGLOBIN: 15.2 g/dL (ref 13.0–17.0)
MCH: 33.4 pg (ref 26.0–34.0)
MCHC: 34.5 g/dL (ref 30.0–36.0)
MCV: 96.9 fL (ref 78.0–100.0)
Platelets: 164 10*3/uL (ref 150–400)
RBC: 4.55 MIL/uL (ref 4.22–5.81)
RDW: 13.1 % (ref 11.5–15.5)
WBC: 4.4 10*3/uL (ref 4.0–10.5)

## 2015-04-04 LAB — COMPREHENSIVE METABOLIC PANEL
ALT: 17 U/L (ref 17–63)
AST: 21 U/L (ref 15–41)
Albumin: 3.2 g/dL — ABNORMAL LOW (ref 3.5–5.0)
Alkaline Phosphatase: 43 U/L (ref 38–126)
Anion gap: 7 (ref 5–15)
CALCIUM: 8.5 mg/dL — AB (ref 8.9–10.3)
CHLORIDE: 103 mmol/L (ref 101–111)
CO2: 28 mmol/L (ref 22–32)
Creatinine, Ser: 0.9 mg/dL (ref 0.61–1.24)
GFR calc Af Amer: >60 mL/min (ref >60)
Glucose, Bld: 101 mg/dL — ABNORMAL HIGH (ref 65–99)
Potassium: 3.5 mmol/L (ref 3.5–5.1)
Sodium: 138 mmol/L (ref 135–145)
Total Bilirubin: 0.4 mg/dL (ref 0.3–1.2)
Total Protein: 6.1 g/dL — ABNORMAL LOW (ref 6.5–8.1)

## 2015-04-04 LAB — TYPE AND SCREEN
ABO/RH(D): A POS
ANTIBODY SCREEN: NEGATIVE

## 2015-04-04 LAB — ABO/RH: ABO/RH(D): A POS

## 2015-04-04 MED ORDER — HYDROCODONE-ACETAMINOPHEN 5-325 MG PO TABS: 1.0000 | ORAL_TABLET | Freq: Four times a day (QID) | ORAL | Status: DC | PRN

## 2015-04-04 NOTE — ED Notes (Signed)
NAD at this time. Pt is stable and going home.  

## 2015-04-04 NOTE — ED Provider Notes (Addendum)
CSN: [id number]     Arrival date & time 04/04/15  1118 History   First MD Initiated Contact with Patient 04/04/15 1149     Chief Complaint  Patient presents with  . Rectal Bleeding     (Consider location/radiation/quality/duration/timing/severity/associated sxs/prior Treatment) HPI Comments: Patient is a 34 year old male with history of HIV disease. He presents for evaluation of rectal pain. He states that he had anal intercourse one week ago and since that time has had pain and bleeding. He denies any abdominal pain he denies any fevers or chills.  Patient is a 34 y.o. male presenting with hematochezia. The history is provided by the patient.  Rectal Bleeding Quality: Bright red. Amount:  Moderate Duration:  1 week Timing:  Constant Progression:  Worsening Chronicity:  New Context: anal penetration   Context: not anal fissures and not constipation   Similar prior episodes: no   Relieved by:  Nothing Worsened by:  Defecation and wiping Ineffective treatments:  Hemorrhoid cream   Past Medical History  Diagnosis Date  . HIV (human immunodeficiency virus infection)    History reviewed. No pertinent past surgical history. Family History  Problem Relation Age of Onset  . Thyroid disease Mother   . Heart disease Maternal Grandmother   . Breast cancer Maternal Grandmother    History  Substance Use Topics  . Smoking status: Current Every Day Smoker -- 1.00 packs/day for 14 years    Types: Cigarettes  . Smokeless tobacco: Never Used     Comment: trying to cut back  . Alcohol Use: 0.0 oz/week    0 Standard drinks or equivalent per week     Comment: 12 cans a day    Review of Systems  Gastrointestinal: Positive for hematochezia.  All other systems reviewed and are negative.     Allergies  Review of patient's allergies indicates no known allergies.  Home Medications   Prior to Admission medications   Medication Sig Start Date End Date Taking? Authorizing Provider   ARIPiprazole (ABILIFY) 5 MG tablet Take 1 tablet (5 mg total) by mouth daily. 09/09/14   Beau Fanny, FNP  busPIRone (BUSPAR) 10 MG tablet Take 1 tablet (10 mg total) by mouth 2 (two) times daily. 09/09/14   Beau Fanny, FNP  elvitegravir-cobicistat-emtricitabine-tenofovir (GENVOYA) 150-150-200-10 MG TABS tablet Take 1 tablet by mouth daily with breakfast. 11/25/14   Gardiner Barefoot, MD  traZODone (DESYREL) 50 MG tablet TK 1 T PO  QHS 01/26/15   Historical Provider, MD   BP 143/92 mmHg  Pulse 77  Temp(Src) 98.6 F (37 C) (Oral)  Resp 18  Ht 6\' 1"  (1.854 m)  Wt 198 lb (89.812 kg)  BMI 26.13 kg/m2  SpO2 97% Physical Exam  Constitutional: He is oriented to person, place, and time. He appears well-developed and well-nourished. No distress.  HENT:  Head: Normocephalic and atraumatic.  Neck: Normal range of motion. Neck supple.  Abdominal: Soft. Bowel sounds are normal. He exhibits no distension. There is no tenderness.  Genitourinary:  There is a large hemorrhoid noted, however no evidence for thrombosis or active bleeding. There is no obvious fissure or other palpable abnormality or rectal exam.  Neurological: He is alert and oriented to person, place, and time.  Skin: Skin is warm and dry. He is not diaphoretic.  Nursing note and vitals reviewed.   ED Course  Procedures (including critical care time) Labs Review Labs Reviewed  COMPREHENSIVE METABOLIC PANEL - Abnormal; Notable for the following:  Glucose, Bld 101 (*)    BUN <5 (*)    Calcium 8.5 (*)    Total Protein 6.1 (*)    Albumin 3.2 (*)    All other components within normal limits  CBC  TYPE AND SCREEN    Imaging Review No results found.   EKG Interpretation None      MDM   Final diagnoses:  None    Hemoglobin is normal the patient appears hemodynamically stable. He will be treated with stool softeners, steroids cream, astringents, and when necessary follow-up with gastroenterology to discuss a  colonoscopy.    Geoffery Lyons, MD 04/04/15 1315   ADDENDUM:  While the patient was being discharged, he informed the nurse that he was extremely unhappy with me in the care that he was provided. I return to the room to discuss this with him, how ever he repeatedly told me to "excuse myself". He demanded I leave the room and would not even discuss why he was unhappy with me. I feel as though I have effectively excluded any emergent pathology and have established the appropriate treatment and follow-up.  Geoffery Lyons, MD 04/04/15 1325

## 2015-04-04 NOTE — Discharge Instructions (Signed)
Metamucil:  1 heaping teaspoon in a glass of water 3 times daily indefinitely.  Colace (equate stool softener): 100 mg twice daily.  Hydrocodone as prescribed as needed for pain.  Follow-up with gastroenterology if your symptoms are not improving in the next few days. The contact information for [facility] has been provided in this discharge summary for you to call and arrange the appointment.   Rectal Bleeding Rectal bleeding is when blood passes out of the anus. It is usually a sign that something is wrong. It may not be serious, but it should always be evaluated. Rectal bleeding may present as bright red blood or extremely dark stools. The color may range from dark red or maroon to black (like tar). It is important that the cause of rectal bleeding be identified so treatment can be started and the problem corrected. CAUSES   Hemorrhoids. These are enlarged (dilated) blood vessels or veins in the anal or rectal area.  Fistulas. Theseare abnormal, burrowing channels that usually run from inside the rectum to the skin around the anus. They can bleed.  Anal fissures. This is a tear in the tissue of the anus. Bleeding occurs with bowel movements.  Diverticulosis. This is a condition in which pockets or sacs project from the bowel wall. Occasionally, the sacs can bleed.  Diverticulitis. Thisis an infection involving diverticulosis of the colon.  Proctitis and colitis. These are conditions in which the rectum, colon, or both, can become inflamed and pitted (ulcerated).  Polyps and cancer. Polyps are non-cancerous (benign) growths in the colon that may bleed. Certain types of polyps turn into cancer.  Protrusion of the rectum. Part of the rectum can project from the anus and bleed.  Certain medicines.  Intestinal infections.  Blood vessel abnormalities. HOME CARE INSTRUCTIONS  Eat a high-fiber diet to keep your stool soft.  Limit activity.  Drink enough fluids to keep your urine  clear or pale yellow.  Warm baths may be useful to soothe rectal pain.  Follow up with your caregiver as directed. SEEK IMMEDIATE MEDICAL CARE IF:  You develop increased bleeding.  You have black or dark red stools.  You vomit blood or material that looks like coffee grounds.  You have abdominal pain or tenderness.  You have a fever.  You feel weak, nauseous, or you faint.  You have severe rectal pain or you are unable to have a bowel movement. MAKE SURE YOU:  Understand these instructions.  Will watch your condition.  Will get help right away if you are not doing well or get worse. Document Released: 02/09/2002 Document Revised: 11/12/2011 Document Reviewed: 02/04/2011 [facility] Patient Information 2015 [place], [place]. This information is not intended to replace advice given to you by your health care provider. Make sure you discuss any questions you have with your health care provider.

## 2015-04-04 NOTE — ED Notes (Signed)
He c/o rectal bleeding and pain x 1 week , onset after rectal intercourse. He reports the blood is 'oozing' and since the intercourse hes had decreasing bowel movements and lower abd cramps.

## 2015-04-11 ENCOUNTER — Telehealth: Payer: Self-pay | Admitting: Licensed Clinical Social Worker

## 2015-04-11 NOTE — Telephone Encounter (Signed)
Patient having rectal bleeding and would like a referral to a GI doctor but does not have any insurance or orange card. Patient was instructed on who to contact and the phone number to get an orange card. I asked him to come by the office and bring the card so the referral can be made. Patient agreed with plan.

## 2015-04-16 ENCOUNTER — Other Ambulatory Visit: Payer: Self-pay | Admitting: Internal Medicine

## 2015-04-16 DIAGNOSIS — B20 Human immunodeficiency virus [HIV] disease: Secondary | ICD-10-CM

## 2015-04-18 NOTE — Telephone Encounter (Signed)
Patient received Orange card, it has been scanned into his chart.

## 2015-04-22 ENCOUNTER — Telehealth: Payer: Self-pay | Admitting: *Deleted

## 2015-04-22 DIAGNOSIS — K625 Hemorrhage of anus and rectum: Secondary | ICD-10-CM

## 2015-04-22 DIAGNOSIS — K649 Unspecified hemorrhoids: Secondary | ICD-10-CM

## 2015-04-22 NOTE — Telephone Encounter (Signed)
Requesting referral to GI MD to follow up on ED visit.  hemorrhoids found on exam at ED physical exam.  The pt has obtained an "[employer]" for this purpose.  MD please advise.

## 2015-04-25 NOTE — Telephone Encounter (Signed)
Ok to refer if there is a GI office that takes the orange card.

## 2015-04-25 NOTE — Telephone Encounter (Signed)
Referral placed in Epic.  Referral to [facility] faxed today.

## 2015-04-25 NOTE — Addendum Note (Signed)
Addended by: Jennet Maduro D on: 04/25/2015 04:08 PM   Modules accepted: Orders

## 2015-04-26 ENCOUNTER — Other Ambulatory Visit (INDEPENDENT_AMBULATORY_CARE_PROVIDER_SITE_OTHER): Payer: Self-pay

## 2015-04-26 DIAGNOSIS — Z113 Encounter for screening for infections with a predominantly sexual mode of transmission: Secondary | ICD-10-CM

## 2015-04-26 DIAGNOSIS — B20 Human immunodeficiency virus [HIV] disease: Secondary | ICD-10-CM

## 2015-04-27 LAB — RPR

## 2015-04-27 LAB — HIV-1 RNA QUANT-NO REFLEX-BLD: HIV 1 RNA Quant: <20 copies/mL (ref <20)

## 2015-04-27 LAB — T-HELPER CELL (CD4) - (RCID CLINIC ONLY)
CD4 % Helper T Cell: 25 % — ABNORMAL LOW (ref 33–55)
CD4 T Cell Abs: 390 /uL — ABNORMAL LOW (ref 400–2700)

## 2015-04-27 LAB — URINE CYTOLOGY ANCILLARY ONLY
CHLAMYDIA, DNA PROBE: NEGATIVE
Neisseria Gonorrhea: NEGATIVE

## 2015-05-07 ENCOUNTER — Other Ambulatory Visit: Payer: Self-pay | Admitting: Internal Medicine

## 2015-05-07 DIAGNOSIS — B20 Human immunodeficiency virus [HIV] disease: Secondary | ICD-10-CM

## 2015-05-16 ENCOUNTER — Ambulatory Visit: Payer: Self-pay | Admitting: Internal Medicine

## 2015-05-17 NOTE — Telephone Encounter (Signed)
Resent [facility] referral 9/1. Andree Coss, RN

## 2015-05-19 ENCOUNTER — Telehealth: Payer: Self-pay | Admitting: *Deleted

## 2015-05-19 NOTE — Telephone Encounter (Signed)
Patietn called Henry [name], THP casemanager, unsure where he was supposed to go for his GI referral via [facility].  RN called several numbers at [facility] attempting to find out, was unsuccessful.  [facility] later contacted [facility] to let us know that the patient no-showed today. RN explained what happened, patient is able to call and reschedule this month.  RN notified Cynda Familia will contact the patient to let him know. Andree Coss, RN

## 2015-06-08 NOTE — Telephone Encounter (Signed)
Patient has no-showed GI appointment twice.  Andree Coss, RN

## 2015-06-08 NOTE — Telephone Encounter (Signed)
[facility] called to let this clinic know that patient did not keep his appt with GI. (no show) Henry [name]

## 2015-07-07 ENCOUNTER — Telehealth: Payer: Self-pay | Admitting: *Deleted

## 2015-07-07 ENCOUNTER — Ambulatory Visit (INDEPENDENT_AMBULATORY_CARE_PROVIDER_SITE_OTHER): Payer: Self-pay | Admitting: Internal Medicine

## 2015-07-07 DIAGNOSIS — Z23 Encounter for immunization: Secondary | ICD-10-CM

## 2015-07-07 NOTE — Telephone Encounter (Signed)
phone has been disconnected.

## 2015-07-08 NOTE — Progress Notes (Signed)
error 

## 2015-07-14 ENCOUNTER — Encounter: Payer: Self-pay | Admitting: Internal Medicine

## 2015-07-14 ENCOUNTER — Ambulatory Visit (INDEPENDENT_AMBULATORY_CARE_PROVIDER_SITE_OTHER): Payer: Self-pay | Admitting: Internal Medicine

## 2015-07-14 VITALS — BP 131/89 | HR 82 | Temp 97.7°F | Ht 73.0 in | Wt 180.0 lb

## 2015-07-14 DIAGNOSIS — Z79899 Other long term (current) drug therapy: Secondary | ICD-10-CM

## 2015-07-14 DIAGNOSIS — F17200 Nicotine dependence, unspecified, uncomplicated: Secondary | ICD-10-CM

## 2015-07-14 DIAGNOSIS — Z113 Encounter for screening for infections with a predominantly sexual mode of transmission: Secondary | ICD-10-CM

## 2015-07-14 DIAGNOSIS — B20 Human immunodeficiency virus [HIV] disease: Secondary | ICD-10-CM

## 2015-07-14 LAB — LIPID PANEL
Cholesterol: 134 mg/dL (ref 125–200)
HDL: 50 mg/dL (ref >=40)
LDL CALC: 70 mg/dL (ref <130)
Total CHOL/HDL Ratio: 2.7 Ratio (ref <=5.0)
Triglycerides: 69 mg/dL (ref <150)
VLDL: 14 mg/dL (ref <30)

## 2015-07-15 LAB — T-HELPER CELL (CD4) - (RCID CLINIC ONLY)
CD4 T CELL ABS: 470 /uL (ref 400–2700)
CD4 T CELL HELPER: 27 % — AB (ref 33–55)

## 2015-07-15 NOTE — Progress Notes (Signed)
   Subjective:    Patient ID: Henry [name], male    DOB: 05/22/1981, 34 y.o.   MRN: [id number]  HPI Follow up on HIV.  Here for HIV follow up.  On Genvoya Since March 2016 and now taking daily.  Did miss some doses when he was briefly in jail prior to last appt. He is reducing his smoking.  No new issues, no complaints.     Review of Systems  Constitutional: Negative for fatigue.  Gastrointestinal: Negative for nausea and diarrhea.  Skin: Negative for rash.  Neurological: Negative for dizziness and light-headedness.       Objective:   Physical Exam  Constitutional: He appears well-developed and well-nourished. No distress.  HENT:  Mouth/Throat: No oropharyngeal exudate.  Eyes: No scleral icterus.  Cardiovascular: Normal rate, regular rhythm and normal heart sounds.   No murmur heard. Lymphadenopathy:    He has no cervical adenopathy.  Skin: No rash noted.          Assessment & Plan:

## 2015-07-15 NOTE — Assessment & Plan Note (Signed)
Trying to reduce smoking and I encouraged continued cessation.

## 2015-07-15 NOTE — Assessment & Plan Note (Signed)
Doing well.  Labs today and rtc 4 months unless concerns 

## 2015-07-19 LAB — HIV-1 RNA QUANT-NO REFLEX-BLD
HIV 1 RNA Quant: <20 copies/mL (ref <20)
HIV-1 RNA Quant, Log: <1.3 Log copies/mL (ref <1.30)

## 2015-08-15 ENCOUNTER — Ambulatory Visit: Payer: Self-pay

## 2015-10-15 ENCOUNTER — Other Ambulatory Visit: Payer: Self-pay | Admitting: Internal Medicine

## 2015-10-15 DIAGNOSIS — B20 Human immunodeficiency virus [HIV] disease: Secondary | ICD-10-CM

## 2015-10-31 ENCOUNTER — Other Ambulatory Visit: Payer: Self-pay

## 2015-11-14 ENCOUNTER — Ambulatory Visit: Payer: Self-pay | Admitting: Internal Medicine

## 2016-01-16 ENCOUNTER — Other Ambulatory Visit: Payer: Self-pay | Admitting: *Deleted

## 2016-01-16 ENCOUNTER — Ambulatory Visit: Payer: Self-pay | Admitting: *Deleted

## 2016-01-16 DIAGNOSIS — B20 Human immunodeficiency virus [HIV] disease: Secondary | ICD-10-CM

## 2016-01-16 DIAGNOSIS — F32A Depression, unspecified: Secondary | ICD-10-CM

## 2016-01-16 DIAGNOSIS — F329 Major depressive disorder, single episode, unspecified: Secondary | ICD-10-CM

## 2016-01-16 MED ORDER — ELVITEG-COBIC-EMTRICIT-TENOFAF 150-150-200-10 MG PO TABS: ORAL_TABLET | ORAL | Status: DC

## 2016-01-16 NOTE — BH Specialist Note (Signed)
Counselor met with Henry [name] in the [facility] office as a referral.  Patient was oriented times four and well groomed.  Patient was alert and talkative.

## 2016-01-18 ENCOUNTER — Ambulatory Visit: Payer: Self-pay | Admitting: *Deleted

## 2016-01-23 ENCOUNTER — Ambulatory Visit: Payer: Self-pay | Admitting: *Deleted

## 2016-02-15 ENCOUNTER — Encounter: Payer: Self-pay | Admitting: Internal Medicine

## 2016-02-16 ENCOUNTER — Other Ambulatory Visit: Payer: Self-pay

## 2016-03-01 ENCOUNTER — Encounter: Payer: Self-pay | Admitting: Internal Medicine

## 2016-03-01 ENCOUNTER — Ambulatory Visit (INDEPENDENT_AMBULATORY_CARE_PROVIDER_SITE_OTHER): Payer: Self-pay | Admitting: Internal Medicine

## 2016-03-01 VITALS — BP 150/91 | HR 81 | Temp 98.2°F | Ht 73.0 in | Wt 186.0 lb

## 2016-03-01 DIAGNOSIS — Z113 Encounter for screening for infections with a predominantly sexual mode of transmission: Secondary | ICD-10-CM

## 2016-03-01 DIAGNOSIS — B20 Human immunodeficiency virus [HIV] disease: Secondary | ICD-10-CM

## 2016-03-01 DIAGNOSIS — Z79899 Other long term (current) drug therapy: Secondary | ICD-10-CM

## 2016-03-01 LAB — LIPID PANEL
Cholesterol: 149 mg/dL (ref 125–200)
HDL: 47 mg/dL (ref >=40)
LDL CALC: 89 mg/dL (ref <130)
Total CHOL/HDL Ratio: 3.2 Ratio (ref <=5.0)
Triglycerides: 64 mg/dL (ref <150)
VLDL: 13 mg/dL (ref <30)

## 2016-03-01 LAB — CBC WITH DIFFERENTIAL/PLATELET
BASOS ABS: 31 {cells}/uL (ref 0–200)
Basophils Relative: 1 %
EOS ABS: 31 {cells}/uL (ref 15–500)
Eosinophils Relative: 1 %
HEMATOCRIT: 46.1 % (ref 38.5–50.0)
Hemoglobin: 16.3 g/dL (ref 13.2–17.1)
LYMPHS ABS: 1457 {cells}/uL (ref 850–3900)
Lymphocytes Relative: 47 %
MCH: 32.9 pg (ref 27.0–33.0)
MCHC: 35.4 g/dL (ref 32.0–36.0)
MCV: 93.1 fL (ref 80.0–100.0)
MONO ABS: 217 {cells}/uL (ref 200–950)
MPV: 9.2 fL (ref 7.5–12.5)
Monocytes Relative: 7 %
NEUTROS PCT: 44 %
Neutro Abs: 1364 cells/uL — ABNORMAL LOW (ref 1500–7800)
PLATELETS: 195 10*3/uL (ref 140–400)
RBC: 4.95 MIL/uL (ref 4.20–5.80)
RDW: 13.6 % (ref 11.0–15.0)
WBC: 3.1 10*3/uL — ABNORMAL LOW (ref 3.8–10.8)

## 2016-03-01 LAB — COMPLETE METABOLIC PANEL WITH GFR
ALT: 14 U/L (ref 9–46)
AST: 18 U/L (ref 10–40)
Albumin: 4.4 g/dL (ref 3.6–5.1)
Alkaline Phosphatase: 46 U/L (ref 40–115)
BUN: 10 mg/dL (ref 7–25)
CHLORIDE: 105 mmol/L (ref 98–110)
CO2: 28 mmol/L (ref 20–31)
Calcium: 9.3 mg/dL (ref 8.6–10.3)
Creat: 1.11 mg/dL (ref 0.60–1.35)
GFR, EST NON AFRICAN AMERICAN: 86 mL/min (ref >=60)
GFR, Est African American: >89 mL/min (ref >=60)
GLUCOSE: 62 mg/dL — AB (ref 65–99)
POTASSIUM: 4 mmol/L (ref 3.5–5.3)
SODIUM: 142 mmol/L (ref 135–146)
Total Bilirubin: 0.6 mg/dL (ref 0.2–1.2)
Total Protein: 7 g/dL (ref 6.1–8.1)

## 2016-03-01 NOTE — Progress Notes (Signed)
   Subjective:    Patient ID: Henry [name], male    DOB: 06/12/1981, 35 y.o.   MRN: [id number]  HPI Follow up on HIV.  Here for HIV follow up.   On Genvoya Since March 2016 and now taking daily.  Did miss about 2 doses. He is reducing his smoking.  No new issues, no complaints.  Uses condoms with all sexual activity.  No warts, no drainage.    Review of Systems  Constitutional: Negative for fatigue.  Gastrointestinal: Negative for nausea and diarrhea.  Skin: Negative for rash.  Neurological: Negative for dizziness and light-headedness.       Objective:   Physical Exam  Constitutional: He appears well-developed and well-nourished. No distress.  HENT:  Mouth/Throat: No oropharyngeal exudate.  Eyes: No scleral icterus.  Cardiovascular: Normal rate, regular rhythm and normal heart sounds.   No murmur heard. Lymphadenopathy:    He has no cervical adenopathy.  Skin: No rash noted.          Assessment & Plan:

## 2016-03-01 NOTE — Assessment & Plan Note (Signed)
Doing good.  Labs today and rtc 6 months unless concerns.

## 2016-03-02 LAB — T-HELPER CELL (CD4) - (RCID CLINIC ONLY)
CD4 % Helper T Cell: 35 % (ref 33–55)
CD4 T CELL ABS: 520 /uL (ref 400–2700)

## 2016-03-02 LAB — HIV-1 RNA QUANT-NO REFLEX-BLD

## 2016-03-02 LAB — RPR

## 2016-03-05 LAB — URINE CYTOLOGY ANCILLARY ONLY
Chlamydia: NEGATIVE
Neisseria Gonorrhea: NEGATIVE

## 2016-05-08 ENCOUNTER — Telehealth: Payer: Self-pay | Admitting: *Deleted

## 2016-05-08 NOTE — Telephone Encounter (Signed)
Patient incarcerated, jail is requesting his medical records for continuity of care.  Faxed notes from Jan 2017 to current to [phone] Next appointment scheduled for December 2017 - will not cancel at this time. He may be released by then. [name], Akaisha Truman M, RN

## 2016-07-24 ENCOUNTER — Telehealth: Payer: Self-pay

## 2016-07-24 NOTE — Telephone Encounter (Signed)
Voicemail from 07-23-16  Patient was advised in [place] and his lip was bursted. It is now swollen and painful.  Patient thinks he needs antibiotics and pain medication.   Return call : left message on friends cell phone to call the office.    [phone] friends phone   Annsley Akkerman Gorden HarmsK Balian Schaller, RN

## 2016-07-30 NOTE — Telephone Encounter (Signed)
He will need to be seen in urgent care.  No indication for either.

## 2016-07-30 NOTE — Telephone Encounter (Signed)
Called patient and no answer and no voice mail. If patient calls back will relay this information to him. Henry [name]

## 2016-08-16 ENCOUNTER — Ambulatory Visit: Payer: Self-pay | Admitting: Internal Medicine

## 2016-09-05 ENCOUNTER — Other Ambulatory Visit: Payer: Self-pay | Admitting: *Deleted

## 2016-09-05 DIAGNOSIS — B20 Human immunodeficiency virus [HIV] disease: Secondary | ICD-10-CM

## 2016-09-05 MED ORDER — ELVITEG-COBIC-EMTRICIT-TENOFAF 150-150-200-10 MG PO TABS: ORAL_TABLET | ORAL | 5 refills | Status: DC

## 2017-01-21 ENCOUNTER — Other Ambulatory Visit (INDEPENDENT_AMBULATORY_CARE_PROVIDER_SITE_OTHER): Payer: Self-pay

## 2017-01-21 ENCOUNTER — Ambulatory Visit: Payer: Self-pay | Admitting: Licensed Clinical Social Worker

## 2017-01-21 ENCOUNTER — Ambulatory Visit (INDEPENDENT_AMBULATORY_CARE_PROVIDER_SITE_OTHER): Payer: Self-pay | Admitting: Licensed Clinical Social Worker

## 2017-01-21 DIAGNOSIS — F1420 Cocaine dependence, uncomplicated: Secondary | ICD-10-CM

## 2017-01-21 DIAGNOSIS — B20 Human immunodeficiency virus [HIV] disease: Secondary | ICD-10-CM

## 2017-01-22 LAB — T-HELPER CELL (CD4) - (RCID CLINIC ONLY)
CD4 % Helper T Cell: 24 % — ABNORMAL LOW (ref 33–55)
CD4 T Cell Abs: 320 /uL — ABNORMAL LOW (ref 400–2700)

## 2017-01-23 LAB — HIV-1 RNA QUANT-NO REFLEX-BLD
HIV 1 RNA Quant: 178000 copies/mL — ABNORMAL HIGH
HIV-1 RNA QUANT, LOG: 5.25 {Log_copies}/mL — AB

## 2017-01-31 ENCOUNTER — Ambulatory Visit: Payer: Self-pay | Admitting: Internal Medicine

## 2017-02-12 NOTE — Progress Notes (Signed)
See duplicate note for 01/21/17.  Henry AlbertsSherry Horace [name], [facility]

## 2017-02-12 NOTE — Progress Notes (Signed)
Name: Henry [name]  MRN: [id number]   Date: 01/21/17  Time started: 2:11 pm    Time ended: 3:11 pm  Behavior: Tearful; Friendly and cooperative Mood: Guilty and Anxious Affect: Appropriate for topic and within range Thought Process: Normal and coherent Thought Content: Logical Thought Disorder and Perceptual Anomalies: None observed or self-reported S/I and H/I: None observed Dx: F14.20 (304.20) Cocaine Use Disorder, Severe  Referral Source: Amy at [facility], Dr. Henreitta Leber patient  Content of Session: Met with patient based on referral from [place], due to patient's substance use.  Intervention Provided: Performed substance use assessment.  Provided intake appointment for admission into Level 3 residential treatment facility.  Educated patient on the [facility] 8 Domains of Wellness.  Provided hope for sobriety with treatment.  Response: Patient reported issues with living situation, relationships, and employment.  Patient reported abusing Crack Cocaine daily, which was impacting his ability to go to work daily, have income, and not divert his income for drugs.  Patient explained that his lack of income also impacts his housing situation, which is currently dangerous to recovery and abstinence. 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  Patient presented as knowing that his use is problematic and was motivated to enter RTF as early as next week.  Patient presented as excited about the  opportunity to leave his dangerous recovery environment and receive treatment and was excited to share the news with his mother.  Patient denied needing bus passes to attend intake appointment and stated that his mother would drive him to the appointment.  Patient was receptive to learning about and discussing the domains of wellness, their connection, and how to fulfill each.  Patient presented as hopeful that he can obtain abstinence and lead a fulfilling life.  Plan: Patient has intake appointment for admission with [facility] for Wednesday May 30th at 7:45 am at [address]  Patient will complete the program successfully and receive discharge planning and referral to lower level of addiction treatment.  Sande Rives, [facility]

## 2017-04-22 ENCOUNTER — Other Ambulatory Visit: Payer: Self-pay

## 2017-04-22 DIAGNOSIS — B20 Human immunodeficiency virus [HIV] disease: Secondary | ICD-10-CM

## 2017-04-25 ENCOUNTER — Other Ambulatory Visit: Payer: Self-pay | Admitting: Internal Medicine

## 2017-04-25 ENCOUNTER — Other Ambulatory Visit: Payer: Self-pay

## 2017-04-25 DIAGNOSIS — B20 Human immunodeficiency virus [HIV] disease: Secondary | ICD-10-CM

## 2017-04-25 LAB — CBC WITH DIFFERENTIAL/PLATELET
BASOS PCT: 1 %
Basophils Absolute: 33 cells/uL (ref 0–200)
EOS ABS: 66 {cells}/uL (ref 15–500)
Eosinophils Relative: 2 %
HCT: 39.2 % (ref 38.5–50.0)
Hemoglobin: 13.6 g/dL (ref 13.2–17.1)
Lymphocytes Relative: 43 %
Lymphs Abs: 1419 cells/uL (ref 850–3900)
MCH: 31.9 pg (ref 27.0–33.0)
MCHC: 34.7 g/dL (ref 32.0–36.0)
MCV: 91.8 fL (ref 80.0–100.0)
MPV: 9.5 fL (ref 7.5–12.5)
Monocytes Absolute: 264 cells/uL (ref 200–950)
Monocytes Relative: 8 %
Neutro Abs: 1518 cells/uL (ref 1500–7800)
Neutrophils Relative %: 46 %
Platelets: 164 10*3/uL (ref 140–400)
RBC: 4.27 MIL/uL (ref 4.20–5.80)
RDW: 13.1 % (ref 11.0–15.0)
WBC: 3.3 10*3/uL — ABNORMAL LOW (ref 3.8–10.8)

## 2017-04-26 LAB — T-HELPER CELL (CD4) - (RCID CLINIC ONLY)
CD4 T CELL HELPER: 45 % (ref 33–55)
CD4 T Cell Abs: 440 /uL (ref 400–2700)

## 2017-04-26 LAB — COMPREHENSIVE METABOLIC PANEL
ALT: 42 U/L (ref 9–46)
AST: 86 U/L — ABNORMAL HIGH (ref 10–40)
Albumin: 4.1 g/dL (ref 3.6–5.1)
Alkaline Phosphatase: 44 U/L (ref 40–115)
BUN: 13 mg/dL (ref 7–25)
CO2: 24 mmol/L (ref 20–32)
Calcium: 9.1 mg/dL (ref 8.6–10.3)
Chloride: 106 mmol/L (ref 98–110)
Creat: 1.08 mg/dL (ref 0.60–1.35)
Glucose, Bld: 96 mg/dL (ref 65–99)
Potassium: 4.1 mmol/L (ref 3.5–5.3)
SODIUM: 140 mmol/L (ref 135–146)
Total Bilirubin: 0.4 mg/dL (ref 0.2–1.2)
Total Protein: 7.1 g/dL (ref 6.1–8.1)

## 2017-04-26 LAB — LIPID PANEL
Cholesterol: 121 mg/dL (ref <200)
HDL: 54 mg/dL (ref >40)
LDL Cholesterol: 50 mg/dL (ref <100)
Total CHOL/HDL Ratio: 2.2 Ratio (ref <5.0)
Triglycerides: 87 mg/dL (ref <150)
VLDL: 17 mg/dL (ref <30)

## 2017-04-28 LAB — HIV-1 RNA,QN PCR W/REFLEX GENOTYPE
HIV-1 RNA, QN PCR: 4.99 Log cps/mL — ABNORMAL HIGH
HIV-1 RNA, QN PCR: 96700 Copies/mL — ABNORMAL HIGH

## 2017-05-02 ENCOUNTER — Encounter: Payer: Self-pay | Admitting: Internal Medicine

## 2017-05-07 ENCOUNTER — Ambulatory Visit (INDEPENDENT_AMBULATORY_CARE_PROVIDER_SITE_OTHER): Payer: No Typology Code available for payment source | Admitting: Licensed Clinical Social Worker

## 2017-05-07 ENCOUNTER — Encounter: Payer: Self-pay | Admitting: Internal Medicine

## 2017-05-07 ENCOUNTER — Ambulatory Visit (INDEPENDENT_AMBULATORY_CARE_PROVIDER_SITE_OTHER): Payer: Self-pay | Admitting: Internal Medicine

## 2017-05-07 VITALS — BP 154/96 | HR 90 | Temp 97.9°F | Ht 73.0 in | Wt 165.0 lb

## 2017-05-07 DIAGNOSIS — B20 Human immunodeficiency virus [HIV] disease: Secondary | ICD-10-CM

## 2017-05-07 DIAGNOSIS — F17200 Nicotine dependence, unspecified, uncomplicated: Secondary | ICD-10-CM

## 2017-05-07 DIAGNOSIS — Z23 Encounter for immunization: Secondary | ICD-10-CM

## 2017-05-07 DIAGNOSIS — F331 Major depressive disorder, recurrent, moderate: Secondary | ICD-10-CM

## 2017-05-07 DIAGNOSIS — F1420 Cocaine dependence, uncomplicated: Secondary | ICD-10-CM

## 2017-05-07 DIAGNOSIS — F1410 Cocaine abuse, uncomplicated: Secondary | ICD-10-CM

## 2017-05-07 MED ORDER — BICTEGRAVIR-EMTRICITAB-TENOFOV 50-200-25 MG PO TABS: 1.0000 | ORAL_TABLET | Freq: Every day | ORAL | 11 refills | Status: DC

## 2017-05-07 NOTE — Assessment & Plan Note (Signed)
Struggles with use.  Will continue with our counselor

## 2017-05-07 NOTE — Progress Notes (Signed)
   Subjective:    Patient ID: Henry [name], male    DOB: 07/04/1981, 36 y.o.   MRN: [id number]  HPI Here for follow up of HIV Has not seen a provider in over 1 year.  Recent viral load up to 178,000.  CD4 440.  Previously was on [facility].  Interested in getting back into care.  Did see Cordelia PenSherry in May.  Struggling with substance abuse, depression.  Tells me he is up to date with the ADAP paperwork.  CD4 440, viral load 96,700.  No weight loss, no diarrhea.    Review of Systems  Constitutional: Negative for activity change and appetite change.  Gastrointestinal: Negative for diarrhea and nausea.  Skin: Negative for rash.  Neurological: Negative for dizziness.       Objective:   Physical Exam  Constitutional: He appears well-developed and well-nourished. No distress.  HENT:  Mouth/Throat: No oropharyngeal exudate.  Eyes: No scleral icterus.  Cardiovascular: Normal rate, regular rhythm and normal heart sounds.   No murmur heard. Pulmonary/Chest: Effort normal and breath sounds normal. No respiratory distress.  Lymphadenopathy:    He has no cervical adenopathy.  Skin: No rash noted.   SH: some cocaine use       Assessment & Plan:

## 2017-05-07 NOTE — Assessment & Plan Note (Signed)
Spoke with our counselor today and will schedule with her

## 2017-05-07 NOTE — Assessment & Plan Note (Signed)
Will get him back on treatment with [facility].  Will start today and labs in 3 weeks and with me in 4 weeks

## 2017-05-10 NOTE — BH Specialist Note (Signed)
Integrated Behavioral Health Follow Up Visit  MRN: [id number] Name: Henry [name]   Session Start time: 2:30 pm Session End time: 2:46 pm Total time: 16 mins Number of Integrated Behavioral Health Clinician visits: 3/10  Type of Service: Integrated Behavioral Health- Individual/Family Interpretor:No. Interpretor Name and Language: N/A   Warm Hand Off Completed.       SUBJECTIVE: Henry [name] is a 36 y.o. male accompanied by patient. Patient was referred by Dr. Luciana Axeomer for substance use and referral to treatment.  Patient reports the following symptoms/concerns: [facility] but was not admitted because he did not have 30 days worth of medication.  Patient reported continuation of Crack Cocaine use, using everyday, with last use today.  Patient reported that he is motivated to enter treatment at this time and [facility].  Patient denied that he was dismissed or unsuccessfully discharged from their program and reported that he was informed to come back after he had 30 days of medication.  However patient reported that he cannot enter treatment today or tomorrow and he needs to wait 4-5 days to get some "affairs in order".  Patient denied experiencing any safety issues and promised to keep himself safe until he is ready to enter treatment.  The patient promised to engage in safe sex and to monitor his safety in various environments.  [facility] and was informed of available appointment for tomorrow.  Patient turned down the appointment due to court date, was provided with [facility]'s number, and was instructed to call them directly when he is ready to enter treatment.  Patient was receptive to and in agreement with this.  [facility] provided [facility] with bus passes, food from [facility] pant[facility], and gave little green and little blue books  for location of hot meals and food pantries in [place].  Severity of problem: severe  OBJECTIVE: Mood: Anxious and Affect: Appropriate Risk of harm to self or others: No plan to harm self or others  ASSESSMENT: Patient is currently experiencing substance use disorder and may benefit from assessment and treatment.  Patient presents as motivated to attend treatment and presents in the preparation stage of change.   GOALS ADDRESSED: Patient will reduce symptoms of: substance use and increase knowledge and/or ability of: coping skills, healthy habits and stress reduction and also: Increase healthy adjustment to current life circumstances, Increase adequate support systems for patient/family and Decrease self-medicating behaviors  INTERVENTIONS: Motivational Interviewing and Link to [facility]  PLAN: 1. Behavioral recommendations: Patient will "get his affairs in order" and call [facility] to schedule intake appointment and enter treatment 2. Referral(s): Substance Abuse Program 3. "From scale of 1-10, how likely are you to follow plan?": 7  Vergia AlbertsSherry Brenan [place], [facility]

## 2017-06-05 LAB — HIV-1 GENOTYPE: HIV-1 Genotype: DETECTED — AB

## 2017-12-09 ENCOUNTER — Emergency Department (HOSPITAL_COMMUNITY): Payer: Self-pay

## 2017-12-09 ENCOUNTER — Emergency Department (HOSPITAL_COMMUNITY)
Admission: EM | Admit: 2017-12-09 | Discharge: 2017-12-09 | Disposition: A | Discharge status: Home / Self Care | Payer: Self-pay | Attending: Emergency Medicine | Admitting: Emergency Medicine

## 2017-12-09 ENCOUNTER — Encounter (HOSPITAL_COMMUNITY): Payer: Self-pay | Admitting: *Deleted

## 2017-12-09 DIAGNOSIS — Z21 Asymptomatic human immunodeficiency virus [HIV] infection status: Secondary | ICD-10-CM | POA: Insufficient documentation

## 2017-12-09 DIAGNOSIS — Y929 Unspecified place or not applicable: Secondary | ICD-10-CM | POA: Insufficient documentation

## 2017-12-09 DIAGNOSIS — Y999 Unspecified external cause status: Secondary | ICD-10-CM | POA: Insufficient documentation

## 2017-12-09 DIAGNOSIS — Z23 Encounter for immunization: Secondary | ICD-10-CM | POA: Insufficient documentation

## 2017-12-09 DIAGNOSIS — Y9389 Activity, other specified: Secondary | ICD-10-CM | POA: Insufficient documentation

## 2017-12-09 DIAGNOSIS — S0181XA Laceration without foreign body of other part of head, initial encounter: Secondary | ICD-10-CM

## 2017-12-09 DIAGNOSIS — Z79899 Other long term (current) drug therapy: Secondary | ICD-10-CM | POA: Insufficient documentation

## 2017-12-09 DIAGNOSIS — F17210 Nicotine dependence, cigarettes, uncomplicated: Secondary | ICD-10-CM | POA: Insufficient documentation

## 2017-12-09 DIAGNOSIS — Z9114 Patient's other noncompliance with medication regimen: Secondary | ICD-10-CM | POA: Insufficient documentation

## 2017-12-09 MED ORDER — HYDROCODONE-ACETAMINOPHEN 5-325 MG PO TABS
1.0000 | ORAL_TABLET | Freq: Once | ORAL | Status: AC
  Administered 2017-12-09: 1 via ORAL
  Filled 2017-12-09: qty 1

## 2017-12-09 MED ORDER — IBUPROFEN 200 MG PO TABS
600.0000 mg | ORAL_TABLET | Freq: Once | ORAL | Status: AC
  Administered 2017-12-09: 600 mg via ORAL
  Filled 2017-12-09: qty 3

## 2017-12-09 MED ORDER — TETANUS-DIPHTH-ACELL PERTUSSIS 5-2.5-18.5 LF-MCG/0.5 IM SUSP
0.5000 mL | Freq: Once | INTRAMUSCULAR | Status: AC
  Administered 2017-12-09: 0.5 mL via INTRAMUSCULAR
  Filled 2017-12-09: qty 0.5

## 2017-12-09 MED ORDER — BUPIVACAINE-EPINEPHRINE (PF) 0.5% -1:200000 IJ SOLN
10.0000 mL | Freq: Once | INTRAMUSCULAR | Status: AC
  Administered 2017-12-09: 10 mL
  Filled 2017-12-09: qty 30

## 2017-12-09 NOTE — Discharge Instructions (Signed)
Your wound has been closed with a medical-grade glue called Dermabond. Please adhere to the following wound care instructions: ° °You may shower, but avoid submerging the wound. Do not scrub the wound, as this may cause the glue to wear off prematurely.   ° °The glue will wear off on its own, usually within 5-10 days. During this time period DO NOT apply antibiotic ointments or any other ointments or lotions to the area as this can cause the glue to wear off prematurely. ° °After 10 days, you may apply ointments, such as Neosporin, to the area to help the remaining glue to wear off.  ° °After the wound has healed and the glue is gone, you may apply ointments such as Aquaphor to the wound to reduce scarring. ° °May use ibuprofen, naproxen, or Tylenol for pain. ° °Return to the ED should the wound edges come apart or signs of infection arise, such as spreading redness, puffiness/swelling, pus draining from the wound, severe increase in pain, or any other major issues. °
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
## 2018-01-24 ENCOUNTER — Other Ambulatory Visit: Payer: Self-pay

## 2018-01-24 ENCOUNTER — Encounter (HOSPITAL_COMMUNITY): Payer: Self-pay

## 2018-01-24 ENCOUNTER — Emergency Department (HOSPITAL_COMMUNITY)
Admission: EM | Admit: 2018-01-24 | Discharge: 2018-01-24 | Disposition: A | Discharge status: Home / Self Care | Payer: Self-pay | Attending: Emergency Medicine | Admitting: Emergency Medicine

## 2018-01-24 DIAGNOSIS — Y929 Unspecified place or not applicable: Secondary | ICD-10-CM | POA: Insufficient documentation

## 2018-01-24 DIAGNOSIS — Y939 Activity, unspecified: Secondary | ICD-10-CM | POA: Insufficient documentation

## 2018-01-24 DIAGNOSIS — W228XXA Striking against or struck by other objects, initial encounter: Secondary | ICD-10-CM | POA: Insufficient documentation

## 2018-01-24 DIAGNOSIS — Z79899 Other long term (current) drug therapy: Secondary | ICD-10-CM | POA: Insufficient documentation

## 2018-01-24 DIAGNOSIS — B20 Human immunodeficiency virus [HIV] disease: Secondary | ICD-10-CM | POA: Insufficient documentation

## 2018-01-24 DIAGNOSIS — F17210 Nicotine dependence, cigarettes, uncomplicated: Secondary | ICD-10-CM | POA: Insufficient documentation

## 2018-01-24 DIAGNOSIS — Y999 Unspecified external cause status: Secondary | ICD-10-CM | POA: Insufficient documentation

## 2018-01-24 DIAGNOSIS — S81011A Laceration without foreign body, right knee, initial encounter: Secondary | ICD-10-CM | POA: Insufficient documentation

## 2018-01-24 MED ORDER — LIDOCAINE HCL (PF) 1 % IJ SOLN
30.0000 mL | Freq: Once | INTRAMUSCULAR | Status: DC
  Filled 2018-01-24: qty 30

## 2018-01-24 MED ORDER — BACITRACIN ZINC 500 UNIT/GM EX OINT
TOPICAL_OINTMENT | CUTANEOUS | Status: AC
  Filled 2018-01-24: qty 0.9

## 2018-01-24 NOTE — Discharge Instructions (Signed)
Follow-up with your primary care doctor in 1 week for suture/staple removal. Return to the ED for new or worsening symptoms.
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
## 2018-01-24 NOTE — ED Notes (Signed)
Patient took discharge instructions but refused to sign

## 2018-02-03 ENCOUNTER — Emergency Department (HOSPITAL_COMMUNITY)
Admission: EM | Admit: 2018-02-03 | Discharge: 2018-02-03 | Disposition: A | Discharge status: Home / Self Care | Payer: Self-pay | Attending: Emergency Medicine | Admitting: Emergency Medicine

## 2018-02-03 ENCOUNTER — Encounter (HOSPITAL_COMMUNITY): Payer: Self-pay | Admitting: Emergency Medicine

## 2018-02-03 ENCOUNTER — Other Ambulatory Visit: Payer: Self-pay

## 2018-02-03 DIAGNOSIS — S81011D Laceration without foreign body, right knee, subsequent encounter: Secondary | ICD-10-CM | POA: Insufficient documentation

## 2018-02-03 DIAGNOSIS — B20 Human immunodeficiency virus [HIV] disease: Secondary | ICD-10-CM

## 2018-02-03 DIAGNOSIS — X58XXXD Exposure to other specified factors, subsequent encounter: Secondary | ICD-10-CM | POA: Insufficient documentation

## 2018-02-03 NOTE — Addendum Note (Signed)
Addended byJimmy Picket: ABBITT, KATRINA F on: 02/03/2018 08:14 AM   Modules accepted: Orders

## 2018-02-03 NOTE — ED Triage Notes (Signed)
Patient to ED for staple removal in R knee. No signs of infection noted.

## 2018-02-04 LAB — COMPLETE METABOLIC PANEL WITH GFR
AG Ratio: 1 (calc) (ref 1.0–2.5)
ALBUMIN MSPROF: 3.9 g/dL (ref 3.6–5.1)
ALT: 24 U/L (ref 9–46)
AST: 39 U/L (ref 10–40)
Alkaline phosphatase (APISO): 64 U/L (ref 40–115)
BUN: 15 mg/dL (ref 7–25)
CALCIUM: 9.3 mg/dL (ref 8.6–10.3)
CO2: 28 mmol/L (ref 20–32)
CREATININE: 1.27 mg/dL (ref 0.60–1.35)
Chloride: 104 mmol/L (ref 98–110)
GFR, EST AFRICAN AMERICAN: 83 mL/min/{1.73_m2} (ref >=60)
GFR, Est Non African American: 72 mL/min/{1.73_m2} (ref >=60)
GLOBULIN: 4 g/dL — AB (ref 1.9–3.7)
Glucose, Bld: 118 mg/dL — ABNORMAL HIGH (ref 65–99)
Potassium: 4.2 mmol/L (ref 3.5–5.3)
SODIUM: 138 mmol/L (ref 135–146)
TOTAL PROTEIN: 7.9 g/dL (ref 6.1–8.1)
Total Bilirubin: 0.3 mg/dL (ref 0.2–1.2)

## 2018-02-04 LAB — CBC WITH DIFFERENTIAL/PLATELET
BASOS ABS: 20 {cells}/uL (ref 0–200)
BASOS PCT: 0.6 %
Eosinophils Absolute: 78 cells/uL (ref 15–500)
Eosinophils Relative: 2.3 %
HCT: 43.4 % (ref 38.5–50.0)
HEMOGLOBIN: 15.2 g/dL (ref 13.2–17.1)
Lymphs Abs: 1445 cells/uL (ref 850–3900)
MCH: 32.1 pg (ref 27.0–33.0)
MCHC: 35 g/dL (ref 32.0–36.0)
MCV: 91.8 fL (ref 80.0–100.0)
MPV: 9.1 fL (ref 7.5–12.5)
Monocytes Relative: 8.8 %
Neutro Abs: 1557 cells/uL (ref 1500–7800)
Neutrophils Relative %: 45.8 %
Platelets: 181 10*3/uL (ref 140–400)
RBC: 4.73 10*6/uL (ref 4.20–5.80)
RDW: 12.7 % (ref 11.0–15.0)
TOTAL LYMPHOCYTE: 42.5 %
WBC mixed population: 299 cells/uL (ref 200–950)
WBC: 3.4 10*3/uL — ABNORMAL LOW (ref 3.8–10.8)

## 2018-02-04 LAB — T-HELPER CELL (CD4) - (RCID CLINIC ONLY)
CD4 % Helper T Cell: 28 % — ABNORMAL LOW (ref 33–55)
CD4 T Cell Abs: 430 /uL (ref 400–2700)

## 2018-02-05 LAB — HIV-1 RNA QUANT-NO REFLEX-BLD
HIV 1 RNA QUANT: 170000 {copies}/mL — AB
HIV-1 RNA QUANT, LOG: 5.23 {Log_copies}/mL — AB

## 2018-02-17 ENCOUNTER — Ambulatory Visit (INDEPENDENT_AMBULATORY_CARE_PROVIDER_SITE_OTHER): Payer: Self-pay | Admitting: Internal Medicine

## 2018-02-17 ENCOUNTER — Encounter: Payer: Self-pay | Admitting: Internal Medicine

## 2018-02-17 VITALS — BP 138/88 | HR 80 | Temp 98.4°F | Ht 73.0 in | Wt 178.0 lb

## 2018-02-17 DIAGNOSIS — B20 Human immunodeficiency virus [HIV] disease: Secondary | ICD-10-CM

## 2018-02-17 DIAGNOSIS — F17200 Nicotine dependence, unspecified, uncomplicated: Secondary | ICD-10-CM

## 2018-02-17 DIAGNOSIS — Z113 Encounter for screening for infections with a predominantly sexual mode of transmission: Secondary | ICD-10-CM

## 2018-02-17 DIAGNOSIS — F1410 Cocaine abuse, uncomplicated: Secondary | ICD-10-CM

## 2018-02-17 NOTE — Assessment & Plan Note (Signed)
I offered our counseling services which he declined.  He knows to call if needed.

## 2018-02-17 NOTE — Assessment & Plan Note (Signed)
Trying to smoke less 

## 2018-02-17 NOTE — Assessment & Plan Note (Signed)
Has worsened off of treatment.  Will restart today and recheck in 3 weeks, follow up with me in 4 weeks.   I discussed the need to stay on treatment. He is motivated today

## 2018-02-17 NOTE — Progress Notes (Signed)
   Subjective:    Patient ID: Henry [name], male    DOB: 04/14/1981, 37 y.o.   MRN: [id number]  HPI Here for follow up of HIV I last saw him in September and he had been struggling with cocaine use and poor compliance of his medications.  Since that time he has rid himself of certain people who have had a negative influence in his life and is feeling better.  He is interested in getting back into care now and [facility] and is [facility].  No weight loss, no diarrhea.  Still some drug use but feels it is less of an issue.  Restablishing his relationship with his mother, older sister and trying with his younger sister.     Review of Systems  Constitutional: Negative for fatigue and fever.  Gastrointestinal: Negative for diarrhea.  Skin: Negative for rash.       Objective:   Physical Exam  Constitutional: He appears well-developed and well-nourished.  HENT:  Mouth/Throat: No oropharyngeal exudate.  Eyes: No scleral icterus.  Cardiovascular: Normal rate, regular rhythm and normal heart sounds.  No murmur heard. Pulmonary/Chest: Effort normal and breath sounds normal. No respiratory distress.  Skin: No rash noted.   SH: some drug use but much less and trending down.        Assessment & Plan:

## 2018-02-26 ENCOUNTER — Encounter: Payer: Self-pay | Admitting: Internal Medicine

## 2018-03-17 ENCOUNTER — Ambulatory Visit: Payer: Self-pay | Admitting: Internal Medicine

## 2018-04-21 ENCOUNTER — Ambulatory Visit: Payer: Self-pay | Admitting: Internal Medicine

## 2018-06-17 ENCOUNTER — Telehealth: Payer: Self-pay

## 2018-06-17 ENCOUNTER — Other Ambulatory Visit: Payer: Self-pay | Admitting: Internal Medicine

## 2018-06-17 NOTE — Telephone Encounter (Signed)
Patient is overdue for an appointment with Dr. Luciana Axe and labs. Unable to reach patient with number listed in chart. Patient needs to schedule an appointment with our office. Lorenso Courier, [place]

## 2018-09-05 ENCOUNTER — Encounter (HOSPITAL_COMMUNITY): Payer: Self-pay

## 2018-09-05 ENCOUNTER — Inpatient Hospital Stay (HOSPITAL_COMMUNITY)
Admission: AD | Admit: 2018-09-05 | Discharge: 2018-09-09 | DRG: 885 | Disposition: A | Discharge status: Home / Self Care | Payer: Federal, State, Local not specified - Other | Source: Intra-hospital | Attending: Psychiatry | Admitting: Psychiatry

## 2018-09-05 ENCOUNTER — Encounter (HOSPITAL_COMMUNITY): Payer: Self-pay | Admitting: Emergency Medicine

## 2018-09-05 ENCOUNTER — Emergency Department (HOSPITAL_COMMUNITY)
Admission: EM | Admit: 2018-09-05 | Discharge: 2018-09-05 | Disposition: A | Discharge status: Other Acute Inpatient Hospital | Payer: Self-pay | Attending: Emergency Medicine | Admitting: Emergency Medicine

## 2018-09-05 ENCOUNTER — Emergency Department (HOSPITAL_COMMUNITY): Payer: Self-pay

## 2018-09-05 ENCOUNTER — Other Ambulatory Visit: Payer: Self-pay

## 2018-09-05 DIAGNOSIS — Y939 Activity, unspecified: Secondary | ICD-10-CM | POA: Insufficient documentation

## 2018-09-05 DIAGNOSIS — Z79899 Other long term (current) drug therapy: Secondary | ICD-10-CM | POA: Diagnosis not present

## 2018-09-05 DIAGNOSIS — Z91128 Patient's intentional underdosing of medication regimen for other reason: Secondary | ICD-10-CM | POA: Diagnosis not present

## 2018-09-05 DIAGNOSIS — F1420 Cocaine dependence, uncomplicated: Secondary | ICD-10-CM | POA: Diagnosis present

## 2018-09-05 DIAGNOSIS — B20 Human immunodeficiency virus [HIV] disease: Secondary | ICD-10-CM | POA: Insufficient documentation

## 2018-09-05 DIAGNOSIS — F1010 Alcohol abuse, uncomplicated: Secondary | ICD-10-CM | POA: Diagnosis present

## 2018-09-05 DIAGNOSIS — G4700 Insomnia, unspecified: Secondary | ICD-10-CM | POA: Diagnosis present

## 2018-09-05 DIAGNOSIS — I10 Essential (primary) hypertension: Secondary | ICD-10-CM | POA: Diagnosis present

## 2018-09-05 DIAGNOSIS — F3130 Bipolar disorder, current episode depressed, mild or moderate severity, unspecified: Principal | ICD-10-CM | POA: Diagnosis present

## 2018-09-05 DIAGNOSIS — F419 Anxiety disorder, unspecified: Secondary | ICD-10-CM | POA: Diagnosis present

## 2018-09-05 DIAGNOSIS — Y929 Unspecified place or not applicable: Secondary | ICD-10-CM | POA: Insufficient documentation

## 2018-09-05 DIAGNOSIS — Y999 Unspecified external cause status: Secondary | ICD-10-CM | POA: Insufficient documentation

## 2018-09-05 DIAGNOSIS — R45851 Suicidal ideations: Secondary | ICD-10-CM | POA: Insufficient documentation

## 2018-09-05 DIAGNOSIS — F17210 Nicotine dependence, cigarettes, uncomplicated: Secondary | ICD-10-CM | POA: Insufficient documentation

## 2018-09-05 DIAGNOSIS — T50996A Underdosing of other drugs, medicaments and biological substances, initial encounter: Secondary | ICD-10-CM | POA: Diagnosis present

## 2018-09-05 DIAGNOSIS — S0232XA Fracture of orbital floor, left side, initial encounter for closed fracture: Secondary | ICD-10-CM | POA: Insufficient documentation

## 2018-09-05 DIAGNOSIS — R45850 Homicidal ideations: Secondary | ICD-10-CM | POA: Diagnosis present

## 2018-09-05 DIAGNOSIS — Y900 Blood alcohol level of less than 20 mg/100 ml: Secondary | ICD-10-CM | POA: Diagnosis present

## 2018-09-05 DIAGNOSIS — Z21 Asymptomatic human immunodeficiency virus [HIV] infection status: Secondary | ICD-10-CM | POA: Diagnosis present

## 2018-09-05 DIAGNOSIS — F319 Bipolar disorder, unspecified: Secondary | ICD-10-CM | POA: Diagnosis not present

## 2018-09-05 DIAGNOSIS — S3991XA Unspecified injury of abdomen, initial encounter: Secondary | ICD-10-CM | POA: Insufficient documentation

## 2018-09-05 DIAGNOSIS — S0285XA Fracture of orbit, unspecified, initial encounter for closed fracture: Secondary | ICD-10-CM

## 2018-09-05 HISTORY — DX: Anxiety disorder, unspecified: F41.9

## 2018-09-05 HISTORY — DX: Bipolar disorder, unspecified: F31.9

## 2018-09-05 LAB — COMPREHENSIVE METABOLIC PANEL
ALBUMIN: 3.7 g/dL (ref 3.5–5.0)
ALK PHOS: 74 U/L (ref 38–126)
ALT: 21 U/L (ref 0–44)
AST: 41 U/L (ref 15–41)
Anion gap: 9 (ref 5–15)
BILIRUBIN TOTAL: 0.6 mg/dL (ref 0.3–1.2)
BUN: 5 mg/dL — AB (ref 6–20)
CALCIUM: 9 mg/dL (ref 8.9–10.3)
CO2: 23 mmol/L (ref 22–32)
CREATININE: 0.99 mg/dL (ref 0.61–1.24)
Chloride: 105 mmol/L (ref 98–111)
GFR calc Af Amer: >60 mL/min (ref >60)
GFR calc non Af Amer: >60 mL/min (ref >60)
GLUCOSE: 111 mg/dL — AB (ref 70–99)
Potassium: 4 mmol/L (ref 3.5–5.1)
SODIUM: 137 mmol/L (ref 135–145)
TOTAL PROTEIN: 8.1 g/dL (ref 6.5–8.1)

## 2018-09-05 LAB — SALICYLATE LEVEL: Salicylate Lvl: <7 mg/dL (ref 2.8–30.0)

## 2018-09-05 LAB — CBC
HCT: 47.4 % (ref 39.0–52.0)
Hemoglobin: 16 g/dL (ref 13.0–17.0)
MCH: 32.5 pg (ref 26.0–34.0)
MCHC: 33.8 g/dL (ref 30.0–36.0)
MCV: 96.1 fL (ref 80.0–100.0)
PLATELETS: 213 10*3/uL (ref 150–400)
RBC: 4.93 MIL/uL (ref 4.22–5.81)
RDW: 12.1 % (ref 11.5–15.5)
WBC: 3.9 10*3/uL — ABNORMAL LOW (ref 4.0–10.5)
nRBC: 0 % (ref 0.0–0.2)

## 2018-09-05 LAB — ETHANOL

## 2018-09-05 LAB — RAPID URINE DRUG SCREEN, HOSP PERFORMED
AMPHETAMINES: NOT DETECTED
BARBITURATES: NOT DETECTED
Benzodiazepines: NOT DETECTED
Cocaine: POSITIVE — AB
Opiates: NOT DETECTED
TETRAHYDROCANNABINOL: NOT DETECTED

## 2018-09-05 LAB — ACETAMINOPHEN LEVEL: Acetaminophen (Tylenol), Serum: <10 ug/mL — ABNORMAL LOW (ref 10–30)

## 2018-09-05 MED ORDER — VITAMIN B-1 100 MG PO TABS
100.0000 mg | ORAL_TABLET | Freq: Every day | ORAL | Status: DC
  Administered 2018-09-05 – 2018-09-09 (×5): 100 mg via ORAL
  Filled 2018-09-05 (×6): qty 1

## 2018-09-05 MED ORDER — IOHEXOL 300 MG/ML  SOLN
100.0000 mL | Freq: Once | INTRAMUSCULAR | Status: AC | PRN
  Administered 2018-09-05: 100 mL via INTRAVENOUS

## 2018-09-05 MED ORDER — MAGNESIUM HYDROXIDE 400 MG/5ML PO SUSP: 30.0000 mL | Freq: Every day | ORAL | Status: DC | PRN

## 2018-09-05 MED ORDER — BUSPIRONE HCL 15 MG PO TABS
15.0000 mg | ORAL_TABLET | Freq: Three times a day (TID) | ORAL | Status: DC
  Administered 2018-09-05 – 2018-09-09 (×12): 15 mg via ORAL
  Filled 2018-09-05 (×14): qty 1

## 2018-09-05 MED ORDER — ARIPIPRAZOLE 5 MG PO TABS
5.0000 mg | ORAL_TABLET | Freq: Every day | ORAL | Status: DC
  Administered 2018-09-05: 5 mg via ORAL
  Filled 2018-09-05 (×2): qty 1

## 2018-09-05 MED ORDER — TRAZODONE HCL 50 MG PO TABS: 50.0000 mg | ORAL_TABLET | Freq: Every day | ORAL | Status: DC

## 2018-09-05 MED ORDER — FLUORESCEIN SODIUM 1 MG OP STRP
1.0000 | ORAL_STRIP | Freq: Once | OPHTHALMIC | Status: AC
Start: 2018-09-05 — End: 2018-09-05
  Administered 2018-09-05: 1 via OPHTHALMIC
  Filled 2018-09-05: qty 1

## 2018-09-05 MED ORDER — IBUPROFEN 600 MG PO TABS
600.0000 mg | ORAL_TABLET | Freq: Four times a day (QID) | ORAL | Status: DC | PRN
  Administered 2018-09-05 – 2018-09-09 (×2): 600 mg via ORAL
  Filled 2018-09-05 (×2): qty 1

## 2018-09-05 MED ORDER — ACETAMINOPHEN 325 MG PO TABS
650.0000 mg | ORAL_TABLET | Freq: Four times a day (QID) | ORAL | Status: DC | PRN
  Administered 2018-09-06 – 2018-09-08 (×4): 650 mg via ORAL
  Filled 2018-09-05 (×4): qty 2

## 2018-09-05 MED ORDER — ALUM & MAG HYDROXIDE-SIMETH 200-200-20 MG/5ML PO SUSP: 30.0000 mL | ORAL | Status: DC | PRN

## 2018-09-05 MED ORDER — FOLIC ACID 1 MG PO TABS
1.0000 mg | ORAL_TABLET | Freq: Every day | ORAL | Status: DC
  Administered 2018-09-05 – 2018-09-09 (×5): 1 mg via ORAL
  Filled 2018-09-05 (×6): qty 1

## 2018-09-05 MED ORDER — HYDROXYZINE HCL 25 MG PO TABS
25.0000 mg | ORAL_TABLET | Freq: Three times a day (TID) | ORAL | Status: DC | PRN
  Administered 2018-09-05 – 2018-09-08 (×5): 25 mg via ORAL
  Filled 2018-09-05 (×5): qty 1

## 2018-09-05 MED ORDER — ADULT MULTIVITAMIN W/MINERALS CH
1.0000 | ORAL_TABLET | Freq: Every day | ORAL | Status: DC
  Administered 2018-09-05 – 2018-09-09 (×5): 1 via ORAL
  Filled 2018-09-05 (×7): qty 1

## 2018-09-05 MED ORDER — NICOTINE 21 MG/24HR TD PT24
21.0000 mg | MEDICATED_PATCH | Freq: Every day | TRANSDERMAL | Status: DC
  Administered 2018-09-05 – 2018-09-09 (×5): 21 mg via TRANSDERMAL
  Filled 2018-09-05 (×7): qty 1

## 2018-09-05 MED ORDER — ACETAMINOPHEN 325 MG PO TABS
650.0000 mg | ORAL_TABLET | Freq: Once | ORAL | Status: AC
  Administered 2018-09-05: 650 mg via ORAL
  Filled 2018-09-05: qty 2

## 2018-09-05 MED ORDER — ARIPIPRAZOLE 5 MG PO TABS
5.0000 mg | ORAL_TABLET | Freq: Two times a day (BID) | ORAL | Status: DC
  Filled 2018-09-05 (×2): qty 1

## 2018-09-05 MED ORDER — TRAMADOL HCL 50 MG PO TABS
50.0000 mg | ORAL_TABLET | Freq: Four times a day (QID) | ORAL | Status: DC | PRN
  Administered 2018-09-05 – 2018-09-08 (×4): 50 mg via ORAL
  Filled 2018-09-05 (×4): qty 1

## 2018-09-05 MED ORDER — CHLORDIAZEPOXIDE HCL 25 MG PO CAPS: 25.0000 mg | ORAL_CAPSULE | Freq: Three times a day (TID) | ORAL | Status: DC | PRN

## 2018-09-05 MED ORDER — TETRACAINE HCL 0.5 % OP SOLN
2.0000 [drp] | Freq: Once | OPHTHALMIC | Status: AC
Start: 2018-09-05 — End: 2018-09-05
  Administered 2018-09-05: 2 [drp] via OPHTHALMIC
  Filled 2018-09-05: qty 4

## 2018-09-05 MED ORDER — TRAZODONE HCL 50 MG PO TABS
50.0000 mg | ORAL_TABLET | Freq: Every evening | ORAL | Status: DC | PRN
  Administered 2018-09-05 – 2018-09-08 (×4): 50 mg via ORAL
  Filled 2018-09-05 (×4): qty 1
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
## 2018-09-05 NOTE — Tx Team (Signed)
Initial Treatment Plan 09/05/2018 3:35 PM Andra Motton YNW:[id number]    PATIENT STRESSORS: Medication change or noncompliance Substance abuse   PATIENT STRENGTHS: Ability for insight Average or above average intelligence Capable of independent living Motivation for treatment/growth Supportive family/friends   PATIENT IDENTIFIED PROBLEMS: "I need to follow through"  "Stay clean"  Depression/anxiety  Substance abuse               DISCHARGE CRITERIA:  Adequate post-discharge living arrangements Improved stabilization in mood, thinking, and/or behavior Verbal commitment to aftercare and medication compliance Withdrawal symptoms are absent or subacute and managed without 24-hour nursing intervention  PRELIMINARY DISCHARGE PLAN: Outpatient therapy  PATIENT/FAMILY INVOLVEMENT: This treatment plan has been presented to and reviewed with the patient, Maleak Mcnevin. The patient and family have been given the opportunity to ask questions and make suggestions.  Dewayne Shorter, RN 09/05/2018, 3:35 PM
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
## 2018-09-05 NOTE — ED Notes (Signed)
Voluntary consent form and consent to release information faxed to [facility]

## 2018-09-05 NOTE — ED Notes (Signed)
ENT at bedside
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
## 2018-09-05 NOTE — ED Notes (Signed)
[facility] called for transportation to [facility].  

## 2018-09-05 NOTE — ED Notes (Signed)
Patient transported to CT 
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
## 2018-09-06 DIAGNOSIS — G4700 Insomnia, unspecified: Secondary | ICD-10-CM

## 2018-09-06 DIAGNOSIS — F319 Bipolar disorder, unspecified: Secondary | ICD-10-CM

## 2018-09-06 MED ORDER — LORATADINE 10 MG PO TABS
10.0000 mg | ORAL_TABLET | Freq: Every day | ORAL | Status: DC
  Administered 2018-09-06 – 2018-09-09 (×4): 10 mg via ORAL
  Filled 2018-09-06 (×5): qty 1

## 2018-09-06 MED ORDER — ARIPIPRAZOLE 10 MG PO TABS
10.0000 mg | ORAL_TABLET | Freq: Every day | ORAL | Status: DC
  Administered 2018-09-06 – 2018-09-08 (×3): 10 mg via ORAL
  Filled 2018-09-06 (×4): qty 1
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
## 2018-09-06 NOTE — [facility] Group Notes (Signed)
LCSW Group Therapy Note  09/06/2018    10:00-11:00am   Type of Therapy and Topic:  Group Therapy: Early Messages Received About Anger  Participation Level:  Active   Description of Group:   In this group, patients shared and discussed the early messages received in their lives about anger through parental or other adult modeling, teaching, repression, punishment, violence, and more.  Participants identified how those childhood lessons influence even now how they usually or often react when angered.  The group discussed that anger is a secondary emotion and what may be the underlying emotional themes that come out through anger outbursts or that are ignored through anger suppression.  Finally, as a group there was a conversation about the workbook's quote that "There is nothing wrong with anger; it is just a sign something needs to change."     Therapeutic Goals: 1. Patients will identify one or more childhood message about anger that they received and how it was taught to them. 2. Patients will discuss how these childhood experiences have influenced and continue to influence their own expression or repression of anger even today. 3. Patients will explore possible primary emotions that tend to fuel their secondary emotion of anger. 4. Patients will learn that anger itself is normal and cannot be eliminated, and that healthier coping skills can assist with resolving conflict rather than worsening situations.  Summary of Patient Progress:  The patient shared that his childhood lessons about anger were "yelling, screaming, cursing, throwing things, slamming doors, spitting."  As a result, he tries to refrain from becoming angry but can "go that way" himself with people when they hurt him.  He will say things that "destroy people."  He was very involved in the first part of group then left and did not return.   Therapeutic Modalities:   Cognitive Behavioral Therapy Motivation Interviewing  Lynnell Chad  .
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
## 2018-09-06 NOTE — [facility] Group Notes (Signed)
[facility] Group Notes:  (Nursing)  Date:  09/06/2018  Time: 1:15 Pm Type of Therapy:  Nurse Education  Participation Level:  Active  Participation Quality:  Appropriate  Affect:  Appropriate  Cognitive:  Appropriate  Insight:  Appropriate  Engagement in Group:  Engaged  Modes of Intervention:  Education  Summary of Progress/Problems:Life Skills Group  Shela Nevin 09/06/2018, 8:19 PM

## 2018-09-06 NOTE — Progress Notes (Signed)
D: Pt denies SI/HI/AVH. Pt is pleasant and cooperative. Pt stated he was doing better due to " not being out there" . Pt stated he wanted to get back on track and get away from current environment.  A: Pt was offered support and encouragement. Pt was given scheduled medications. Pt was encourage to attend groups. Q 15 minute checks were done for safety.  R:Pt attends groups and interacts well with peers and staff. Pt is taking medication. Pt has no complaints.Pt receptive to treatment and safety maintained on unit.  Problem: Coping: Goal: Coping ability will improve Outcome: Progressing   Problem: Coping: Goal: Will verbalize feelings Outcome: Progressing   Problem: Health Behavior/Discharge Planning: Goal: Compliance with therapeutic regimen will improve Outcome: Progressing

## 2018-09-06 NOTE — Plan of Care (Signed)
  Problem: Education: Goal: Utilization of techniques to improve thought processes will improve Outcome: Progressing   
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
## 2018-09-07 MED ORDER — ENSURE ENLIVE PO LIQD
237.0000 mL | Freq: Two times a day (BID) | ORAL | Status: DC
  Administered 2018-09-07 – 2018-09-09 (×4): 237 mL via ORAL

## 2018-09-07 NOTE — [facility] Suicide Risk Assessment (Signed)
[facility] INPATIENT:  Family/Significant Other Suicide Prevention Education  Suicide Prevention Education:  Contact Attempts: sister Quamere Lemas [phone], (name of family member/significant other) has been identified by the patient as the family member/significant other with whom the patient will be residing, and identified as the person(s) who will aid the patient in the event of a mental health crisis.  With written consent from the patient, two attempts were made to provide suicide prevention education, prior to and/or following the patient's discharge.  We were unsuccessful in providing suicide prevention education.  A suicide education pamphlet was given to the patient to share with family/significant other.  Date and time of first attempt:  09/07/2018  /  3:00 PM  Date and time of second attempt:  To be done by CSW   Lynnell Chad 09/07/2018, 3:00 PM

## 2018-09-07 NOTE — Plan of Care (Addendum)
Progress note  D: pt found in bed; compliant with medication administration. Pt states he slept fair. Pt rates his depression/hopelessness/anxiety an 88/8/9 out of 10 respectively. Pt states he is suffering from cravings, irritability, and generalized pain that he rates at a 8/10. Pt states his goal for today is to get into inpatient rehab. Pt states he will achieve this by going to groups.  Pt denies any si/hi/ah/vh and verbally agrees to approach staff if these become apparent or before harming himself or others while at [facility]. Pt has been isolative to his room but very pleasant.  A: pt provided support and encouragement. Pt given medication per protocol and standing orders. Q14m safety checks implemented and continued.  R: pt safe on the unit. Will continue to monitor.   Pt progressing in the following metrics  Problem: Education: Goal: Knowledge of the prescribed therapeutic regimen will improve Outcome: Progressing   Problem: Activity: Goal: Interest or engagement in leisure activities will improve Outcome: Progressing Goal: Imbalance in normal sleep/wake cycle will improve Outcome: Progressing   Problem: Health Behavior/Discharge Planning: Goal: Ability to make decisions will improve Outcome: Progressing

## 2018-09-07 NOTE — Progress Notes (Signed)
Patient did not attend the evening speaker AA meeting. Pt was notified that group was beginning but took a shower and remained in his room until group was over.

## 2018-09-07 NOTE — [facility] Group Notes (Signed)
[facility] Group Notes:  (Nursing/MHT/Case Management/Adjunct)  Date:  09/07/2018  Time: 1:15 PM Type of Therapy:  Nurse Education  Participation Level:  Did Not Attend  Henry [name] 09/07/2018, 6:00 PM
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
## 2018-09-07 NOTE — [facility] Group Notes (Signed)
[facility] LCSW Group Therapy Note  09/07/2018  10:00-11:00AM  Type of Therapy and Topic:  Group Therapy:  Adding Supports Including Being Your Own Support  Participation Level:  Active   Description of Group:  Patients in this group were introduced to the concept that additional supports including self-support are an essential part of recovery.  A song entitled "Breaking Down" was played and a group discussion was held in reaction to the idea of needing to add supports.  A song entitled "My Own Hero" was played and a group discussion ensued in which patients stated they could relate to the song and it inspired them to realize they have be willing to help themselves in order to succeed, because other people cannot achieve sobriety or stability for them.  We discussed adding a variety of healthy supports to address the various needs in their lives.    Therapeutic Goals: 1)  demonstrate the importance of being a part of one's own support system 2)  discuss reasons people in one's life may eventually be unable to be continually supportive  3)  identify the patient's current support system and   4)  elicit commitments to add healthy supports and to become more conscious of being self-supportive   Summary of Patient Progress:  The patient expressed that he is willing to add some support groups and/or 12-step groups but also said that he can tell when he enters the room if people are against him, and he will just choose to leave if they are.   Therapeutic Modalities:   Motivational Interviewing Activity  Lynnell Chad

## 2018-09-07 NOTE — Progress Notes (Signed)
D: Pt denies SI/HI , +ve AH-. Pt is pleasant and cooperative. Pt stated he planned to go to LT Tx A: Pt was offered support and encouragement. Pt was given scheduled medications. Pt was encourage to attend groups. Q 15 minute checks were done for safety.  R:Pt attends groups and interacts well with peers and staff. Pt is taking medication. Pt has no complaints.Pt receptive to treatment and safety maintained on unit.  Problem: Coping: Goal: Coping ability will improve 09/07/2018 0027 by Delos Haring, RN Outcome: Progressing 09/07/2018 0025 by Delos Haring, RN Outcome: Progressing   Problem: Coping: Goal: Will verbalize feelings 09/07/2018 0027 by Delos Haring, RN Outcome: Progressing 09/07/2018 0025 by Delos Haring, RN Outcome: Progressing
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
## 2018-09-08 DIAGNOSIS — F419 Anxiety disorder, unspecified: Secondary | ICD-10-CM

## 2018-09-08 MED ORDER — CLONIDINE HCL 0.1 MG PO TABS
0.2000 mg | ORAL_TABLET | Freq: Three times a day (TID) | ORAL | Status: DC | PRN
  Administered 2018-09-08: 0.2 mg via ORAL
  Filled 2018-09-08: qty 2

## 2018-09-08 MED ORDER — METOPROLOL SUCCINATE ER 50 MG PO TB24
100.0000 mg | ORAL_TABLET | Freq: Every day | ORAL | Status: DC
  Administered 2018-09-08 – 2018-09-09 (×2): 100 mg via ORAL
  Filled 2018-09-08 (×3): qty 1

## 2018-09-08 NOTE — Progress Notes (Signed)
Recreation Therapy Notes  Date: 1.6.20 Time: 0930 Location: [address] Dayroom  Group Topic: Stress Management  Goal Area(s) Addresses:  Patient will engage in healthy stress management techniques. Patient will be able to identify positive stress management techniques.  Intervention: Stress Management  Activity : Meditation.  LRT introduced the stress management technique of meditation.  LRT played a meditation that focused on looking at each day as a new beginning.  Patients were to follow along as meditation was played in order to engage in activity.  Education:  Stress Management, Discharge Planning.   Education Outcome: Acknowledges Education  Clinical Observations/Feedback: Pt did not attend group.    Caroll Rancher, LRT/CTRS         Lillia Abed, Camren Lipsett A 09/08/2018 11:00 AM

## 2018-09-08 NOTE — Plan of Care (Signed)
Progress note  D: pt found in the bed; compliant with medication administration. Pt states he slept fair. Pt rates his depression/hopelessness/anxiety an 8/8/8 out of 10 respectively. Pt has complaints of cravings, agitation, irritability, lightheadedness, and pain that he rates at an 8/10. Pt states his goal for today is to feel better and work on a rehab inpatient plan for continued treatment. Pt states he will achieve this by talking to someone who can help him get his goals done. Pt denies any si/hi/ah/vh and verbally agrees to approach staff if these become apparent or before harming himself or others while at [facility]. Pt is still isolative to his room but assertive and pleasant.  A: pt provided support and encouragement. Pt given medication per protocol and standing orders. Q82m safety checks implemented and continued.  R:pt safe on the unit. Will continue to monitor.   Pt progressing in the following metrics  Problem: Safety: Goal: Ability to disclose and discuss suicidal ideas will improve Outcome: Progressing Goal: Ability to identify and utilize support systems that promote safety will improve Outcome: Progressing   Problem: Self-Concept: Goal: Will verbalize positive feelings about self Outcome: Progressing Goal: Level of anxiety will decrease Outcome: Progressing

## 2018-09-08 NOTE — Progress Notes (Addendum)
Pt has screening for possible admission to [facility] on 1/21 and may also walk in either 1/8 or 1/9 for possible admission. Pt has been referred to [facility]--referral in review.   [facility] Authorization Number: [id number] from 09/08/18-09/14/18.  Mj Willis S. Alan Ripper, MSW, LCSW Clinical Social Worker 09/08/2018 12:15 PM

## 2018-09-08 NOTE — Progress Notes (Signed)
D: Patient observed isolative to room initially this evening however was in dayroom for snack after AA. Patient states, "my day has been so-so. I'm realizing my mother is making everything about her, and it's not. It needs to be about me. She's upset that I didn't tell her about things, but when I do, she flips out. My sister is my support, and I will just confide in her, however she is somewhat reactive, too." Patient's affect animated, anxious when speaking of family. Mood depressed and anxious.  Denies pain, physical complaints.   A: Medicated per orders, prn vistaril and trazadone given to promote sleep. Medication education provided. Level III obs in place for safety. Emotional support offered. Patient encouraged to complete Suicide Safety Plan before discharge. Encouraged to attend and participate in unit programming.   R: Patient verbalizes understanding of POC. On reassess, patient is asleep. Patient denies SI/HI/AVH and remains safe on level III obs. Will continue to monitor throughout the night.

## 2018-09-08 NOTE — Progress Notes (Signed)
[facility] MD Progress Note  09/08/2018 9:32 AM Henry [name]  MRN:  [id number] Subjective:    Patient seen in his room he is in bed he tells me he is not sure that he wants to go back to his home fears he will relapse there he acknowledges poor compliance with HIV meds.  He is alert and oriented to person place time situation no cravings tremors or withdrawal but his blood pressures up which may indicate some withdrawal he has been drinking daily he states not to the point of intoxication however he denies wanting to harm self today but can contract here only again Axis II symptomatology seems greater than Axis I at baseline  Principal Problem: Bipolar I disorder, most recent episode depressed (HCC) Diagnosis: Principal Problem:   Bipolar I disorder, most recent episode depressed (HCC)  Total Time spent with patient: 20 minutes Past Medical History:  Past Medical History:  Diagnosis Date  . Anxiety   . Bipolar 1 disorder (HCC)   . HIV (human immunodeficiency virus infection) (HCC)    History reviewed. No pertinent surgical history. Family History:  Family History  Problem Relation Age of Onset  . Thyroid disease Mother   . Heart disease Maternal Grandmother   . Breast cancer Maternal Grandmother    Social History:  Social History   Substance and Sexual Activity  Alcohol Use Yes  . Alcohol/week: 0.0 standard drinks   Comment: 2 a day off and on     Social History   Substance and Sexual Activity  Drug Use Yes  . Types: Marijuana, Cocaine, Amphetamines, Benzodiazepines    Social History   Socioeconomic History  . Marital status: Single    Spouse name: Not on file  . Number of children: Not on file  . Years of education: Not on file  . Highest education level: Not on file  Occupational History  . Not on file  Social Needs  . Financial resource strain: Not on file  . Food insecurity:    Worry: Not on file    Inability: Not on file  . Transportation needs:    Medical: Not on  file    Non-medical: Not on file  Tobacco Use  . Smoking status: Current Every Day Smoker    Packs/day: 1.00    Years: 14.00    Pack years: 14.00    Types: Cigarettes  . Smokeless tobacco: Never Used  . Tobacco comment: trying to cut back  Substance and Sexual Activity  . Alcohol use: Yes    Alcohol/week: 0.0 standard drinks    Comment: 2 a day off and on  . Drug use: Yes    Types: Marijuana, Cocaine, Amphetamines, Benzodiazepines  . Sexual activity: Yes    Partners: Male    Birth control/protection: None, Condom  Lifestyle  . Physical activity:    Days per week: Not on file    Minutes per session: Not on file  . Stress: Not on file  Relationships  . Social connections:    Talks on phone: Not on file    Gets together: Not on file    Attends religious service: Not on file    Active member of club or organization: Not on file    Attends meetings of clubs or organizations: Not on file    Relationship status: Not on file  Other Topics Concern  . Not on file  Social History Narrative  . Not on file   Additional Social History:  Sleep: Fair  Appetite:  Fair  Current Medications: Current Facility-Administered Medications  Medication Dose Route Frequency Provider Last Rate Last Dose  . acetaminophen (TYLENOL) tablet 650 mg  650 mg Oral Q6H PRN Money, Gerlene Burdockravis B, FNP   650 mg at 09/07/18 1700  . alum & mag hydroxide-simeth (MAALOX/MYLANTA) 200-200-20 MG/5ML suspension 30 mL  30 mL Oral Q4H PRN Money, Gerlene Burdockravis B, FNP      . ARIPiprazole (ABILIFY) tablet 10 mg  10 mg Oral QHS Oneta RackLewis, Tanika N, NP   10 mg at 09/07/18 2218  . busPIRone (BUSPAR) tablet 15 mg  15 mg Oral TID Antonieta Pertlary, Greg Lawson, MD   15 mg at 09/08/18 0813  . chlordiazePOXIDE (LIBRIUM) capsule 25 mg  25 mg Oral TID PRN Antonieta Pertlary, Greg Lawson, MD      . feeding supplement (ENSURE ENLIVE) (ENSURE ENLIVE) liquid 237 mL  237 mL Oral BID BM Antonieta Pertlary, Greg Lawson, MD   237 mL at 09/07/18 0955  .  folic acid (FOLVITE) tablet 1 mg  1 mg Oral Daily Antonieta Pertlary, Greg Lawson, MD   1 mg at 09/08/18 0813  . hydrOXYzine (ATARAX/VISTARIL) tablet 25 mg  25 mg Oral TID PRN Money, Gerlene Burdockravis B, FNP   25 mg at 09/07/18 2218  . ibuprofen (ADVIL,MOTRIN) tablet 600 mg  600 mg Oral Q6H PRN Antonieta Pertlary, Greg Lawson, MD   600 mg at 09/05/18 2116  . loratadine (CLARITIN) tablet 10 mg  10 mg Oral Daily Antonieta Pertlary, Greg Lawson, MD   10 mg at 09/08/18 0813  . magnesium hydroxide (MILK OF MAGNESIA) suspension 30 mL  30 mL Oral Daily PRN Money, Feliz Beamravis B, FNP      . multivitamin with minerals tablet 1 tablet  1 tablet Oral Daily Antonieta Pertlary, Greg Lawson, MD   1 tablet at 09/08/18 0813  . nicotine (NICODERM CQ - dosed in mg/24 hours) patch 21 mg  21 mg Transdermal Daily Antonieta Pertlary, Greg Lawson, MD   21 mg at 09/08/18 0813  . thiamine (VITAMIN B-1) tablet 100 mg  100 mg Oral Daily Antonieta Pertlary, Greg Lawson, MD   100 mg at 09/08/18 0813  . traMADol (ULTRAM) tablet 50 mg  50 mg Oral Q6H PRN Antonieta Pertlary, Greg Lawson, MD   50 mg at 09/06/18 2107  . traZODone (DESYREL) tablet 50 mg  50 mg Oral QHS PRN Money, Gerlene Burdockravis B, FNP   50 mg at 09/07/18 2218    Lab Results: No results found for this or any previous visit (from the past 48 hour(s)).  Blood Alcohol level:  Lab Results  Component Value Date   ETH <10 09/05/2018   ETH <5 09/03/2014    Metabolic Disorder Labs: No results found for: HGBA1C, MPG No results found for: PROLACTIN Lab Results  Component Value Date   CHOL 121 04/25/2017   TRIG 87 04/25/2017   HDL 54 04/25/2017   CHOLHDL 2.2 04/25/2017   VLDL 17 04/25/2017   LDLCALC 50 04/25/2017   LDLCALC 89 03/01/2016    Physical Findings: AIMS: Facial and Oral Movements Muscles of Facial Expression: None, normal Lips and Perioral Area: None, normal Jaw: None, normal Tongue: None, normal,Extremity Movements Upper (arms, wrists, hands, fingers): None, normal Lower (legs, knees, ankles, toes): None, normal, Trunk Movements Neck, shoulders, hips:  None, normal, Overall Severity Severity of abnormal movements (highest score from questions above): None, normal Incapacitation due to abnormal movements: None, normal Patient's awareness of abnormal movements (rate only patient's report): No Awareness, Dental Status Current problems with teeth and/or dentures?:  No Does patient usually wear dentures?: No  CIWA:  CIWA-Ar Total: 1 COWS:  COWS Total Score: 1  Musculoskeletal: Strength & Muscle Tone: within normal limits Gait & Station: normal Patient leans: N/A  Psychiatric Specialty Exam: Physical Exam  ROS  Blood pressure (!) 153/99, pulse (!) 101, temperature 98.3 F (36.8 C), resp. rate 16, height 6\' 1"  (1.854 m), weight 80.7 kg, SpO2 98 %.Body mass index is 23.48 kg/m.  General Appearance: Casual  Eye Contact:  Fair  Speech:  Slow  Volume:  Decreased  Mood:  Anxious and Dysphoric  Affect:  Appropriate  Thought Process:  Coherent  Orientation:  Full (Time, Place, and Person)  Thought Content:  Tangential  Suicidal Thoughts:  No  Homicidal Thoughts:  No  Memory:  Immediate;   Fair  Judgement:  Fair  Insight:  Fair and Lacking  Psychomotor Activity:  Normal  Concentration:  Concentration: Fair  Recall:  [employer]Fair  Fund of Knowledge:  Fair  Language:  Negative  Akathisia:  Negative  Handed:  Right  AIMS (if indicated):     Assets:  Communication Skills Desire for Improvement  ADL's:  Intact  Cognition:  WNL  Sleep:  Number of Hours: 6.25     Treatment Plan Summary: Daily contact with patient to assess and evaluate symptoms and progress in treatment, Medication management and Plan For depressive symptoms continue cognitive-based therapy and antidepressant therapy, for chemical dependency issues continue PRN Librium, for hypertension add Toprol and PRN Catapres.  Discussed fully with team  Henry [name],Henry Knezevic, MD 09/08/2018, 9:32 AM

## 2018-09-08 NOTE — Progress Notes (Signed)
Patient attended AA group meeting.  

## 2018-09-08 NOTE — Tx Team (Signed)
Interdisciplinary Treatment and Diagnostic Plan Update  09/08/2018 Time of Session: 1610RU Henry [name] MRN: [id number]  Principal Diagnosis: Bipolar I disorder, most recent episode depressed (HCC)  Secondary Diagnoses: Principal Problem:   Bipolar I disorder, most recent episode depressed (HCC)   Current Medications:  Current Facility-Administered Medications  Medication Dose Route Frequency Provider Last Rate Last Dose  . acetaminophen (TYLENOL) tablet 650 mg  650 mg Oral Q6H PRN Money, Gerlene Burdock, FNP   650 mg at 09/07/18 1700  . alum & mag hydroxide-simeth (MAALOX/MYLANTA) 200-200-20 MG/5ML suspension 30 mL  30 mL Oral Q4H PRN Money, Gerlene Burdock, FNP      . ARIPiprazole (ABILIFY) tablet 10 mg  10 mg Oral QHS Oneta Rack, NP   10 mg at 09/07/18 2218  . busPIRone (BUSPAR) tablet 15 mg  15 mg Oral TID Antonieta Pert, MD   15 mg at 09/08/18 0813  . chlordiazePOXIDE (LIBRIUM) capsule 25 mg  25 mg Oral TID PRN Antonieta Pert, MD      . feeding supplement (ENSURE ENLIVE) (ENSURE ENLIVE) liquid 237 mL  237 mL Oral BID BM Antonieta Pert, MD   237 mL at 09/07/18 0955  . folic acid (FOLVITE) tablet 1 mg  1 mg Oral Daily Antonieta Pert, MD   1 mg at 09/08/18 0813  . hydrOXYzine (ATARAX/VISTARIL) tablet 25 mg  25 mg Oral TID PRN Money, Gerlene Burdock, FNP   25 mg at 09/07/18 2218  . ibuprofen (ADVIL,MOTRIN) tablet 600 mg  600 mg Oral Q6H PRN Antonieta Pert, MD   600 mg at 09/05/18 2116  . loratadine (CLARITIN) tablet 10 mg  10 mg Oral Daily Antonieta Pert, MD   10 mg at 09/08/18 0813  . magnesium hydroxide (MILK OF MAGNESIA) suspension 30 mL  30 mL Oral Daily PRN Money, Feliz Beam B, FNP      . multivitamin with minerals tablet 1 tablet  1 tablet Oral Daily Antonieta Pert, MD   1 tablet at 09/08/18 0813  . nicotine (NICODERM CQ - dosed in mg/24 hours) patch 21 mg  21 mg Transdermal Daily Antonieta Pert, MD   21 mg at 09/08/18 0813  . thiamine (VITAMIN B-1) tablet 100 mg  100 mg  Oral Daily Antonieta Pert, MD   100 mg at 09/08/18 0813  . traMADol (ULTRAM) tablet 50 mg  50 mg Oral Q6H PRN Antonieta Pert, MD   50 mg at 09/06/18 2107  . traZODone (DESYREL) tablet 50 mg  50 mg Oral QHS PRN Money, Gerlene Burdock, FNP   50 mg at 09/07/18 2218   PTA Medications: Medications Prior to Admission  Medication Sig Dispense Refill Last Dose  . acetaminophen (TYLENOL) 500 MG tablet Take 1,000 mg by mouth every 6 (six) hours as needed for mild pain.   09/04/2018 at Unknown time  . ARIPiprazole (ABILIFY) 5 MG tablet Take 1 tablet (5 mg total) by mouth daily. (Patient taking differently: Take 5 mg by mouth 2 (two) times daily. ) 30 tablet 0 Past Week at Unknown time  . BIKTARVY 50-200-25 MG TABS tablet TAKE 1 TABLET BY MOUTH DAILY 30 tablet 0 Past Month at Unknown time  . busPIRone (BUSPAR) 15 MG tablet Take 15 mg by mouth 3 (three) times daily as needed (anxiety).   1 Past Week at Unknown time  . ibuprofen (ADVIL,MOTRIN) 200 MG tablet Take 200 mg by mouth every 6 (six) hours as needed for mild pain.  Past Week at Unknown time  . Multiple Vitamin (MULTIVITAMIN WITH MINERALS) TABS tablet Take 1 tablet by mouth daily.   09/05/2018 at Unknown time  . vitamin C (ASCORBIC ACID) 500 MG tablet Take 500 mg by mouth daily.   09/05/2018 at Unknown time    Patient Stressors: Medication change or noncompliance Substance abuse  Patient Strengths: Ability for insight Average or above average intelligence Capable of independent living Motivation for treatment/growth Supportive family/friends  Treatment Modalities: Medication Management, Group therapy, Case management,  1 to 1 session with clinician, Psychoeducation, Recreational therapy.   Physician Treatment Plan for Primary Diagnosis: Bipolar I disorder, most recent episode depressed (HCC) Long Term Goal(s): Improvement in symptoms so as ready for discharge Improvement in symptoms so as ready for discharge   Short Term Goals: Ability to  identify changes in lifestyle to reduce recurrence of condition will improve Ability to verbalize feelings will improve Ability to disclose and discuss suicidal ideas Ability to demonstrate self-control will improve Ability to identify and develop effective coping behaviors will improve Ability to maintain clinical measurements within normal limits will improve Compliance with prescribed medications will improve Ability to identify triggers associated with substance abuse/mental health issues will improve Ability to identify changes in lifestyle to reduce recurrence of condition will improve Ability to verbalize feelings will improve Ability to disclose and discuss suicidal ideas Ability to demonstrate self-control will improve Ability to identify and develop effective coping behaviors will improve Ability to maintain clinical measurements within normal limits will improve Compliance with prescribed medications will improve Ability to identify triggers associated with substance abuse/mental health issues will improve  Medication Management: Evaluate patient's response, side effects, and tolerance of medication regimen.  Therapeutic Interventions: 1 to 1 sessions, Unit Group sessions and Medication administration.  Evaluation of Outcomes: Progressing  Physician Treatment Plan for Secondary Diagnosis: Principal Problem:   Bipolar I disorder, most recent episode depressed (HCC)  Long Term Goal(s): Improvement in symptoms so as ready for discharge Improvement in symptoms so as ready for discharge   Short Term Goals: Ability to identify changes in lifestyle to reduce recurrence of condition will improve Ability to verbalize feelings will improve Ability to disclose and discuss suicidal ideas Ability to demonstrate self-control will improve Ability to identify and develop effective coping behaviors will improve Ability to maintain clinical measurements within normal limits will  improve Compliance with prescribed medications will improve Ability to identify triggers associated with substance abuse/mental health issues will improve Ability to identify changes in lifestyle to reduce recurrence of condition will improve Ability to verbalize feelings will improve Ability to disclose and discuss suicidal ideas Ability to demonstrate self-control will improve Ability to identify and develop effective coping behaviors will improve Ability to maintain clinical measurements within normal limits will improve Compliance with prescribed medications will improve Ability to identify triggers associated with substance abuse/mental health issues will improve     Medication Management: Evaluate patient's response, side effects, and tolerance of medication regimen.  Therapeutic Interventions: 1 to 1 sessions, Unit Group sessions and Medication administration.  Evaluation of Outcomes: Progressing   RN Treatment Plan for Primary Diagnosis: Bipolar I disorder, most recent episode depressed (HCC) Long Term Goal(s): Knowledge of disease and therapeutic regimen to maintain health will improve  Short Term Goals: Ability to remain free from injury will improve, Ability to disclose and discuss suicidal ideas and Ability to identify and develop effective coping behaviors will improve  Medication Management: RN will administer medications as ordered by provider, will  assess and evaluate patient's response and provide education to patient for prescribed medication. RN will report any adverse and/or side effects to prescribing provider.  Therapeutic Interventions: 1 on 1 counseling sessions, Psychoeducation, Medication administration, Evaluate responses to treatment, Monitor vital signs and CBGs as ordered, Perform/monitor CIWA, COWS, AIMS and Fall Risk screenings as ordered, Perform wound care treatments as ordered.  Evaluation of Outcomes: Progressing   LCSW Treatment Plan for Primary  Diagnosis: Bipolar I disorder, most recent episode depressed (HCC) Long Term Goal(s): Safe transition to appropriate next level of care at discharge, Engage patient in therapeutic group addressing interpersonal concerns.  Short Term Goals: Engage patient in aftercare planning with referrals and resources, Facilitate patient progression through stages of change regarding substance use diagnoses and concerns and Identify triggers associated with mental health/substance abuse issues  Therapeutic Interventions: Assess for all discharge needs, 1 to 1 time with Social worker, Explore available resources and support systems, Assess for adequacy in community support network, Educate family and significant other(s) on suicide prevention, Complete Psychosocial Assessment, Interpersonal group therapy.  Evaluation of Outcomes: Progressing   Progress in Treatment: Attending groups: Yes. Participating in groups: Yes. Taking medication as prescribed: Yes. Toleration medication: Yes. Family/Significant other contact made: Yes, individual(s) contacted:  pt's sister Patient understands diagnosis: Yes. Discussing patient identified problems/goals with staff: Yes. Medical problems stabilized or resolved: Yes. Denies suicidal/homicidal ideation: Yes. Issues/concerns per patient self-inventory: No. Other: n/a   New problem(s) identified: No, Describe:  n/a  New Short Term/Long Term Goal(s): detox, medication management for mood stabilization; elimination of SI thoughts; development of comprehensive mental wellness/sobriety plan.   Patient Goals:  "I want to get into a treatment program and actually follow-through."   Discharge Plan or Barriers: CSW assessing. Pt open to [facility], [facility], [facility] referrals. [facility] pamphlet, Mobile Crisis information, and AA/NA information provided to patient for additional community support and resources.   Reason for Continuation of Hospitalization: Anxiety Depression Medication  stabilization Withdrawal symptoms  Estimated Length of Stay: Wed, 09/10/18  Attendees: Patient: Henry [name] 09/08/2018 9:05 AM  Physician: Dr. Jeannine Kitten MD 09/08/2018 9:05 AM  Nursing: Marlise Eves; Casimiro Needle RN 09/08/2018 9:05 AM  RN Care Manager:x 09/08/2018 9:05 AM  Social Worker: Corrie Mckusick LCSW 09/08/2018 9:05 AM  Recreational Therapist: x 09/08/2018 9:05 AM  Other: Armandina Stammer NP; Marciano Sequin NP 09/08/2018 9:05 AM  Other:  09/08/2018 9:05 AM  Other: 09/08/2018 9:05 AM    Scribe for Treatment Team: Rona Ravens, LCSW 09/08/2018 9:05 AM

## 2018-09-09 MED ORDER — ARIPIPRAZOLE 10 MG PO TABS: 10.0000 mg | ORAL_TABLET | Freq: Every day | ORAL | 2 refills | Status: DC

## 2018-09-09 MED ORDER — BICTEGRAVIR-EMTRICITAB-TENOFOV 50-200-25 MG PO TABS: ORAL_TABLET | ORAL | 0 refills | Status: DC

## 2018-09-09 MED ORDER — METOPROLOL SUCCINATE ER 100 MG PO TB24: 100.0000 mg | ORAL_TABLET | Freq: Every day | ORAL | 11 refills | Status: DC

## 2018-09-09 MED ORDER — BUSPIRONE HCL 15 MG PO TABS: 15.0000 mg | ORAL_TABLET | Freq: Three times a day (TID) | ORAL | 2 refills | Status: DC
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
## 2018-09-09 NOTE — Progress Notes (Signed)
[facility] MD Progress Note  09/09/2018 11:24 AM Henry [name]  MRN:  [id number] Subjective:    Patient seen he has no acute withdrawal he has no thoughts of harming himself at the present time is pleased he came to the hospital to regroup he is been restarted on his HIV meds acknowledges noncompliance with these medications.  He is alert and oriented to person place time situation no thoughts of harming self today and contracting and is eager for some type of rehab.  Discussed with social work Principal Problem: Bipolar I disorder, most recent episode depressed (HCC) Diagnosis: Principal Problem:   Bipolar I disorder, most recent episode depressed (HCC)  Total Time spent with patient: 15 minutes  Past Medical History:  Past Medical History:  Diagnosis Date  . Anxiety   . Bipolar 1 disorder (HCC)   . HIV (human immunodeficiency virus infection) (HCC)    History reviewed. No pertinent surgical history. Family History:  Family History  Problem Relation Age of Onset  . Thyroid disease Mother   . Heart disease Maternal Grandmother   . Breast cancer Maternal Grandmother    Social History:  Social History   Substance and Sexual Activity  Alcohol Use Yes  . Alcohol/week: 0.0 standard drinks   Comment: 2 a day off and on     Social History   Substance and Sexual Activity  Drug Use Yes  . Types: Marijuana, Cocaine, Amphetamines, Benzodiazepines    Social History   Socioeconomic History  . Marital status: Single    Spouse name: Not on file  . Number of children: Not on file  . Years of education: Not on file  . Highest education level: Not on file  Occupational History  . Not on file  Social Needs  . Financial resource strain: Not on file  . Food insecurity:    Worry: Not on file    Inability: Not on file  . Transportation needs:    Medical: Not on file    Non-medical: Not on file  Tobacco Use  . Smoking status: Current Every Day Smoker    Packs/day: 1.00    Years: 14.00   Pack years: 14.00    Types: Cigarettes  . Smokeless tobacco: Never Used  . Tobacco comment: trying to cut back  Substance and Sexual Activity  . Alcohol use: Yes    Alcohol/week: 0.0 standard drinks    Comment: 2 a day off and on  . Drug use: Yes    Types: Marijuana, Cocaine, Amphetamines, Benzodiazepines  . Sexual activity: Yes    Partners: Male    Birth control/protection: None, Condom  Lifestyle  . Physical activity:    Days per week: Not on file    Minutes per session: Not on file  . Stress: Not on file  Relationships  . Social connections:    Talks on phone: Not on file    Gets together: Not on file    Attends religious service: Not on file    Active member of club or organization: Not on file    Attends meetings of clubs or organizations: Not on file    Relationship status: Not on file  Other Topics Concern  . Not on file  Social History Narrative  . Not on file   Additional Social History:                         Sleep: Good  Appetite:  Good  Current Medications: Current  Facility-Administered Medications  Medication Dose Route Frequency Provider Last Rate Last Dose  . acetaminophen (TYLENOL) tablet 650 mg  650 mg Oral Q6H PRN Money, Gerlene Burdockravis B, FNP   650 mg at 09/08/18 1828  . alum & mag hydroxide-simeth (MAALOX/MYLANTA) 200-200-20 MG/5ML suspension 30 mL  30 mL Oral Q4H PRN Money, Gerlene Burdockravis B, FNP      . ARIPiprazole (ABILIFY) tablet 10 mg  10 mg Oral QHS Oneta RackLewis, Tanika N, NP   10 mg at 09/08/18 2143  . busPIRone (BUSPAR) tablet 15 mg  15 mg Oral TID Antonieta Pertlary, Greg Lawson, MD   15 mg at 09/09/18 0825  . chlordiazePOXIDE (LIBRIUM) capsule 25 mg  25 mg Oral TID PRN Antonieta Pertlary, Greg Lawson, MD      . cloNIDine (CATAPRES) tablet 0.2 mg  0.2 mg Oral TID PRN Malvin JohnsFarah, Aalijah Lanphere, MD   0.2 mg at 09/08/18 1156  . feeding supplement (ENSURE ENLIVE) (ENSURE ENLIVE) liquid 237 mL  237 mL Oral BID BM Antonieta Pertlary, Greg Lawson, MD   237 mL at 09/08/18 1828  . folic acid (FOLVITE) tablet 1 mg   1 mg Oral Daily Antonieta Pertlary, Greg Lawson, MD   1 mg at 09/09/18 0825  . hydrOXYzine (ATARAX/VISTARIL) tablet 25 mg  25 mg Oral TID PRN Money, Gerlene Burdockravis B, FNP   25 mg at 09/08/18 2143  . ibuprofen (ADVIL,MOTRIN) tablet 600 mg  600 mg Oral Q6H PRN Antonieta Pertlary, Greg Lawson, MD   600 mg at 09/09/18 0826  . loratadine (CLARITIN) tablet 10 mg  10 mg Oral Daily Antonieta Pertlary, Greg Lawson, MD   10 mg at 09/09/18 0825  . magnesium hydroxide (MILK OF MAGNESIA) suspension 30 mL  30 mL Oral Daily PRN Money, Gerlene Burdockravis B, FNP      . metoprolol succinate (TOPROL-XL) 24 hr tablet 100 mg  100 mg Oral Daily Malvin JohnsFarah, Montrel Donahoe, MD   100 mg at 09/09/18 0824  . multivitamin with minerals tablet 1 tablet  1 tablet Oral Daily Antonieta Pertlary, Greg Lawson, MD   1 tablet at 09/09/18 0825  . nicotine (NICODERM CQ - dosed in mg/24 hours) patch 21 mg  21 mg Transdermal Daily Antonieta Pertlary, Greg Lawson, MD   21 mg at 09/08/18 0813  . thiamine (VITAMIN B-1) tablet 100 mg  100 mg Oral Daily Antonieta Pertlary, Greg Lawson, MD   100 mg at 09/09/18 0824  . traMADol (ULTRAM) tablet 50 mg  50 mg Oral Q6H PRN Antonieta Pertlary, Greg Lawson, MD   50 mg at 09/08/18 2143  . traZODone (DESYREL) tablet 50 mg  50 mg Oral QHS PRN Money, Gerlene Burdockravis B, FNP   50 mg at 09/08/18 2144    Lab Results: No results found for this or any previous visit (from the past 48 hour(s)).  Blood Alcohol level:  Lab Results  Component Value Date   ETH <10 09/05/2018   ETH <5 09/03/2014    Metabolic Disorder Labs: No results found for: HGBA1C, MPG No results found for: PROLACTIN Lab Results  Component Value Date   CHOL 121 04/25/2017   TRIG 87 04/25/2017   HDL 54 04/25/2017   CHOLHDL 2.2 04/25/2017   VLDL 17 04/25/2017   LDLCALC 50 04/25/2017   LDLCALC 89 03/01/2016    Physical Findings: AIMS: Facial and Oral Movements Muscles of Facial Expression: None, normal Lips and Perioral Area: None, normal Jaw: None, normal Tongue: None, normal,Extremity Movements Upper (arms, wrists, hands, fingers): None, normal Lower  (legs, knees, ankles, toes): None, normal, Trunk Movements Neck, shoulders, hips: None, normal, Overall  Severity Severity of abnormal movements (highest score from questions above): None, normal Incapacitation due to abnormal movements: None, normal Patient's awareness of abnormal movements (rate only patient's report): No Awareness, Dental Status Current problems with teeth and/or dentures?: No Does patient usually wear dentures?: No  CIWA:  CIWA-Ar Total: 1 COWS:  COWS Total Score: 1  Musculoskeletal: Strength & Muscle Tone: within normal limits Gait & Station: normal Psychiatric Specialty Exam: Physical Exam  ROS  Blood pressure 139/87, pulse 81, temperature 97.8 F (36.6 C), resp. rate 16, height 6\' 1"  (1.854 m), weight 80.7 kg, SpO2 100 %.Body mass index is 23.48 kg/m.  General Appearance: Casual  Eye Contact:  Good  Speech:  Clear and Coherent  Volume:  Decreased  Mood:  Dysphoric  Affect:  Congruent  Thought Process:  Goal Directed  Orientation:  Full (Time, Place, and Person)  Thought Content:  Logical  Suicidal Thoughts:  No  Homicidal Thoughts:  No  Memory:  Immediate;   Good  Judgement:  Good  Insight:  Good  Psychomotor Activity:  Decreased  Concentration:  Concentration: Good  Recall:  Good  Fund of Knowledge:  Good  Language:  Good  Akathisia:  Negative  Handed:  Right  AIMS (if indicated):     Assets:  Physical Health  ADL's:  Intact  Cognition: Generally intact  Sleep:  Number of Hours: 6.75     Treatment Plan Summary: Daily contact with patient to assess and evaluate symptoms and progress in treatment and Medication management no change in current psychotropic meds probable discharge tomorrow continue to seek rehab options  Ghadeer Kastelic, MD 09/09/2018, 11:24 AM

## 2018-09-09 NOTE — BHH Suicide Risk Assessment (Signed)
[facility] Discharge Suicide Risk Assessment   Principal Problem: Bipolar I disorder, most recent episode depressed (HCC) Discharge Diagnoses: Principal Problem:   Bipolar I disorder, most recent episode depressed (HCC)   Total Time spent with patient: 30 minutes  Current mental status exam-alert oriented to person place situation time without thoughts of self-harm contracting fully Has made plans to stay with sister today and [facility] Mental Status Per Nursing Assessment::   On Admission:  Suicidal ideation indicated by patient, Plan includes specific time, place, or method, Belief that plan would result in death  Demographic Factors:  Male and Henry [name], lesbian, or bisexual orientation  Loss Factors: Decrease in vocational status and Decline in physical health  Historical Factors: Impulsivity  Risk Reduction Factors:   Sense of responsibility to family  Continued Clinical Symptoms:  Alcohol/Substance Abuse/Dependencies  Cognitive Features That Contribute To Risk:  None    Suicide Risk:  Minimal: No identifiable suicidal ideation.  Patients presenting with no risk factors but with morbid ruminations; may be classified as minimal risk based on the severity of the depressive symptoms  Follow-up Information    [facility] Follow [facility].   Why:  Referral made: 09/08/2018. If you are still interested in residential treatment at this facility, please contact Amy in admissions to check status or referral.  Contact information: [address] [place] [address] [phone]        [facility]. Go on 09/19/2018.   Specialty:  Behavioral Health Why:  Your hospital follow up appointment is Friday, 09/19/18 at 8:00a. Please bring: photo ID, proof of insurance, social security card, and any discharge paperwork.  Contact information: [address] [place] [address] [phone]        [facility] Follow up  on 09/23/2018.   Why:  Screening for possible admission on Tuesday, 1/21 at 7:45AM. You may also walk in for possible screening on 1/8 or 1/9 at 7:45AM. Please bring: photo ID/proof of [employer] residency, 30 day supply of all medications, and clothing. Thank you.  Contact information: [address] [place] [address] [phone]           Plan Of Care/Follow-up recommendations:  Activity:  full  Henry Ciullo, MD 09/09/2018, 11:36 AM

## 2018-09-09 NOTE — Progress Notes (Addendum)
CSW met with pt individually to discuss aftercare. Pt made aware that he has a [facility] screening on 1/21 but also can walk in on Wed/Thurs of this week at 7:45AM for screening (per June in admissions). Pt states that he has a 30 day supply of HIV meds at home and has been advised to bring those to screening along with supply provided by hospital of his psychiatric meds. Pt given facesheet as proof of [employer] residency and verbalized understanding that going to the [facility] screening DOES NOT guarentee that he will get a bed. Pt also encouraged to contact [facility] to check status of referral if [facility] does not accept him. Pt appreciative of efforts and plans to discharge Wednesday afternoon. He plans to stay with his sister for the night and she will transport him to [facility] Thursday morning for screening interview.  Randi Poullard S. Ouida Sills, MSW, LCSW Clinical Social Worker 09/09/2018 10:58 AM    Pt called CSW and asked to discharge today. He arranged to go to [facility] tomorrow morning with family. MD made aware and agreed to discharge pt today. Pt denies SI/HI/AVH and reports decreased depression. "I feel a lot more motivated and positive."   Vina Byrd S. Ouida Sills, MSW, LCSW Clinical Social Worker 09/09/2018 11:56 AM

## 2018-09-09 NOTE — Progress Notes (Signed)
D: Pt A & O X . Presents animated but anxious on interactions. Denies SI, HI, AVH and pain when assessed. D/C home as ordered. Picked up in lobby by my "girlfriend". A: D/C instructions reviewed with pt including prescriptions, medication samples and follow up appointments; compliance encouraged. All belongings from locker # 31 given to pt at time of departure. Scheduled and PRN medications given with verbal education and effects monitored. Safety checks maintained without incident till time of d/c.  R: Pt receptive to care. Compliant with medications when offered. Denies adverse drug reactions when assessed. Verbalized understanding related to d/c instructions. Signed belonging sheet in agreement with items received from locker. Ambulatory with a steady gait. Appears to be in no physical distress at time of departure.
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
## 2018-09-09 NOTE — Progress Notes (Signed)
  [facility] :  Will you be returning to the same living situation after discharge:  No. Pt plans to stay with his [facility] until [facility].  At discharge, do you have transportation home?: Yes,  sister Do you have the ability to pay for your medications: Yes,  mental health  Release of information consent forms completed and submitted to medical records by CSW.   Patient to Follow up at: Follow-up Information    [facility] Follow up.   Why:  Referral made: 09/08/2018. If you are still interested in residential treatment at this facility, please contact Amy in admissions to check status or referral.  Contact information: [address] [place] [address] [phone]        [facility]. Go on 09/19/2018.   Specialty:  Behavioral Health Why:  Your hospital follow up appointment is Friday, 09/19/18 at 8:00a. Please bring: photo ID, proof of insurance, social security card, and any discharge paperwork.  Contact information: [address] [place] [address] [phone]        [facility] Follow up on 09/23/2018.   Why:  Screening for possible admission on Tuesday, 1/21 at 7:45AM. You may also walk in for possible screening on 1/8 or 1/9 at 7:45AM. Please bring: photo ID/proof of [place], 30 day supply of all medications, and clothing. Thank you.  Contact information: [address] [place] [address] [phone]           Next level of care provider has access to [facility] Link:no  Safety Planning and Suicide Prevention discussed: Yes,  SPE completed with pt's sister and with pt; SPI pamphlet and mobile crisis information provided to pt.   Have you used any form of tobacco in the last 30 days? (Cigarettes, Smokeless Tobacco, Cigars, and/or Pipes): Yes  Has patient been referred to the Quitline?: Patient refused referral  Patient has been referred for  addiction treatment: Yes  Rona RavensHeather S [place], LCSW 09/09/2018, 11:51 AM

## 2018-10-12 ENCOUNTER — Encounter (HOSPITAL_COMMUNITY): Payer: Self-pay | Admitting: Emergency Medicine

## 2018-10-12 ENCOUNTER — Emergency Department (HOSPITAL_COMMUNITY)
Admission: EM | Admit: 2018-10-12 | Discharge: 2018-10-12 | Disposition: A | Discharge status: Home / Self Care | Payer: Self-pay | Attending: Emergency Medicine | Admitting: Emergency Medicine

## 2018-10-12 ENCOUNTER — Emergency Department (HOSPITAL_COMMUNITY): Payer: Self-pay

## 2018-10-12 DIAGNOSIS — B20 Human immunodeficiency virus [HIV] disease: Secondary | ICD-10-CM | POA: Insufficient documentation

## 2018-10-12 DIAGNOSIS — F17210 Nicotine dependence, cigarettes, uncomplicated: Secondary | ICD-10-CM | POA: Insufficient documentation

## 2018-10-12 DIAGNOSIS — Y929 Unspecified place or not applicable: Secondary | ICD-10-CM | POA: Insufficient documentation

## 2018-10-12 DIAGNOSIS — Z79899 Other long term (current) drug therapy: Secondary | ICD-10-CM | POA: Insufficient documentation

## 2018-10-12 DIAGNOSIS — Y998 Other external cause status: Secondary | ICD-10-CM | POA: Insufficient documentation

## 2018-10-12 DIAGNOSIS — S61412A Laceration without foreign body of left hand, initial encounter: Secondary | ICD-10-CM | POA: Insufficient documentation

## 2018-10-12 DIAGNOSIS — Y939 Activity, unspecified: Secondary | ICD-10-CM | POA: Insufficient documentation

## 2018-10-12 MED ORDER — HYDROCODONE-ACETAMINOPHEN 5-325 MG PO TABS
2.0000 | ORAL_TABLET | Freq: Once | ORAL | Status: AC
  Administered 2018-10-12: 2 via ORAL
  Filled 2018-10-12: qty 2

## 2018-10-12 MED ORDER — LIDOCAINE HCL (PF) 1 % IJ SOLN
30.0000 mL | Freq: Once | INTRAMUSCULAR | Status: AC
  Administered 2018-10-12: 30 mL
  Filled 2018-10-12: qty 30
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
## 2018-10-12 NOTE — ED Notes (Signed)
Patient is A&Ox4.  No signs of distress noted.  Please see providers complete history and physical exam.  

## 2018-10-12 NOTE — ED Notes (Signed)
PT states understanding of care given, follow up care. PT ambulated from ED to car with a steady gait.  
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
## 2018-12-15 ENCOUNTER — Telehealth: Payer: Self-pay

## 2018-12-15 NOTE — Telephone Encounter (Signed)
Patient is overdue for an appointment with Dr. Luciana Axe and labs. Unable to reach patient with number listed in chart. Patient needs to schedule an appointment with our office.   Gerarda Fraction, [place]

## 2018-12-26 NOTE — Telephone Encounter (Signed)
Called patient line rings busy   Sherre Scarlet  Referral Coordinator

## 2019-03-25 ENCOUNTER — Telehealth: Payer: Self-pay | Admitting: *Deleted

## 2019-03-25 NOTE — Telephone Encounter (Signed)
Will refer to [facility] per viral load list. Landis Gandy, RN

## 2019-04-06 ENCOUNTER — Other Ambulatory Visit: Payer: Self-pay

## 2019-04-06 ENCOUNTER — Encounter: Payer: Self-pay | Admitting: Internal Medicine

## 2019-04-06 ENCOUNTER — Other Ambulatory Visit: Payer: Self-pay | Admitting: *Deleted

## 2019-04-06 ENCOUNTER — Other Ambulatory Visit: Payer: Self-pay | Admitting: Internal Medicine

## 2019-04-06 DIAGNOSIS — Z79899 Other long term (current) drug therapy: Secondary | ICD-10-CM

## 2019-04-06 DIAGNOSIS — B20 Human immunodeficiency virus [HIV] disease: Secondary | ICD-10-CM

## 2019-04-06 DIAGNOSIS — Z113 Encounter for screening for infections with a predominantly sexual mode of transmission: Secondary | ICD-10-CM

## 2019-04-07 LAB — T-HELPER CELL (CD4) - (RCID CLINIC ONLY)
CD4 % Helper T Cell: 20 % — ABNORMAL LOW (ref 33–65)
CD4 T Cell Abs: 231 /uL — ABNORMAL LOW (ref 400–1790)

## 2019-04-08 LAB — URINE CYTOLOGY ANCILLARY ONLY
Chlamydia: NEGATIVE
Neisseria Gonorrhea: NEGATIVE

## 2019-04-12 LAB — CBC WITH DIFFERENTIAL/PLATELET
Absolute Monocytes: 310 cells/uL (ref 200–950)
Basophils Absolute: 19 cells/uL (ref 0–200)
Basophils Relative: 0.4 %
Eosinophils Absolute: 28 cells/uL (ref 15–500)
Eosinophils Relative: 0.6 %
HCT: 44.4 % (ref 38.5–50.0)
Hemoglobin: 15.4 g/dL (ref 13.2–17.1)
Lymphs Abs: 1156 cells/uL (ref 850–3900)
MCH: 34 pg — ABNORMAL HIGH (ref 27.0–33.0)
MCHC: 34.7 g/dL (ref 32.0–36.0)
MCV: 98 fL (ref 80.0–100.0)
MPV: 8.8 fL (ref 7.5–12.5)
Monocytes Relative: 6.6 %
Neutro Abs: 3187 cells/uL (ref 1500–7800)
Neutrophils Relative %: 67.8 %
Platelets: 239 10*3/uL (ref 140–400)
RBC: 4.53 10*6/uL (ref 4.20–5.80)
RDW: 12.8 % (ref 11.0–15.0)
Total Lymphocyte: 24.6 %
WBC: 4.7 10*3/uL (ref 3.8–10.8)

## 2019-04-12 LAB — COMPLETE METABOLIC PANEL WITH GFR
AG Ratio: 0.9 (calc) — ABNORMAL LOW (ref 1.0–2.5)
ALT: 28 U/L (ref 9–46)
AST: 34 U/L (ref 10–40)
Albumin: 3.8 g/dL (ref 3.6–5.1)
Alkaline phosphatase (APISO): 46 U/L (ref 36–130)
BUN: 10 mg/dL (ref 7–25)
CO2: 27 mmol/L (ref 20–32)
Calcium: 9.3 mg/dL (ref 8.6–10.3)
Chloride: 108 mmol/L (ref 98–110)
Creat: 1.02 mg/dL (ref 0.60–1.35)
GFR, Est African American: 108 mL/min/{1.73_m2} (ref >=60)
GFR, Est Non African American: 93 mL/min/{1.73_m2} (ref >=60)
Globulin: 4.3 g/dL (calc) — ABNORMAL HIGH (ref 1.9–3.7)
Glucose, Bld: 93 mg/dL (ref 65–99)
Potassium: 4 mmol/L (ref 3.5–5.3)
Sodium: 142 mmol/L (ref 135–146)
Total Bilirubin: 0.3 mg/dL (ref 0.2–1.2)
Total Protein: 8.1 g/dL (ref 6.1–8.1)

## 2019-04-12 LAB — LIPID PANEL
Cholesterol: 136 mg/dL (ref <200)
HDL: 59 mg/dL (ref >=40)
LDL Cholesterol (Calc): 64 mg/dL (calc)
Non-HDL Cholesterol (Calc): 77 mg/dL (calc) (ref <130)
Total CHOL/HDL Ratio: 2.3 (calc) (ref <5.0)
Triglycerides: 57 mg/dL (ref <150)

## 2019-04-12 LAB — HIV-1 RNA QUANT-NO REFLEX-BLD
HIV 1 RNA Quant: 61200 copies/mL — ABNORMAL HIGH
HIV-1 RNA Quant, Log: 4.79 Log copies/mL — ABNORMAL HIGH

## 2019-04-12 LAB — RPR: RPR Ser Ql: NONREACTIVE

## 2019-04-20 ENCOUNTER — Encounter: Payer: Self-pay | Admitting: Internal Medicine

## 2019-08-06 ENCOUNTER — Telehealth: Payer: Self-pay | Admitting: *Deleted

## 2019-08-12 ENCOUNTER — Encounter: Payer: Self-pay | Admitting: Internal Medicine

## 2019-08-12 ENCOUNTER — Other Ambulatory Visit: Payer: Self-pay | Admitting: Internal Medicine

## 2019-08-12 DIAGNOSIS — B20 Human immunodeficiency virus [HIV] disease: Secondary | ICD-10-CM

## 2019-08-12 NOTE — Progress Notes (Signed)
Patient ID: Henry [name], male   DOB: 1981-05-28, 38 y.o.   MRN: [id number] Patient called states he will not be back to [facility] that he is currently at [facility] in [place] and will be transferring his care to [facility]  He request that Dr [name] put a referral in [place] number [phone] ext [phone]

## 2019-08-13 ENCOUNTER — Encounter: Payer: Self-pay | Admitting: Internal Medicine

## 2019-08-13 ENCOUNTER — Other Ambulatory Visit: Payer: Self-pay

## 2019-08-13 ENCOUNTER — Ambulatory Visit (INDEPENDENT_AMBULATORY_CARE_PROVIDER_SITE_OTHER): Payer: Self-pay | Admitting: Internal Medicine

## 2019-08-13 DIAGNOSIS — B20 Human immunodeficiency virus [HIV] disease: Secondary | ICD-10-CM

## 2019-08-13 NOTE — Progress Notes (Signed)
   Subjective:    Patient ID: Guinn Delarosa, male    DOB: 1980/10/30, 38 y.o.   MRN: [id number]  HPI Phone visit completed, 8 minutes.  I connected with  Hilma Favors on 08/13/19 by phone and verified that I am speaking with the correct person using two identifiers.   I discussed the limitations of evaluation and management by telemedicine. The patient expressed understanding and agreed to proceed.  Nicolai is now in [place] and in a program to help him get better from his drug abuse.  He has a long history of substance abuse and very sporadic clinic visits and intermittent compliance with medication.  He left [facility] now with the determination to cut negative ties and have a clean start.  He has not particular complaints at this time.  He has a very positive outlook.  He wanted to thank me and our clinic staff for his care and have closure with [place].   We have put him in contact with the [facility] ID clinic and he has an appointment later this month.    He will follow up here as needed.      Review of Systems     Objective:   Physical Exam        Assessment & Plan:
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
## 2020-07-23 ENCOUNTER — Ambulatory Visit (HOSPITAL_COMMUNITY)
Admission: AD | Admit: 2020-07-23 | Discharge: 2020-07-23 | Disposition: A | Discharge status: Home / Self Care | Payer: Self-pay | Attending: Psychiatry | Admitting: Psychiatry

## 2020-07-23 DIAGNOSIS — Z1330 Encounter for screening examination for mental health and behavioral disorders, unspecified: Secondary | ICD-10-CM | POA: Insufficient documentation
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
## 2020-07-23 NOTE — H&P (Signed)
Behavioral Health Medical Screening Exam  Henry [name] is an 39 y.o. male who presents to [facility] as walk-in. Patient reports recently discharging from inpatient substance abuse facility due to feeling uncomfortable related sexual identification/orientation. Patient states he is not interested in using substances at this time and currently denies any active suicidal/homicidal ideations, auditory/visual hallucinations, and does not appear to be responding to any external/internal stimuli. Patient voiced several social needs including housing and food; pt provided lunch tray. Patient states he is attempting to contact his sister for housing; provider and TTS worker discussed local shelters as secondary option, provided printout and bus tickets (2). Provider discussed safety plan in case he begins to feel unsafe and/or experience suicidal/homicidal ideations; patient reassured provider and TTS worker his will to live and remain sober.   Total Time spent with patient: 15 minutes  Psychiatric Specialty Exam: Physical Exam Nursing note reviewed.  Psychiatric:        Mood and Affect: Mood normal.        Behavior: Behavior normal.        Thought Content: Thought content normal.        Judgment: Judgment normal.    Review of Systems  Psychiatric/Behavioral: Negative.   All other systems reviewed and are negative.  Blood pressure (!) 138/100, pulse 75, temperature 97.7 F (36.5 C), temperature source Oral, resp. rate 18, SpO2 99 %.There is no height or weight on file to calculate BMI. General Appearance: Casual Eye Contact:  Fair Speech:  Clear and Coherent Volume:  Normal Mood:  Euthymic Affect:  Congruent Thought Process:  Coherent and Goal Directed Orientation:  Full (Time, Place, and Person) Thought Content:  Logical Suicidal Thoughts:  No Homicidal Thoughts:  No Memory:  Immediate;   Fair Recent;   Fair Remote;   Fair Judgement:  Fair Insight:  Fair and Present Psychomotor Activity:   Normal Concentration: Concentration: Fair and Attention Span: Fair Recall:  [employer] of Knowledge:Fair Language: Fair Akathisia:  NA Handed:  Right AIMS (if indicated):    Assets:  Communication Skills Desire for Improvement Leisure Time Physical Health Resilience Social Support Sleep:     Musculoskeletal: Strength & Muscle Tone: within normal limits Gait & Station: normal Patient leans: N/A  Blood pressure (!) 138/100, pulse 75, temperature 97.7 F (36.5 C), temperature source Oral, resp. rate 18, SpO2 99 %.  Patient presents calm and cooperative; no visible psychosis noted at this time. He is currently denying any active suicidal/homicidal ideations, auditory/visual hallucinations, and does not appear to be responding to any external/internal stimuli at this time.   Recommendations: Based on my evaluation the patient does not appear to have an emergency medical condition.  Loletta Parish, NP 07/23/2020, 7:11 PM

## 2020-10-05 ENCOUNTER — Encounter (HOSPITAL_COMMUNITY): Payer: Self-pay | Admitting: *Deleted

## 2020-10-05 ENCOUNTER — Emergency Department (HOSPITAL_COMMUNITY)
Admission: EM | Admit: 2020-10-05 | Discharge: 2020-10-05 | Disposition: A | Discharge status: Home / Self Care | Payer: Self-pay | Attending: Emergency Medicine | Admitting: Emergency Medicine

## 2020-10-05 ENCOUNTER — Other Ambulatory Visit: Payer: Self-pay

## 2020-10-05 DIAGNOSIS — R21 Rash and other nonspecific skin eruption: Secondary | ICD-10-CM | POA: Insufficient documentation

## 2020-10-05 DIAGNOSIS — K649 Unspecified hemorrhoids: Secondary | ICD-10-CM | POA: Insufficient documentation

## 2020-10-05 DIAGNOSIS — Z5321 Procedure and treatment not carried out due to patient leaving prior to being seen by health care provider: Secondary | ICD-10-CM | POA: Insufficient documentation

## 2020-10-05 NOTE — ED Notes (Signed)
Pt stated he can not wait any longer and is leaving. Pt stated he is going to get drugs instead

## 2020-10-05 NOTE — ED Triage Notes (Signed)
Pt stating rash on bilateral buttocks for about 2 months. Also having issue with hemorrhoids. Has been using preparation H and hydrocortisone cream.

## 2020-10-16 ENCOUNTER — Emergency Department (HOSPITAL_COMMUNITY)
Admission: EM | Admit: 2020-10-16 | Discharge: 2020-10-17 | Disposition: A | Discharge status: Other Acute Inpatient Hospital | Payer: Self-pay | Attending: Emergency Medicine | Admitting: Emergency Medicine

## 2020-10-16 ENCOUNTER — Other Ambulatory Visit: Payer: Self-pay

## 2020-10-16 DIAGNOSIS — F1099 Alcohol use, unspecified with unspecified alcohol-induced disorder: Secondary | ICD-10-CM | POA: Insufficient documentation

## 2020-10-16 DIAGNOSIS — Z20822 Contact with and (suspected) exposure to covid-19: Secondary | ICD-10-CM | POA: Insufficient documentation

## 2020-10-16 DIAGNOSIS — Z21 Asymptomatic human immunodeficiency virus [HIV] infection status: Secondary | ICD-10-CM | POA: Insufficient documentation

## 2020-10-16 DIAGNOSIS — R45851 Suicidal ideations: Secondary | ICD-10-CM | POA: Insufficient documentation

## 2020-10-16 DIAGNOSIS — F332 Major depressive disorder, recurrent severe without psychotic features: Secondary | ICD-10-CM | POA: Insufficient documentation

## 2020-10-16 DIAGNOSIS — Z046 Encounter for general psychiatric examination, requested by authority: Secondary | ICD-10-CM | POA: Insufficient documentation

## 2020-10-16 DIAGNOSIS — F1420 Cocaine dependence, uncomplicated: Secondary | ICD-10-CM | POA: Insufficient documentation

## 2020-10-16 DIAGNOSIS — F17210 Nicotine dependence, cigarettes, uncomplicated: Secondary | ICD-10-CM | POA: Insufficient documentation

## 2020-10-16 LAB — CBC
HCT: 43.2 % (ref 39.0–52.0)
Hemoglobin: 15 g/dL (ref 13.0–17.0)
MCH: 31.8 pg (ref 26.0–34.0)
MCHC: 34.7 g/dL (ref 30.0–36.0)
MCV: 91.5 fL (ref 80.0–100.0)
Platelets: 267 10*3/uL (ref 150–400)
RBC: 4.72 MIL/uL (ref 4.22–5.81)
RDW: 12.8 % (ref 11.5–15.5)
WBC: 8.6 10*3/uL (ref 4.0–10.5)
nRBC: 0 % (ref 0.0–0.2)

## 2020-10-16 LAB — SALICYLATE LEVEL: Salicylate Lvl: <7 mg/dL — ABNORMAL LOW (ref 7.0–30.0)

## 2020-10-16 LAB — COMPREHENSIVE METABOLIC PANEL
ALT: 18 U/L (ref 0–44)
AST: 21 U/L (ref 15–41)
Albumin: 2.9 g/dL — ABNORMAL LOW (ref 3.5–5.0)
Alkaline Phosphatase: 64 U/L (ref 38–126)
Anion gap: 9 (ref 5–15)
BUN: <5 mg/dL — ABNORMAL LOW (ref 6–20)
CO2: 28 mmol/L (ref 22–32)
Calcium: 8.5 mg/dL — ABNORMAL LOW (ref 8.9–10.3)
Chloride: 97 mmol/L — ABNORMAL LOW (ref 98–111)
Creatinine, Ser: 0.97 mg/dL (ref 0.61–1.24)
GFR, Estimated: >60 mL/min (ref >60)
Glucose, Bld: 89 mg/dL (ref 70–99)
Potassium: 4.1 mmol/L (ref 3.5–5.1)
Sodium: 134 mmol/L — ABNORMAL LOW (ref 135–145)
Total Bilirubin: 0.6 mg/dL (ref 0.3–1.2)
Total Protein: 8 g/dL (ref 6.5–8.1)

## 2020-10-16 LAB — ACETAMINOPHEN LEVEL: Acetaminophen (Tylenol), Serum: <10 ug/mL — ABNORMAL LOW (ref 10–30)

## 2020-10-16 LAB — RESP PANEL BY RT-PCR (FLU A&B, COVID) ARPGX2
Influenza A by PCR: NEGATIVE
Influenza B by PCR: NEGATIVE
SARS Coronavirus 2 by RT PCR: NEGATIVE

## 2020-10-16 LAB — RAPID URINE DRUG SCREEN, HOSP PERFORMED
Amphetamines: NOT DETECTED
Barbiturates: NOT DETECTED
Benzodiazepines: NOT DETECTED
Cocaine: POSITIVE — AB
Opiates: NOT DETECTED
Tetrahydrocannabinol: NOT DETECTED

## 2020-10-16 LAB — ETHANOL: Alcohol, Ethyl (B): 23 mg/dL — ABNORMAL HIGH (ref <10)

## 2020-10-16 MED ORDER — ARIPIPRAZOLE 10 MG PO TABS
10.0000 mg | ORAL_TABLET | Freq: Every day | ORAL | Status: DC
  Administered 2020-10-17: 10 mg via ORAL
  Filled 2020-10-16: qty 1

## 2020-10-16 MED ORDER — BICTEGRAVIR-EMTRICITAB-TENOFOV 50-200-25 MG PO TABS
1.0000 | ORAL_TABLET | Freq: Every day | ORAL | Status: DC
  Administered 2020-10-16 – 2020-10-17 (×2): 1 via ORAL
  Filled 2020-10-16 (×2): qty 1

## 2020-10-16 MED ORDER — BUSPIRONE HCL 10 MG PO TABS
15.0000 mg | ORAL_TABLET | Freq: Three times a day (TID) | ORAL | Status: DC
  Administered 2020-10-16 – 2020-10-17 (×4): 15 mg via ORAL
  Filled 2020-10-16 (×4): qty 2

## 2020-10-16 MED ORDER — METOPROLOL SUCCINATE ER 100 MG PO TB24
100.0000 mg | ORAL_TABLET | Freq: Every day | ORAL | Status: DC
  Administered 2020-10-16 – 2020-10-17 (×2): 100 mg via ORAL
  Filled 2020-10-16 (×2): qty 1

## 2020-10-16 NOTE — ED Provider Notes (Signed)
[facility]EMERGENCY DEPT [facility] Emergency Department Provider Note MRN:  [id number]  Arrival date & time: 10/16/20     Chief Complaint   Suicidal   History of Present Illness   Henry [name] is a 40 y.o. year-old male with a history of bipolar disorder, HIV presenting to the ED with chief complaint of suicidal ideation.  Patient explains that he wants to die.  Has been trying to kill himself for a few months by not taking his HIV medication.  Has also attempted overdose on over-the-counter medications and his home gabapentin a few times.  Yesterday he came very close to jumping off of a bridge.  He is here for help instead.  Also admits to smoking excessive amounts of unnamed substance today in order to try to kill self but thinks that he did not have enough of the substance.  Review of Systems  A complete 10 system review of systems was obtained and all systems are negative except as noted in the HPI and PMH.   Patient's Health History    Past Medical History:  Diagnosis Date  . Anxiety   . Bipolar 1 disorder (HCC)   . HIV (human immunodeficiency virus infection) (HCC)     No past surgical history on file.  Family History  Problem Relation Age of Onset  . Thyroid disease Mother   . Heart disease Maternal Grandmother   . Breast cancer Maternal Grandmother     Social History   Socioeconomic History  . Marital status: Single    Spouse name: Not on file  . Number of children: Not on file  . Years of education: Not on file  . Highest education level: Not on file  Occupational History  . Not on file  Tobacco Use  . Smoking status: Current Every Day Smoker    Packs/day: 1.00    Years: 14.00    Pack years: 14.00    Types: Cigarettes  . Smokeless tobacco: Never Used  . Tobacco comment: trying to cut back  Vaping Use  . Vaping Use: Never used  Substance and Sexual Activity  . Alcohol use: Yes    Alcohol/week: 0.0 standard drinks    Comment: 2 a day off and on  .  Drug use: Yes    Types: Marijuana, Cocaine, Amphetamines, Benzodiazepines  . Sexual activity: Yes    Partners: Male    Birth control/protection: None, Condom  Other Topics Concern  . Not on file  Social History Narrative  . Not on file   Social Determinants of Health   Financial Resource Strain: Not on file  Food Insecurity: Not on file  Transportation Needs: Not on file  Physical Activity: Not on file  Stress: Not on file  Social Connections: Not on file  Intimate Partner Violence: Not on file     Physical Exam   Vitals:   10/16/20 0243  BP: (!) 137/97  Pulse: 94  Resp: 16  Temp: 98.2 F (36.8 C)  SpO2: 100%    CONSTITUTIONAL: Well-appearing, NAD NEURO:  Alert and oriented x 3, no focal deficits EYES:  eyes equal and reactive ENT/NECK:  no LAD, no JVD CARDIO: Regular rate, well-perfused, normal S1 and S2 PULM:  CTAB no wheezing or rhonchi GI/GU:  normal bowel sounds, non-distended, non-tender MSK/SPINE:  No gross deformities, no edema SKIN:  no rash, atraumatic PSYCH:  Appropriate speech and behavior  *Additional and/or pertinent findings included in MDM below  Diagnostic and Interventional Summary    EKG Interpretation  Date/Time: October 16, 2020 at 6:57 AM   Ventricular Rate:  99 PR Interval:  142 QRS Duration: 87 QT Interval:  344 QTC Calculation: 441 R Axis:     Text Interpretation: Sinus rhythm normal intervals Confirmed by Dr. Kennis Carina at 7:01 AM      Labs Reviewed  COMPREHENSIVE METABOLIC PANEL - Abnormal; Notable for the following components:      Result Value   Sodium 134 (*)    Chloride 97 (*)    BUN <5 (*)    Calcium 8.5 (*)    Albumin 2.9 (*)    All other components within normal limits  ETHANOL - Abnormal; Notable for the following components:   Alcohol, Ethyl (B) 23 (*)    All other components within normal limits  SALICYLATE LEVEL - Abnormal; Notable for the following components:   Salicylate Lvl <7.0 (*)    All other  components within normal limits  ACETAMINOPHEN LEVEL - Abnormal; Notable for the following components:   Acetaminophen (Tylenol), Serum <10 (*)    All other components within normal limits  RAPID URINE DRUG SCREEN, HOSP PERFORMED - Abnormal; Notable for the following components:   Cocaine POSITIVE (*)    All other components within normal limits  CBC    No orders to display    Medications - No data to display   Procedures  /  Critical Care Procedures  ED Course and Medical Decision Making  I have reviewed the triage vital signs, the nursing notes, and pertinent available records from the EMR.  Listed above are laboratory and imaging tests that I personally ordered, reviewed, and interpreted and then considered in my medical decision making (see below for details).  Suicidal, specific plan and recent attempts.  IVC paperwork initiated, labs overall reassuring.  Awaiting EKG for medical clearance and then will consult TTS.     Patient is medically cleared awaiting psych recs.  Elmer Sow. Pilar Plate, MD [facility] Emergency Medicine [facility] [email or web address]  Final Clinical Impressions(s) / ED Diagnoses     ICD-10-CM   1. Suicidal ideation  R45.851     ED Discharge Orders    None       Discharge Instructions Discussed with and Provided to Patient:   Discharge Instructions   None       Sabas Sous, MD 10/16/20 [phone]

## 2020-10-16 NOTE — ED Provider Notes (Signed)
Emergency Medicine Observation Re-evaluation Note  Henry [name] is a 40 y.o. male, seen on rounds today.  Pt initially presented to the ED for complaints of Suicidal Currently, the patient is resting on bed.  Physical Exam  BP 140/78   Pulse 89   Temp 98.5 F (36.9 C) (Oral)   Resp 16   SpO2 97%  Physical Exam General: laying on stretcher Cardiac: rrr Lungs: cta Psych: no complaints  ED Course / MDM  EKG:    I have reviewed the labs performed to date as well as medications administered while in observation.  Recent changes in the last 24 hours include behavioral health assessed and advised inpatient.  Plan  Current plan is for inpatient psych admission. Patient is under full IVC at this time.   Margarita Grizzle, MD 10/16/20 1340
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
## 2020-10-16 NOTE — BH Assessment (Signed)
Per Melbourne Abts, PA patient meets inpatient care criteria. Disposition Counselor notified [facility] (Brook, RN) of patient's bed needs.

## 2020-10-16 NOTE — ED Triage Notes (Signed)
Pt presents to ED POV. Pt c/o SI. Pt reports that he has been purposefully not taking home medication, including HIV meds. Pt reports SI d/t medically hx and confusion about gender

## 2020-10-17 ENCOUNTER — Inpatient Hospital Stay (HOSPITAL_COMMUNITY)
Admission: AD | Admit: 2020-10-17 | Discharge: 2020-10-26 | DRG: 885 | Disposition: A | Discharge status: Home / Self Care | Payer: Federal, State, Local not specified - Other | Source: Intra-hospital | Attending: Psychiatry | Admitting: Psychiatry

## 2020-10-17 DIAGNOSIS — R45851 Suicidal ideations: Secondary | ICD-10-CM | POA: Diagnosis present

## 2020-10-17 DIAGNOSIS — F3181 Bipolar II disorder: Principal | ICD-10-CM

## 2020-10-17 DIAGNOSIS — F17200 Nicotine dependence, unspecified, uncomplicated: Secondary | ICD-10-CM | POA: Diagnosis present

## 2020-10-17 DIAGNOSIS — B20 Human immunodeficiency virus [HIV] disease: Secondary | ICD-10-CM | POA: Diagnosis present

## 2020-10-17 DIAGNOSIS — Y901 Blood alcohol level of 20-39 mg/100 ml: Secondary | ICD-10-CM | POA: Diagnosis present

## 2020-10-17 DIAGNOSIS — F17210 Nicotine dependence, cigarettes, uncomplicated: Secondary | ICD-10-CM | POA: Diagnosis present

## 2020-10-17 DIAGNOSIS — F3130 Bipolar disorder, current episode depressed, mild or moderate severity, unspecified: Secondary | ICD-10-CM | POA: Diagnosis present

## 2020-10-17 DIAGNOSIS — I10 Essential (primary) hypertension: Secondary | ICD-10-CM | POA: Diagnosis present

## 2020-10-17 DIAGNOSIS — F332 Major depressive disorder, recurrent severe without psychotic features: Secondary | ICD-10-CM | POA: Diagnosis present

## 2020-10-17 DIAGNOSIS — A630 Anogenital (venereal) warts: Secondary | ICD-10-CM

## 2020-10-17 DIAGNOSIS — F1010 Alcohol abuse, uncomplicated: Secondary | ICD-10-CM | POA: Diagnosis present

## 2020-10-17 DIAGNOSIS — G4700 Insomnia, unspecified: Secondary | ICD-10-CM | POA: Diagnosis present

## 2020-10-17 DIAGNOSIS — F419 Anxiety disorder, unspecified: Secondary | ICD-10-CM | POA: Diagnosis present

## 2020-10-17 DIAGNOSIS — K30 Functional dyspepsia: Secondary | ICD-10-CM | POA: Diagnosis present

## 2020-10-17 DIAGNOSIS — F1410 Cocaine abuse, uncomplicated: Secondary | ICD-10-CM | POA: Diagnosis present

## 2020-10-17 DIAGNOSIS — K5900 Constipation, unspecified: Secondary | ICD-10-CM | POA: Diagnosis present

## 2020-10-17 LAB — T-HELPER CELLS (CD4) COUNT (NOT AT ARMC)
CD4 % Helper T Cell: 18 % — ABNORMAL LOW (ref 33–65)
CD4 T Cell Abs: 445 /uL (ref 400–1790)

## 2020-10-17 MED ORDER — IBUPROFEN 600 MG PO TABS
600.0000 mg | ORAL_TABLET | Freq: Four times a day (QID) | ORAL | Status: DC | PRN
  Administered 2020-10-17 – 2020-10-25 (×7): 600 mg via ORAL
  Filled 2020-10-17 (×6): qty 1
  Filled 2020-10-17: qty 10
  Filled 2020-10-17: qty 1

## 2020-10-17 MED ORDER — BUSPIRONE HCL 15 MG PO TABS
15.0000 mg | ORAL_TABLET | Freq: Three times a day (TID) | ORAL | Status: DC
  Administered 2020-10-18 – 2020-10-20 (×9): 15 mg via ORAL
  Filled 2020-10-17 (×13): qty 1

## 2020-10-17 MED ORDER — LOPERAMIDE HCL 2 MG PO CAPS: 2.0000 mg | ORAL_CAPSULE | ORAL | Status: AC | PRN

## 2020-10-17 MED ORDER — THIAMINE HCL 100 MG PO TABS
100.0000 mg | ORAL_TABLET | Freq: Every day | ORAL | Status: DC
  Administered 2020-10-18 – 2020-10-25 (×8): 100 mg via ORAL
  Filled 2020-10-17 (×11): qty 1

## 2020-10-17 MED ORDER — BICTEGRAVIR-EMTRICITAB-TENOFOV 50-200-25 MG PO TABS
1.0000 | ORAL_TABLET | Freq: Every day | ORAL | Status: DC
  Administered 2020-10-18 – 2020-10-25 (×8): 1 via ORAL
  Filled 2020-10-17: qty 1
  Filled 2020-10-17: qty 2
  Filled 2020-10-17 (×4): qty 1
  Filled 2020-10-17 (×2): qty 2
  Filled 2020-10-17 (×2): qty 1
  Filled 2020-10-17: qty 2
  Filled 2020-10-17: qty 1

## 2020-10-17 MED ORDER — TRAZODONE HCL 50 MG PO TABS
50.0000 mg | ORAL_TABLET | Freq: Every evening | ORAL | Status: DC | PRN
  Administered 2020-10-17: 50 mg via ORAL
  Filled 2020-10-17: qty 1

## 2020-10-17 MED ORDER — LORAZEPAM 1 MG PO TABS
1.0000 mg | ORAL_TABLET | Freq: Four times a day (QID) | ORAL | Status: AC | PRN
  Administered 2020-10-19: 1 mg via ORAL
  Filled 2020-10-17: qty 1

## 2020-10-17 MED ORDER — HYDROXYZINE HCL 25 MG PO TABS
25.0000 mg | ORAL_TABLET | Freq: Four times a day (QID) | ORAL | Status: AC | PRN
  Administered 2020-10-18 – 2020-10-20 (×5): 25 mg via ORAL
  Filled 2020-10-17 (×5): qty 1

## 2020-10-17 MED ORDER — ADULT MULTIVITAMIN W/MINERALS CH
1.0000 | ORAL_TABLET | Freq: Every day | ORAL | Status: DC
  Administered 2020-10-18 – 2020-10-25 (×8): 1 via ORAL
  Filled 2020-10-17 (×9): qty 1

## 2020-10-17 MED ORDER — LIDOCAINE 5 % EX OINT
TOPICAL_OINTMENT | Freq: Three times a day (TID) | CUTANEOUS | Status: DC | PRN
  Administered 2020-10-18 – 2020-10-19 (×3): 1 via TOPICAL
  Filled 2020-10-17 (×3): qty 30

## 2020-10-17 MED ORDER — METOPROLOL SUCCINATE ER 50 MG PO TB24
100.0000 mg | ORAL_TABLET | Freq: Every day | ORAL | Status: DC
  Administered 2020-10-18 – 2020-10-25 (×8): 100 mg via ORAL
  Filled 2020-10-17: qty 1
  Filled 2020-10-17 (×2): qty 28
  Filled 2020-10-17 (×4): qty 1
  Filled 2020-10-17: qty 28
  Filled 2020-10-17 (×3): qty 1

## 2020-10-17 MED ORDER — ACETAMINOPHEN 325 MG PO TABS
650.0000 mg | ORAL_TABLET | Freq: Four times a day (QID) | ORAL | Status: DC | PRN
  Administered 2020-10-19 – 2020-10-21 (×3): 650 mg via ORAL
  Filled 2020-10-17 (×3): qty 2

## 2020-10-17 MED ORDER — ARIPIPRAZOLE 10 MG PO TABS
10.0000 mg | ORAL_TABLET | Freq: Every day | ORAL | Status: DC
  Administered 2020-10-18 – 2020-10-21 (×4): 10 mg via ORAL
  Filled 2020-10-17 (×5): qty 1

## 2020-10-17 MED ORDER — ONDANSETRON 4 MG PO TBDP: 4.0000 mg | ORAL_TABLET | Freq: Four times a day (QID) | ORAL | Status: AC | PRN

## 2020-10-17 MED ORDER — HYDROXYZINE HCL 25 MG PO TABS
25.0000 mg | ORAL_TABLET | Freq: Three times a day (TID) | ORAL | Status: DC | PRN
  Administered 2020-10-17: 25 mg via ORAL
  Filled 2020-10-17: qty 1

## 2020-10-17 MED ORDER — MAGNESIUM HYDROXIDE 400 MG/5ML PO SUSP: 30.0000 mL | Freq: Every day | ORAL | Status: DC | PRN

## 2020-10-17 MED ORDER — ALUM & MAG HYDROXIDE-SIMETH 200-200-20 MG/5ML PO SUSP: 30.0000 mL | ORAL | Status: DC | PRN

## 2020-10-17 NOTE — ED Notes (Signed)
Cell phone and belongings all returned to patient and he reports all thing are present.

## 2020-10-17 NOTE — ED Notes (Signed)
Dinner Tray Ordered @ 1749. 

## 2020-10-17 NOTE — ED Notes (Signed)
Patient finished 100% of breakfast tray and juice. He is back to sleep on stretcher at this time. No needs identified.

## 2020-10-17 NOTE — Progress Notes (Cosign Needed)
Notified by Nuala Alpha, RN that patient has been complaining of painful lesions in his perianal and anogentital area. Patient states he has had these lesions for the past 1-2 months. Patient examined by Nuala Alpha, RN and myself in patient's room on the adult unit. Exam shows multiple, slightly inflamed, pink, coalescing HPV/condyloma-appearing lesions in patient's perianal area as well as in the patient's rectal area and on the patient's scrotum. No topical immunosuppressive medications on formulary at this time. To help better manage patient's pain and help patient feel more comfortable at this time, orders placed for Ibuprofen 600 mg PO q6H PRN and Lidocaine 5% ointment TID PRN. Tylenol 650 mg PO Q6H PRN to remain ordered for patient as well. Attempted to call Infectious Disease, but was unsuccessful in reaching a provider. Reviewed the patient with Dr. Madilyn [facility]) via phone regarding any additional suggestions for helping improve patient's pain relief at this time, who agreed that Tylenol, Ibuprofen, and topical Lidocaine would be the best options for the patient while he is at [facility] at this time. Per Nursing, patient has not been taking his Biktarvy prior to his [facility] admission. Recommend that patient follow up closely with Infectious Disease after his future discharge from [facility] in order to better control his HIV and  HPV/condyloma-appearing lesions. Nuala Alpha, RN to give report at shift change regarding the patient.

## 2020-10-17 NOTE — ED Provider Notes (Addendum)
Emergency Medicine Observation Re-evaluation Note  Henry [name] is a 40 y.o. male, seen on rounds today.  Pt initially presented to the ED for complaints of Suicidal Currently, the patient is resting comfortably - easily aroused. No new c/o.   Physical Exam  BP (!) 142/68   Pulse 68   Temp 98.2 F (36.8 C)   Resp 16   Ht 1.854 m (6\' 1" )   Wt 81.6 kg   SpO2 99%   BMI 23.73 kg/m  Physical Exam General: content, no distress Cardiac: regular rate Lungs: breathing comfortably Psych: flat affect, depressed mood  ED Course / MDM  EKG:EKG Interpretation  Date/Time:  Sunday October 16 2020 06:57:53 EST Ventricular Rate:  99 PR Interval:  142 QRS Duration: 86 QT Interval:  344 QTC Calculation: 441 R Axis:   59 Text Interpretation: Normal sinus rhythm Normal ECG No previous ECGs available Confirmed by [id number] (Zadie Rhine) on 10/17/2020 2:48:31 AM    I have reviewed the labs performed to date as well as medications administered while in observation.  Recent changes in the last 24 hours include BH reassessment/observation, medication management.  Plan  Current plan is for inpatient psychiatric care - it is not clear why patient has not been moved to [facility] or [facility] at this point - will ask for reassessment.   1550, pt accepted to [facility].  Pt is alert, content, appears stable for transport to [facility].    [facility], MD 10/17/20 [phone]

## 2020-10-17 NOTE — Progress Notes (Signed)
Pt accepted to [facility], bed 306-1   NP is the accepting provider.    Dr. is the attending provider.    Call report to [phone]    [facility] @ [facility] ED notified.     Pt is voluntary and will be transported by [employer]  Pt is scheduled to arrive at [facility] at 530pm.   Wells Guiles, MSW, LCSW, LCAS Clinical Social Worker II Disposition CSW [phone]

## 2020-10-17 NOTE — BH Assessment (Signed)
This Clinical research associate spoke with patient this date to evaluate current mental health status. Patient contnues to voice ongoing S/I stating he would plan to overdose on street drugs if discharged. Patient reports that he "never wanted to die this bad." Patient states that he also has not been taking his HIV medications "hoping to die." Patient reports he also has ongoing SA issues stating he "can't get off crack." Case was staffed with [facility] NP who recommended a continued inpatient admission as placement is investigated.

## 2020-10-17 NOTE — ED Notes (Signed)
Patient care assumed by this RN. Patient is sleeping on stretcher in hallway next to nursing station. Respirations are even and unlabored. No distress or agitation noted. No needs identified at this time.

## 2020-10-18 ENCOUNTER — Encounter (HOSPITAL_COMMUNITY): Payer: Self-pay | Admitting: Family

## 2020-10-18 ENCOUNTER — Other Ambulatory Visit: Payer: Self-pay

## 2020-10-18 DIAGNOSIS — F332 Major depressive disorder, recurrent severe without psychotic features: Secondary | ICD-10-CM

## 2020-10-18 DIAGNOSIS — F3181 Bipolar II disorder: Secondary | ICD-10-CM

## 2020-10-18 LAB — LIPID PANEL
Cholesterol: 121 mg/dL (ref 0–200)
HDL: 25 mg/dL — ABNORMAL LOW (ref >40)
LDL Cholesterol: 63 mg/dL (ref 0–99)
Total CHOL/HDL Ratio: 4.8 RATIO
Triglycerides: 164 mg/dL — ABNORMAL HIGH (ref <150)
VLDL: 33 mg/dL (ref 0–40)

## 2020-10-18 LAB — TSH: TSH: 2.043 u[IU]/mL (ref 0.350–4.500)

## 2020-10-18 LAB — HIV-1 RNA QUANT-NO REFLEX-BLD

## 2020-10-18 MED ORDER — MIRTAZAPINE 15 MG PO TABS
15.0000 mg | ORAL_TABLET | Freq: Every day | ORAL | Status: DC
  Administered 2020-10-18 – 2020-10-25 (×8): 15 mg via ORAL
  Filled 2020-10-18 (×5): qty 1
  Filled 2020-10-18: qty 14
  Filled 2020-10-18: qty 1
  Filled 2020-10-18 (×2): qty 14
  Filled 2020-10-18 (×2): qty 1

## 2020-10-18 NOTE — Progress Notes (Signed)
Assessed perianal and perigenital area of patient with Melbourne Abts, PA. Inflamed lesions on perianal and perigenital area along with scrotal area. Patient reports pain 10/10 in area. Patient given PRN 600mg  Ibuprofen for inflammation and pain. Topical lidocaine order received from , Melbourne Abts. Will continue to monitor.

## 2020-10-18 NOTE — Progress Notes (Signed)
   10/18/20 0619  Vital Signs  Temp (!) 97.5 F (36.4 C)  Temp Source Oral  Pulse Rate 79  Pulse Rate Source Dinamap  Resp 17  BP 132/85  BP Location Right Arm  BP Method Automatic  Patient Position (if appropriate) Sitting    D: Patient denies SI/HI/AVH. Patient rated anxiety 8/10 and depression 9/10. Pt reported that he feels like he has a "little hope" Pt. Isolated in his room, in his bed with his covers pulled up to his face. Pt. Complained of pain in his groin/peri area. A:  Patient took scheduled medicine.Pt. was given lidocaine ointment, for skin irritation in groin/peri area.  Support and encouragement provided Routine safety checks conducted every 15 minutes. Patient  Informed to notify staff with any concerns.   R: Safety maintained.

## 2020-10-18 NOTE — [facility] Counselor (Signed)
CSW provided patient with a packet of resources that includes: shelters and housing, food, [facility] Card application, [employer], and suicide prevention information.  CSW will continue to work with patient on arranging further discharge planning.
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
## 2020-10-18 NOTE — [facility] Suicide Risk Assessment (Signed)
[facility] INPATIENT:  Family/Significant Other Suicide Prevention Education  Suicide Prevention Education:  Patient Refusal for Family/Significant Other Suicide Prevention Education: The patient Henry [name] has refused to provide written consent for family/significant other to be provided Family/Significant Other Suicide Prevention Education during admission and/or prior to discharge.  Physician notified.  Felizardo Hoffmann 10/18/2020, 10:51 AM

## 2020-10-18 NOTE — Progress Notes (Signed)
Admission Note: 10/17/2020 Henry [name] [id number] Patient is 40 year old male who presented voluntarily to [facility] from [facility]. Patient reports that he wants to die at time of assessment and has been increasing the amount of crack he uses to kill himself. Patient also reports not taking HIV medication. Patient presents with sad and depressed affect and reports hopelessness over the past few months. Patient states he is tired of people pleasing and tired of not being accepted due to his lifestyle. Patient is positive for passive SI at the time of assessment but does mention thoughts of jumping off a bridge. Patient denies HI. Patient denies AH but reports seeing a dark shadow following him. Patient is currently homeless and states he stays with various friends from time to time. Patient contracts for safety at this time and remains safe on the unit.

## 2020-10-18 NOTE — [facility] Suicide Risk Assessment (Addendum)
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 The direct patient care time included face-to-face time with the patient, reviewing the patient's chart, communicating with other professionals, and coordinating care. Greater than 50% of this time was spent in counseling or coordinating care with the patient regarding goals of hospitalization, psycho-education, and discharge planning needs.  Principal Problem: <principal problem not specified> Diagnosis:  Active Problems:   MDD (major depressive disorder), recurrent episode, severe (HCC)  Subjective Data: Patient is a 40y/o male with h/o bipolar d/o who was admitted for suicidal ideation with plan to jump off a bridge in the context of worsening depression. He states he has not been taking his HIV medications or his psychotropic medications for several months due to lack of self-care and general desire to die. He states he has been using crack cocaine and alcohol to self-medicate. He estimates that he has been drinking 3-4 40 oz beers daily and using 1 gram of crack cocaine daily for the last several months. He previously was in 4 rehab programs for substance abuse and has a h/o 2 previous DUIs. He reports that for the last 6-7 months he has been depressed and reports associated symptoms of poor focus, low self-esteem, insomnia, low motivation, fluctuating appetite, anhedonia,  and low energy. He states that his psychosocial stressors include living with a male friend who expects him to bring her drugs, cigarettes, or money in exchange for living with her. 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 He states he is "happier when I live as male" but he does not wish to undergo gender reassignment. He is homosexual and has felt bullied and harassed based on his sexual identity. He states that he has a h/o hypomanic episodes lasting several days at a time where he is more talkative, has racing thoughts, is mood elevated, is impulsive, takes more risks, spends money, and does not need to sleep. He knows he previously was on Abilify and Latuda to manage his bipolar symptoms but does not recall other mood stabilizer trials. He has not previously been on antidepressants. He endorses a h/o previous suicide attempts. He reports 1 previous episode of VH (seeing a dark shadow) approximately 1 week ago. He denies other h/o paranoia, AH, delusions, or ideas of reference.   Continued Clinical Symptoms:  Alcohol Use Disorder Identification Test Final Score (AUDIT): 9 The "Alcohol Use Disorders Identification Test", Guidelines for Use in Primary Care, Second Edition.  World Science writer [facility]). Score between 0-7:  no or low risk or alcohol related problems. Score between 8-15:  moderate risk of alcohol related problems. Score between 16-19:  high risk of alcohol related problems. Score 20 or above:  warrants further diagnostic evaluation for alcohol dependence and treatment.  CLINICAL FACTORS:   Bipolar Disorder:   Bipolar II Depressive phase Alcohol/Substance Abuse/Dependencies Medical Diagnoses and Treatments/Surgeries  Musculoskeletal: Strength & Muscle Tone: within normal limits Gait & Station: normal Patient leans: N/A  Psychiatric Specialty Exam: Physical Exam Vitals reviewed.   HENT:     Head: Normocephalic.  Pulmonary:     Effort: Pulmonary effort is normal.  Neurological:     Mental Status: He is alert.     Review of Systems  Constitutional: Positive for appetite change and fatigue.  Genitourinary: Positive for genital sores.  Psychiatric/Behavioral: Positive for dysphoric mood, sleep disturbance and suicidal ideas.    Blood pressure (!) 121/94, pulse 86, temperature (!) 97.5 F (36.4 C), temperature source Oral, resp. rate 17, height 6\' 1"  (1.854 m), weight 88.5 kg, SpO2 100 %.Body mass index is 25.73 kg/m.  General Appearance: fair hygiene, appears stated age  Eye Contact:  Fair  Speech:  Clear and Coherent and Normal Rate  Volume:  Decreased  Mood:  Dysphoric  Affect:  Constricted  Thought Process:  circumstantial  Orientation:  Full (Time, Place, and Person)  Thought Content:  Denies current AVH but reports recent VH of seeing a shadow, delusions, or paranoia; no obsessions/compulsions, delusions, or active psychosis noted on exam  Suicidal Thoughts:  Reports SI prior to admission with plan to jump off a bridge; ambivalent about SI at this time  Homicidal Thoughts:  Denied  Memory:  Recent;   Fair  Judgement:  Fair  Insight:  Fair  Psychomotor Activity:  Normal  Concentration:  Concentration: Fair and Attention Span: Fair  Recall:  of Knowledge:  Good  Language:  Good  Akathisia:  Negative  Assets:  Communication Skills Desire for Improvement Resilience  ADL's:  Intact  Cognition:  WNL  Sleep:  Number of Hours: 4.75   COGNITIVE FEATURES THAT CONTRIBUTE TO RISK:  None    SUICIDE RISK:   Moderate:  Frequent suicidal ideation with limited intensity, and duration, some specificity in terms of plans, no associated intent, good self-control, limited dysphoria/symptomatology, some risk factors present, and identifiable protective factors, including available and accessible social support.  PLAN OF CARE: [employer] will contact  patient's ID provider for recommendations about safety in continuing his HIV medications in the context of his recent medication noncompliance. He has been restarted on Abilify 10mg  and will consider start of an antidepressant once confirming with ID about his HIV medication regimen recommendations to avoid drug-drug interactions. He has been restarted on Buspar 15mg  tid and home BP medication. He will be placed on CIWA monitoring and oral thiamine and MVI replacement. He is to receive topical PRN lidocaine along with PRN tylenol and motrin for pain control of his genital wart pain. Admission labs were reviewed: CMP WNL except for Na+ 134, Cl 97, Ca+ 8.5, Albumin 2.9; lipid panel WNL except for HDL 25 and triglycerides 164; WBC 8.6, H/H 15/43.2 platelets 267; tylenol <10, salicylate <7, TSH 2.043, CD4 T cells 445 and CD4% Helper T cell 18; respiratory panel negative; alcohol 23, UDS positive for cocaine; EKG shows NSR QTc 441. Will recheck CMP tomorrow and he has HbgA1c pending. Social work to explore options for residential rehab after discharge for substance abuse treatment.   I certify that inpatient services furnished can reasonably be expected to improve the patient's condition.   , MD, FAPA 10/18/2020, 1:16 PM
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
## 2020-10-18 NOTE — Tx Team (Signed)
Initial Treatment Plan 10/18/2020 4:07 AM [facility] [id number]    PATIENT STRESSORS: Financial difficulties Health problems Substance abuse   PATIENT STRENGTHS: Ability for insight Communication skills General fund of knowledge   PATIENT IDENTIFIED PROBLEMS: Suicidal Ideation   (overcoming hopelessness and talking through issues)                   DISCHARGE CRITERIA:  Ability to meet basic life and health needs Adequate post-discharge living arrangements Improved stabilization in mood, thinking, and/or behavior Verbal commitment to aftercare and medication compliance  PRELIMINARY DISCHARGE PLAN: Attend 12-step recovery group Outpatient therapy  PATIENT/FAMILY INVOLVEMENT: This treatment plan has been presented to and reviewed with the patient, Vane Yapp, and/or family member.  The patient and family have been given the opportunity to ask questions and make suggestions.  Mancel Bale, RN 10/18/2020, 4:07 AM

## 2020-10-19 DIAGNOSIS — F332 Major depressive disorder, recurrent severe without psychotic features: Secondary | ICD-10-CM | POA: Diagnosis not present

## 2020-10-19 LAB — COMPREHENSIVE METABOLIC PANEL
ALT: 17 U/L (ref 0–44)
AST: 17 U/L (ref 15–41)
Albumin: 2.9 g/dL — ABNORMAL LOW (ref 3.5–5.0)
Alkaline Phosphatase: 61 U/L (ref 38–126)
Anion gap: 5 (ref 5–15)
BUN: 9 mg/dL (ref 6–20)
CO2: 29 mmol/L (ref 22–32)
Calcium: 8.6 mg/dL — ABNORMAL LOW (ref 8.9–10.3)
Chloride: 105 mmol/L (ref 98–111)
Creatinine, Ser: 0.91 mg/dL (ref 0.61–1.24)
GFR, Estimated: >60 mL/min (ref >60)
Glucose, Bld: 139 mg/dL — ABNORMAL HIGH (ref 70–99)
Potassium: 3.9 mmol/L (ref 3.5–5.1)
Sodium: 139 mmol/L (ref 135–145)
Total Bilirubin: 0.5 mg/dL (ref 0.3–1.2)
Total Protein: 7.8 g/dL (ref 6.5–8.1)

## 2020-10-19 LAB — HEMOGLOBIN A1C
Hgb A1c MFr Bld: 5.4 % (ref 4.8–5.6)
Mean Plasma Glucose: 108 mg/dL

## 2020-10-19 NOTE — Plan of Care (Addendum)
Patient stayed in his room in bed all day.  Patient stated the lidocaine has decreased his pain.  Marland Kitchen

## 2020-10-19 NOTE — Tx Team (Signed)
Interdisciplinary Treatment and Diagnostic Plan Update  10/19/2020 Time of Session: 9:25am Henry [name] MRN: [id number]  Principal Diagnosis: Bipolar 2 disorder, major depressive episode ([name])  Secondary Diagnoses: Principal Problem:   Bipolar 2 disorder, major depressive episode ([name]) Active Problems:   HIV disease (Henry [name])   Tobacco use disorder   Cocaine abuse ([place])   ETOH abuse   Current Medications:  Current Facility-Administered Medications  Medication Dose Route Frequency Provider Last Rate Last Admin  . acetaminophen (TYLENOL) tablet 650 mg  650 mg Oral Q6H PRN Connye Burkitt, NP      . alum & mag hydroxide-simeth (MAALOX/MYLANTA) 200-200-20 MG/5ML suspension 30 mL  30 mL Oral Q4H PRN Connye Burkitt, NP      . ARIPiprazole (ABILIFY) tablet 10 mg  10 mg Oral Daily Connye Burkitt, NP   10 mg at 10/19/20 0851  . bictegravir-emtricitabine-tenofovir AF (BIKTARVY) 50-200-25 MG per tablet 1 tablet  1 tablet Oral Daily Connye Burkitt, NP   1 tablet at 10/19/20 0851  . busPIRone (BUSPAR) tablet 15 mg  15 mg Oral TID Connye Burkitt, NP   15 mg at 10/19/20 0851  . hydrOXYzine (ATARAX/VISTARIL) tablet 25 mg  25 mg Oral Q6H PRN Connye Burkitt, NP   25 mg at 10/19/20 0854  . ibuprofen (ADVIL) tablet 600 mg  600 mg Oral Q6H PRN Prescilla Sours, PA-C   600 mg at 10/19/20 0854  . lidocaine (XYLOCAINE) 5 % ointment   Topical TID PRN Prescilla Sours, PA-C   1 application at 01/65/53 0855  . loperamide (IMODIUM) capsule 2-4 mg  2-4 mg Oral PRN Connye Burkitt, NP      . LORazepam (ATIVAN) tablet 1 mg  1 mg Oral Q6H PRN Connye Burkitt, NP      . magnesium hydroxide (MILK OF MAGNESIA) suspension 30 mL  30 mL Oral Daily PRN Connye Burkitt, NP      . metoprolol succinate (TOPROL-XL) 24 hr tablet 100 mg  100 mg Oral Daily Connye Burkitt, NP   100 mg at 10/19/20 0851  . mirtazapine (REMERON) tablet 15 mg  15 mg Oral QHS [name], MD   15 mg at 10/18/20 2119  . multivitamin with minerals tablet 1 tablet   1 tablet Oral Daily Connye Burkitt, NP   1 tablet at 10/18/20 1740  . ondansetron (ZOFRAN-ODT) disintegrating tablet 4 mg  4 mg Oral Q6H PRN Connye Burkitt, NP      . thiamine tablet 100 mg  100 mg Oral Daily Connye Burkitt, NP   100 mg at 10/18/20 1740   PTA Medications: Medications Prior to Admission  Medication Sig Dispense Refill Last Dose  . ARIPiprazole (ABILIFY) 10 MG tablet Take 1 tablet (10 mg total) by mouth daily. (Patient not taking: Reported on 10/16/2020) 30 tablet 2   . bictegravir-emtricitabine-tenofovir AF (BIKTARVY) 50-200-25 MG TABS tablet Restart med as directed by DR [name] after appt ([phone]) 30 tablet 0   . busPIRone (BUSPAR) 15 MG tablet Take 1 tablet (15 mg total) by mouth 3 (three) times daily. (Patient not taking: Reported on 10/16/2020) 90 tablet 2   . ibuprofen (ADVIL) 200 MG tablet Take 800 mg by mouth every 6 (six) hours as needed for mild pain or headache.     . Melatonin 3 MG CAPS Take 6 mg by mouth at bedtime as needed for sleep.     . metoprolol succinate (TOPROL XL) 100 MG  24 hr tablet Take 1 tablet (100 mg total) by mouth daily. Take with or immediately following a meal. 30 tablet 11   . nicotine (NICODERM CQ - DOSED IN MG/24 HOURS) 21 mg/24hr patch Place 21 mg onto the skin daily.       Patient Stressors: Financial difficulties Health problems Substance abuse  Patient Strengths: Ability for Engineer, manufacturing fund of knowledge  Treatment Modalities: Medication Management, Group therapy, Case management,  1 to 1 session with clinician, Psychoeducation, Recreational therapy.   Physician Treatment Plan for Primary Diagnosis: Bipolar 2 disorder, major depressive episode ([place]) Long Term Goal(s): Improvement in symptoms so as ready for discharge Improvement in symptoms so as ready for discharge   Short Term Goals: Ability to identify changes in lifestyle to reduce recurrence of condition will improve Ability to verbalize feelings  will improve Ability to disclose and discuss suicidal ideas Ability to demonstrate self-control will improve Ability to identify and develop effective coping behaviors will improve Ability to maintain clinical measurements within normal limits will improve Compliance with prescribed medications will improve Ability to identify triggers associated with substance abuse/mental health issues will improve Ability to identify changes in lifestyle to reduce recurrence of condition will improve Ability to verbalize feelings will improve Ability to disclose and discuss suicidal ideas Ability to demonstrate self-control will improve Ability to identify and develop effective coping behaviors will improve Ability to maintain clinical measurements within normal limits will improve Compliance with prescribed medications will improve Ability to identify triggers associated with substance abuse/mental health issues will improve  Medication Management: Evaluate patient's response, side effects, and tolerance of medication regimen.  Therapeutic Interventions: 1 to 1 sessions, Unit Group sessions and Medication administration.  Evaluation of Outcomes: Not Met  Physician Treatment Plan for Secondary Diagnosis: Principal Problem:   Bipolar 2 disorder, major depressive episode ([place]) Active Problems:   HIV disease ([place])   Tobacco use disorder   Cocaine abuse ([place])   ETOH abuse  Long Term Goal(s): Improvement in symptoms so as ready for discharge Improvement in symptoms so as ready for discharge   Short Term Goals: Ability to identify changes in lifestyle to reduce recurrence of condition will improve Ability to verbalize feelings will improve Ability to disclose and discuss suicidal ideas Ability to demonstrate self-control will improve Ability to identify and develop effective coping behaviors will improve Ability to maintain clinical measurements within normal limits will improve Compliance with  prescribed medications will improve Ability to identify triggers associated with substance abuse/mental health issues will improve Ability to identify changes in lifestyle to reduce recurrence of condition will improve Ability to verbalize feelings will improve Ability to disclose and discuss suicidal ideas Ability to demonstrate self-control will improve Ability to identify and develop effective coping behaviors will improve Ability to maintain clinical measurements within normal limits will improve Compliance with prescribed medications will improve Ability to identify triggers associated with substance abuse/mental health issues will improve     Medication Management: Evaluate patient's response, side effects, and tolerance of medication regimen.  Therapeutic Interventions: 1 to 1 sessions, Unit Group sessions and Medication administration.  Evaluation of Outcomes: Not Met   RN Treatment Plan for Primary Diagnosis: Bipolar 2 disorder, major depressive episode ([place]) Long Term Goal(s): Knowledge of disease and therapeutic regimen to maintain health will improve  Short Term Goals: Ability to verbalize feelings will improve, Ability to disclose and discuss suicidal ideas and Ability to identify and develop effective coping behaviors will improve  Medication Management:  RN will administer medications as ordered by provider, will assess and evaluate patient's response and provide education to patient for prescribed medication. RN will report any adverse and/or side effects to prescribing provider.  Therapeutic Interventions: 1 on 1 counseling sessions, Psychoeducation, Medication administration, Evaluate responses to treatment, Monitor vital signs and CBGs as ordered, Perform/monitor CIWA, COWS, AIMS and Fall Risk screenings as ordered, Perform wound care treatments as ordered.  Evaluation of Outcomes: Not Met   LCSW Treatment Plan for Primary Diagnosis: Bipolar 2 disorder, major  depressive episode ([place]) Long Term Goal(s): Safe transition to appropriate next level of care at discharge, Engage patient in therapeutic group addressing interpersonal concerns.  Short Term Goals: Engage patient in aftercare planning with referrals and resources, Facilitate patient progression through stages of change regarding substance use diagnoses and concerns and Identify triggers associated with mental health/substance abuse issues  Therapeutic Interventions: Assess for all discharge needs, 1 to 1 time with Social worker, Explore available resources and support systems, Assess for adequacy in community support network, Educate family and significant other(s) on suicide prevention, Complete Psychosocial Assessment, Interpersonal group therapy.  Evaluation of Outcomes: Not Met   Progress in Treatment: Attending groups: No. and As evidenced by:  Pt was recently admitted Participating in groups: No. and As evidenced by:  Pt was recently admitted Taking medication as prescribed: Yes. Toleration medication: Yes. Family/Significant other contact made: No, will contact:  Pt declined consents Patient understands diagnosis: Yes. Discussing patient identified problems/goals with staff: Yes. Medical problems stabilized or resolved: Yes. Denies suicidal/homicidal ideation: Yes. Issues/concerns per patient self-inventory: No. Other: None  New problem(s) identified: No, Describe:  CSW will continue to assess  New Short Term/Long Term Goal(s):medication stabilization, elimination of SI thoughts, development of comprehensive mental wellness plan.  Patient Goals:   "be less stressed and more motivated"  Discharge Plan or Barriers: Patient recently admitted. CSW will continue to follow and assess for appropriate referrals and possible discharge planning.  Reason for Continuation of Hospitalization: Medication stabilization Suicidal ideation Withdrawal symptoms  Estimated Length of Stay: 3-5  days  Attendees: Patient: Henry [name] 10/19/2020   Physician: Dr. Claris Gower 10/19/2020   Nursing:  10/19/2020   RN Care Manager: 10/19/2020   Social Worker: Toney Reil, Latanya Presser 10/19/2020   Recreational Therapist:  10/19/2020   Other:  10/19/2020   Other:  10/19/2020   Other: 10/19/2020       Scribe for Treatment Team: Mliss Fritz, Latanya Presser 10/19/2020 10:49 AM

## 2020-10-19 NOTE — Progress Notes (Signed)
Psychoeducational Group Note  Date:  10/19/2020 Time:  2030 Group Topic/Focus:  wrap up group  Participation Level: Did Not Attend  Participation Quality:  Not Applicable  Affect:  Not Applicable  Cognitive:  Not Applicable  Insight:  Not Applicable  Engagement in Group: Not Applicable  Additional Comments:  Pt remained in bed during group time.   Marcille Buffy 10/19/2020, 8:45 PM

## 2020-10-19 NOTE — Progress Notes (Signed)
Patient remains sad and isolative to his room. He denies SI/HI& AVH. He still endorses anxiety and depression. He was complaint with medications. He had minimal interactions with peers and staff on shift. He appears to be in bed resting quietly at this time.

## 2020-10-19 NOTE — Progress Notes (Signed)
Recreation Therapy Notes  Date:  2.16.22 Time: 0930 Location: 300 [address] Dayroom  Group Topic: Stress Management  Goal Area(s) Addresses:  Patient will identify positive stress management techniques. Patient will identify benefits of using stress management post d/c.  Intervention: Stress Management  Activity: Forgiveness of Self and Others Meditation.  LRT played a meditation that focused on finding the empathy within themselves to forgive themselves and others for any let downs, hurt or misunderstandings.  Patients were to listen and follow along with the meditation as it played.    Education:  Stress Management, Discharge Planning.   Education Outcome: Acknowledges Education  Clinical Observations/Feedback: Pt did not attend group session.     Caroll Rancher, LRT/CTRS         Caroll Rancher A 10/19/2020 11:14 AM

## 2020-10-19 NOTE — Progress Notes (Addendum)
[facility]  10/19/2020 11:25 AM Henry [name]  MRN:  [id number] Subjective:  This AM patient reports that he is feeling "better" than yesterday emotionally. Patient reports that he is eating well, but is sleep is disturbed by the pain he is having from his perianal warts. Patient reports that the pain is very debilitating, but he will try to spend some time up on the unit today, interacting with others and try to attend some groups for as long as he is able to sit. Patient reports that he feels his current medications are appropriate and he endorses that he does plan to continue them at discharge. Patient is no longer endorsing SI and reports that he will continue his Biktarvy OP. Patient does not endorse HI nor AVH.  Principal Problem: Bipolar 2 disorder, major depressive episode ([facility]) Diagnosis: Principal Problem:   Bipolar 2 disorder, major depressive episode ([facility]) Active Problems:   HIV disease ([facility])   Tobacco use disorder   Cocaine abuse ([facility])   ETOH abuse  Total Time spent with patient: 15 minutes  Past Psychiatric History: See H&P  Past Medical History:  Past Medical History:  Diagnosis Date  . Anxiety   . Bipolar 1 disorder ([facility])   . HIV (human immunodeficiency virus infection) ([facility])    History reviewed. No pertinent surgical history. Family History:  Family History  Problem Relation Age of Onset  . Thyroid disease Mother   . Heart disease Maternal Grandmother   . Breast cancer Maternal Grandmother    Family Psychiatric  History: See H&P Social History:  Social History   Substance and Sexual Activity  Alcohol Use Yes  . Alcohol/week: 0.0 standard drinks   Comment: 2 a day off and on     Social History   Substance and Sexual Activity  Drug Use Yes  . Types: Marijuana, Cocaine, Amphetamines, Benzodiazepines, "Crack" cocaine    Social History   Socioeconomic History  . Marital status: Single    Spouse name: Not on file  . Number of children: Not on file   . Years of education: Not on file  . Highest education level: Not on file  Occupational History  . Not on file  Tobacco Use  . Smoking status: Current Every Day Smoker    Packs/day: 1.00    Years: 14.00    Pack years: 14.00    Types: Cigarettes  . Smokeless tobacco: Never Used  . Tobacco comment: trying to cut back  Vaping Use  . Vaping Use: Never used  Substance and Sexual Activity  . Alcohol use: Yes    Alcohol/week: 0.0 standard drinks    Comment: 2 a day off and on  . Drug use: Yes    Types: Marijuana, Cocaine, Amphetamines, Benzodiazepines, "Crack" cocaine  . Sexual activity: Yes    Partners: Male    Birth control/protection: None, Condom  Other Topics Concern  . Not on file  Social History Narrative  . Not on file   Social Determinants of Health   Financial Resource Strain: Not on file  Food Insecurity: Not on file  Transportation Needs: Not on file  Physical Activity: Not on file  Stress: Not on file  Social Connections: Not on file   Additional Social History:                         Sleep: Poor  Appetite:  Good  Current Medications: Current Facility-Administered Medications  Medication Dose Route Frequency Provider  Last Rate Last Admin  . acetaminophen (TYLENOL) tablet 650 mg  650 mg Oral Q6H PRN Aldean Baker, NP      . alum & mag hydroxide-simeth (MAALOX/MYLANTA) 200-200-20 MG/5ML suspension 30 mL  30 mL Oral Q4H PRN Aldean Baker, NP      . ARIPiprazole (ABILIFY) tablet 10 mg  10 mg Oral Daily Aldean Baker, NP   10 mg at 10/19/20 0851  . bictegravir-emtricitabine-tenofovir AF (BIKTARVY) 50-200-25 MG per tablet 1 tablet  1 tablet Oral Daily Aldean Baker, NP   1 tablet at 10/19/20 0851  . busPIRone (BUSPAR) tablet 15 mg  15 mg Oral TID Aldean Baker, NP   15 mg at 10/19/20 0851  . hydrOXYzine (ATARAX/VISTARIL) tablet 25 mg  25 mg Oral Q6H PRN Aldean Baker, NP   25 mg at 10/19/20 0854  . ibuprofen (ADVIL) tablet 600 mg  600 mg Oral  Q6H PRN Jaclyn Shaggy, PA-C   600 mg at 10/19/20 0854  . lidocaine (XYLOCAINE) 5 % ointment   Topical TID PRN Jaclyn Shaggy, PA-C   1 application at 10/19/20 0855  . loperamide (IMODIUM) capsule 2-4 mg  2-4 mg Oral PRN Aldean Baker, NP      . LORazepam (ATIVAN) tablet 1 mg  1 mg Oral Q6H PRN Aldean Baker, NP      . magnesium hydroxide (MILK OF MAGNESIA) suspension 30 mL  30 mL Oral Daily PRN Aldean Baker, NP      . metoprolol succinate (TOPROL-XL) 24 hr tablet 100 mg  100 mg Oral Daily Aldean Baker, NP   100 mg at 10/19/20 0851  . mirtazapine (REMERON) tablet 15 mg  15 mg Oral QHS Eliseo Gum B, MD   15 mg at 10/18/20 2119  . multivitamin with minerals tablet 1 tablet  1 tablet Oral Daily Aldean Baker, NP   1 tablet at 10/18/20 1740  . ondansetron (ZOFRAN-ODT) disintegrating tablet 4 mg  4 mg Oral Q6H PRN Aldean Baker, NP      . thiamine tablet 100 mg  100 mg Oral Daily Aldean Baker, NP   100 mg at 10/18/20 1740    Lab Results:  Results for orders placed or performed during the hospital encounter of 10/17/20 (from the past 48 hour(s))  TSH     Status: None   Collection Time: 10/18/20  6:49 AM  Result Value Ref Range   TSH 2.043 0.350 - 4.500 uIU/mL    Comment: Performed by a 3rd Generation assay with a functional sensitivity of <=0.01 uIU/mL. Performed at [facility], [address], [place], [place] [address]   Hemoglobin A1c     Status: None   Collection Time: 10/18/20  6:49 AM  Result Value Ref Range   Hgb A1c MFr Bld 5.4 4.8 - 5.6 %    Comment: (NOTE)         Prediabetes: 5.7 - 6.4         Diabetes: >6.4         Glycemic control for adults with diabetes: <7.0    Mean Plasma Glucose 108 mg/dL    Comment: (NOTE) Performed At: [facility] [address] [place], [place] [id number] Jolene Schimke MD OX:[id number]   Lipid panel     Status: Abnormal   Collection Time: 10/18/20  6:49 AM  Result Value Ref Range   Cholesterol 121 0 -  200 mg/dL   Triglycerides 683 (  H) <150 mg/dL   HDL 25 (L) >86 mg/dL   Total CHOL/HDL Ratio 4.8 RATIO   VLDL 33 0 - 40 mg/dL   LDL Cholesterol 63 0 - 99 mg/dL    Comment:        Total Cholesterol/HDL:CHD Risk Coronary Heart Disease Risk Table                     Men   Women  1/2 Average Risk   3.4   3.3  Average Risk       5.0   4.4  2 X Average Risk   9.6   7.1  3 X Average Risk  23.4   11.0        Use the calculated Patient Ratio above and the CHD Risk Table to determine the patient's CHD Risk.        ATP III CLASSIFICATION (LDL):  <100     mg/dL   Optimal  578-469  mg/dL   Near or Above                    Optimal  130-159  mg/dL   Borderline  629-528  mg/dL   High  >413     mg/dL   Very High Performed at [facility], [address], [place], [place] [address]   Comprehensive metabolic panel     Status: Abnormal   Collection Time: 10/19/20  6:58 AM  Result Value Ref Range   Sodium 139 135 - 145 mmol/L   Potassium 3.9 3.5 - 5.1 mmol/L   Chloride 105 98 - 111 mmol/L   CO2 29 22 - 32 mmol/L   Glucose, Bld 139 (H) 70 - 99 mg/dL    Comment: Glucose reference range applies only to samples taken after fasting for at least 8 hours.   BUN 9 6 - 20 mg/dL   Creatinine, Ser 0.27 0.61 - 1.24 mg/dL   Calcium 8.6 (L) 8.9 - 10.3 mg/dL   Total Protein 7.8 6.5 - 8.1 g/dL   Albumin 2.9 (L) 3.5 - 5.0 g/dL   AST 17 15 - 41 U/L   ALT 17 0 - 44 U/L   Alkaline Phosphatase 61 38 - 126 U/L   Total Bilirubin 0.5 0.3 - 1.2 mg/dL   GFR, Estimated >25 >36 mL/min    Comment: (NOTE) Calculated using the CKD-EPI Creatinine Equation (2021)    Anion gap 5 5 - 15    Comment: Performed at [facility], [address] [place], [place] [address]    Blood Alcohol level:  Lab Results  Component Value Date   ETH 23 (H) 10/16/2020   ETH <10 09/05/2018    Metabolic Disorder Labs: Lab Results  Component Value Date   HGBA1C 5.4 10/18/2020   MPG 108  10/18/2020   No results found for: PROLACTIN Lab Results  Component Value Date   CHOL 121 10/18/2020   TRIG 164 (H) 10/18/2020   HDL 25 (L) 10/18/2020   CHOLHDL 4.8 10/18/2020   VLDL 33 10/18/2020   LDLCALC 63 10/18/2020   LDLCALC 64 04/06/2019    Physical Findings: AIMS:  , ,  ,  ,    CIWA:  CIWA-Ar Total: 0 COWS:     Musculoskeletal: Strength & Muscle Tone: within normal limits Gait & Station: remains in bed on exam Patient leans: N/A  Psychiatric Specialty Exam: Physical Exam Constitutional:      Appearance: Normal appearance.  HENT:  Head: Normocephalic and atraumatic.  Pulmonary:     Effort: Pulmonary effort is normal.  Neurological:     Mental Status: He is alert.     Review of Systems  Cardiovascular: Negative for chest pain.  Gastrointestinal: Negative for abdominal pain.  Genitourinary:       Pain in the perianal area    Blood pressure (!) 124/97, pulse 94, temperature (!) 97.5 F (36.4 C), temperature source Oral, resp. rate 17, height 6\' 1"  (1.854 m), weight 88.5 kg, SpO2 97 %.Body mass index is 25.73 kg/m.  General Appearance: Casual  Eye Contact:  Fair  Speech:  Clear and Coherent  Volume:  Normal  Mood:  Euthymic  Affect:  Congruent  Thought Process:  Coherent  Orientation:  NA  Thought Content:  Logical  Suicidal Thoughts:  No  Homicidal Thoughts:  No  Memory:  Recent;   Fair  Judgement:  Other:  Improving  Insight:  Shallow  Psychomotor Activity:  Psychomotor Retardation  Concentration:  Concentration: Fair  Recall:  NA  Fund of Knowledge:  Fair  Language:  Good  Akathisia:  No    AIMS (if indicated):     Assets:  Desire for Improvement, Resilience  ADL's:  Intact  Cognition:  WNL  Sleep:  Number of Hours: 4.75     Treatment Plan Summary: Daily contact with patient to assess and evaluate symptoms and progress in treatment Mr. Traynor is a 40 yo patient with a hx of Bipolar 1 disorder, EtOH use disorder, Cocaine use disorder,  and GAD.Patient endorses feeling a little bit better today and reports that he will try to decrease some of his self-isolating behaviors. Patient's perianal warts are somewhat a barrier to his participation as it make it very uncomfortable for him to sit or walk. Patient is showing some improvement however, but endorsing that despite this he is willing to try. Patient CMP showed Na of 139. Patient Albumin is low but consistent at 2.9 from last CMP. Will continue to promote patient eating and drinking.  Bipolar II disorder, current episode depressed Insomnia - Abilify 10mg   - Buspar 15mg  TID - Remeron 15mg  QHS - EKG shows QTc 441 - Hgb A1c 5.4  HIV - Restart Biktarvy per ID recommendations  HTN - Continue home Toprol- XL 100mg    EtOH use disorder - Thiamine - MV -  Ativan Detox Protocol  Perianal warts Likely Condyloma acuminata. Patient endorses that these warts are causing him significant pain.  - Lidocaine 5% ointment  - SW PCP f/u - ID f/u  PRN -Tylenol 650mg  q6h, pain -Maalox 84ml q4h, indigestion -Atarax 25mg  TID, anxiety -Milk of Mag 62mL, constipation  PGY-1 , MD 10/19/2020, 11:25 AM

## 2020-10-19 NOTE — [facility] Group Notes (Signed)
[facility] LCSW Group Therapy  10/19/2020 2:06 PM  Type of Therapy:  Reflection and self-care   Participation Level:  Did Not Attend  Participation Quality:  Did not attend  Affect:  Did not attend  Cognitive:  Did not attend  Insight:  Did not attend  Engagement in Therapy:  Did not attend  Modes of Intervention:  Activity  Summary of Progress/Problems: Pt did not attend the group   Aram Beecham 10/19/2020, 2:06 PM

## 2020-10-19 NOTE — Plan of Care (Signed)
Nurse discussed anxiety, depression and coping skills with patient.  

## 2020-10-19 NOTE — Progress Notes (Signed)
D:  Patient's self inventory sheet, patient has poor sleep, sleep medication not helpful.  Poor appetite, low energy level, poor concentration.  Rated depression, hopeless and anxiety 10.  Withdrawals, cravings, agitation, irritability.  Denied SI.  Physical problems, pain, dizzy, rash.  Physical pain, worst pain #10 in past 24 hours.  Pain medicine is helpful.  Goal is feel better.  No questions.  Denied discharge plans. A:  Medications administered per MD orders.  Emotional support and encouragement given patient. R:  Denied SI and HI, contracts for safety.  Denied A/V hallucinations.  Safety maintained with 15 minute checks.

## 2020-10-20 DIAGNOSIS — F332 Major depressive disorder, recurrent severe without psychotic features: Secondary | ICD-10-CM | POA: Diagnosis not present

## 2020-10-20 MED ORDER — IMIQUIMOD 5 % EX CREA
TOPICAL_CREAM | CUTANEOUS | Status: DC
  Filled 2020-10-20 (×6): qty 1

## 2020-10-20 MED ORDER — OXYMETAZOLINE HCL 0.05 % NA SOLN
1.0000 | Freq: Two times a day (BID) | NASAL | Status: DC
  Filled 2020-10-20: qty 15

## 2020-10-20 MED ORDER — NICOTINE 21 MG/24HR TD PT24
21.0000 mg | MEDICATED_PATCH | Freq: Every day | TRANSDERMAL | Status: DC
  Administered 2020-10-20 – 2020-10-25 (×5): 21 mg via TRANSDERMAL
  Filled 2020-10-20 (×8): qty 1
  Filled 2020-10-20: qty 14
  Filled 2020-10-20: qty 1

## 2020-10-20 MED ORDER — MENTHOL 3 MG MT LOZG
1.0000 | LOZENGE | OROMUCOSAL | Status: DC | PRN
  Administered 2020-10-21 – 2020-10-22 (×2): 3 mg via ORAL
  Filled 2020-10-20: qty 27
  Filled 2020-10-20: qty 9

## 2020-10-20 NOTE — Progress Notes (Signed)
The patient rated his day as a 5 out of 10 since this is the first day that he is feeling well. He also states that he had a good telephone call with both his mother and sister today. His goal for tomorrow is to work on mending some of his relationships with his family.

## 2020-10-20 NOTE — Progress Notes (Addendum)
   10/20/20 1424  Vital Signs  Temp 97.8 F (36.6 C)  Temp Source Oral  Pulse Rate 86  Pulse Rate Source Dinamap  Resp 16  BP 124/89  BP Location Left Arm  BP Method Automatic  Patient Position (if appropriate) Sitting   D: Patient reported some SI, but did not have a plan. Pt. Denies AVH and HI. Patient rated anxiety 8/10 and depression 9/10. Pt. Rated  pain in the groin/ peri area d/t genital warts 10/10.Patient was out in open areas and was social with staff. Patient dressed in street clothes,and  had good eye contact. A:  Patient took scheduled medicine. Pt. Took lidocaine topical gel for peri/ groin pain and 650 mg of Tylenol which relieved his pain. Pt . Took 25 mg of vistaril for anxiety , which relieved his anxiety. Pt. Took 600 mg of Advil for groin/peri pain in the late afternoon.  Support and encouragement provided Routine safety checks conducted every 15 minutes. Patient  Informed to notify staff with any concerns.  Safety maintained.

## 2020-10-20 NOTE — Progress Notes (Signed)
Patient remains sad and depressed. He mostly isolates to room and has minimal interaction with peers. He denies AVH &SI. He was medication compliant. He appears to be in the bed resting quietly at this time.

## 2020-10-20 NOTE — Progress Notes (Addendum)
[facility] MD Progress Note  10/20/2020 3:17 PM [place]  MRN:  [id number] Subjective: The patient presents outside of his bed this AM and is up showered and dress. Patient reports that he is feeling better this AM and he spoke with his mother yesterday for the first time in a long time. Patient reports that the conversation went really well. Patient also reports that today he wants to put forth more effort to be out of his room and get the most he can out of his hospitalization. Patient was very happy to learn that he has been accepted into 2 rehab programs and reported that he would do anything that was required to make the transition faster. Patient otherwise reported that he is eating and did not endorse SI, HI, nor AVH.   Principal Problem: Bipolar 2 disorder, major depressive episode (HCC) Diagnosis: Principal Problem:   Bipolar 2 disorder, major depressive episode (HCC) Active Problems:   HIV disease (HCC)   Tobacco use disorder   Cocaine abuse (HCC)   ETOH abuse  Total Time spent with patient: 15 minutes  Past Psychiatric History: See H&P  Past Medical History:  Past Medical History:  Diagnosis Date  . Anxiety   . Bipolar 1 disorder (HCC)   . HIV (human immunodeficiency virus infection) (HCC)    History reviewed. No pertinent surgical history. Family History:  Family History  Problem Relation Age of Onset  . Thyroid disease Mother   . Heart disease Maternal Grandmother   . Breast cancer Maternal Grandmother    Family Psychiatric  History: See H&P Social History:  Social History   Substance and Sexual Activity  Alcohol Use Yes  . Alcohol/week: 0.0 standard drinks   Comment: 2 a day off and on     Social History   Substance and Sexual Activity  Drug Use Yes  . Types: Marijuana, Cocaine, Amphetamines, Benzodiazepines, "Crack" cocaine    Social History   Socioeconomic History  . Marital status: Single    Spouse name: Not on file  . Number of children: Not on file   . Years of education: Not on file  . Highest education level: Not on file  Occupational History  . Not on file  Tobacco Use  . Smoking status: Current Every Day Smoker    Packs/day: 1.00    Years: 14.00    Pack years: 14.00    Types: Cigarettes  . Smokeless tobacco: Never Used  . Tobacco comment: trying to cut back  Vaping Use  . Vaping Use: Never used  Substance and Sexual Activity  . Alcohol use: Yes    Alcohol/week: 0.0 standard drinks    Comment: 2 a day off and on  . Drug use: Yes    Types: Marijuana, Cocaine, Amphetamines, Benzodiazepines, "Crack" cocaine  . Sexual activity: Yes    Partners: Male    Birth control/protection: None, Condom  Other Topics Concern  . Not on file  Social History Narrative  . Not on file   Social Determinants of Health   Financial Resource Strain: Not on file  Food Insecurity: Not on file  Transportation Needs: Not on file  Physical Activity: Not on file  Stress: Not on file  Social Connections: Not on file   Additional Social History:                         Sleep: Fair  Appetite:  Fair  Current Medications: Current Facility-Administered Medications  Medication Dose  Route Frequency Provider Last Rate Last Admin  . acetaminophen (TYLENOL) tablet 650 mg  650 mg Oral Q6H PRN Aldean BakerSykes, Janet E, NP   650 mg at 10/20/20 0816  . alum & mag hydroxide-simeth (MAALOX/MYLANTA) 200-200-20 MG/5ML suspension 30 mL  30 mL Oral Q4H PRN Aldean BakerSykes, Janet E, NP      . ARIPiprazole (ABILIFY) tablet 10 mg  10 mg Oral Daily Aldean BakerSykes, Janet E, NP   10 mg at 10/20/20 0811  . bictegravir-emtricitabine-tenofovir AF (BIKTARVY) 50-200-25 MG per tablet 1 tablet  1 tablet Oral Daily Aldean BakerSykes, Janet E, NP   1 tablet at 10/20/20 [id number]  . busPIRone (BUSPAR) tablet 15 mg  15 mg Oral TID Aldean BakerSykes, Janet E, NP   15 mg at 10/20/20 1259  . hydrOXYzine (ATARAX/VISTARIL) tablet 25 mg  25 mg Oral Q6H PRN Aldean BakerSykes, Janet E, NP   25 mg at 10/20/20 1431  . ibuprofen (ADVIL) tablet  600 mg  600 mg Oral Q6H PRN Jaclyn Shaggyaylor, Cody W, PA-C   600 mg at 10/19/20 2148  . [START ON 10/21/2020] imiquimod (ALDARA) 5 % cream   Topical Once per day on Mon Wed Fri Brigitt Mcclish E, MD      . lidocaine (XYLOCAINE) 5 % ointment   Topical TID PRN Jaclyn Shaggyaylor, Cody W, PA-C   Given at 10/20/20 [phone]  . loperamide (IMODIUM) capsule 2-4 mg  2-4 mg Oral PRN Aldean BakerSykes, Janet E, NP      . LORazepam (ATIVAN) tablet 1 mg  1 mg Oral Q6H PRN Aldean BakerSykes, Janet E, NP   1 mg at 10/19/20 1309  . magnesium hydroxide (MILK OF MAGNESIA) suspension 30 mL  30 mL Oral Daily PRN Aldean BakerSykes, Janet E, NP      . menthol-cetylpyridinium (CEPACOL) lozenge 3 mg  1 lozenge Oral PRN Mason JimSingleton, Hershy Flenner E, MD      . metoprolol succinate (TOPROL-XL) 24 hr tablet 100 mg  100 mg Oral Daily Aldean BakerSykes, Janet E, NP   100 mg at 10/20/20 0811  . mirtazapine (REMERON) tablet 15 mg  15 mg Oral QHS Eliseo GumMcQuilla, Jai B, MD   15 mg at 10/19/20 2148  . multivitamin with minerals tablet 1 tablet  1 tablet Oral Daily Aldean BakerSykes, Janet E, NP   1 tablet at 10/19/20 1800  . nicotine (NICODERM CQ - dosed in mg/24 hours) patch 21 mg  21 mg Transdermal Daily Armandina StammerNwoko, Agnes I, NP   21 mg at 10/20/20 1303  . ondansetron (ZOFRAN-ODT) disintegrating tablet 4 mg  4 mg Oral Q6H PRN Aldean BakerSykes, Janet E, NP      . thiamine tablet 100 mg  100 mg Oral Daily Aldean BakerSykes, Janet E, NP   100 mg at 10/19/20 1800    Lab Results:  Results for orders placed or performed during the hospital encounter of 10/17/20 (from the past 48 hour(s))  Comprehensive metabolic panel     Status: Abnormal   Collection Time: 10/19/20  6:58 AM  Result Value Ref Range   Sodium 139 135 - 145 mmol/L   Potassium 3.9 3.5 - 5.1 mmol/L   Chloride 105 98 - 111 mmol/L   CO2 29 22 - 32 mmol/L   Glucose, Bld 139 (H) 70 - 99 mg/dL    Comment: Glucose reference range applies only to samples taken after fasting for at least 8 hours.   BUN 9 6 - 20 mg/dL   Creatinine, Ser 5.400.91 0.61 - 1.24 mg/dL   Calcium 8.6 (L) 8.9 - 10.3 mg/dL   Total  Protein 7.8 6.5 - 8.1 g/dL   Albumin 2.9 (L) 3.5 - 5.0 g/dL   AST 17 15 - 41 U/L   ALT 17 0 - 44 U/L   Alkaline Phosphatase 61 38 - 126 U/L   Total Bilirubin 0.5 0.3 - 1.2 mg/dL   GFR, Estimated >24 >09 mL/min    Comment: (NOTE) Calculated using the CKD-EPI Creatinine Equation (2021)    Anion gap 5 5 - 15    Comment: Performed at [facility], [address] [place], [place] [address]    Blood Alcohol level:  Lab Results  Component Value Date   ETH 23 (H) 10/16/2020   ETH <10 09/05/2018    Metabolic Disorder Labs: Lab Results  Component Value Date   HGBA1C 5.4 10/18/2020   MPG 108 10/18/2020   No results found for: PROLACTIN Lab Results  Component Value Date   CHOL 121 10/18/2020   TRIG 164 (H) 10/18/2020   HDL 25 (L) 10/18/2020   CHOLHDL 4.8 10/18/2020   VLDL 33 10/18/2020   LDLCALC 63 10/18/2020   LDLCALC 64 04/06/2019    Physical Findings: AIMS:  , ,  ,  ,    CIWA:  CIWA-Ar Total: 1 COWS:     Musculoskeletal: Strength & Muscle Tone: within normal limits Gait & Station: normal Patient leans: N/A  Psychiatric Specialty Exam: Physical Exam Constitutional:      Appearance: Normal appearance.  HENT:     Head: Normocephalic.  Pulmonary:     Effort: Pulmonary effort is normal.  Neurological:     Mental Status: He is alert.     Review of Systems  Cardiovascular: Negative for chest pain.  Gastrointestinal: Negative for abdominal pain.  Neurological: Negative for headaches.    Blood pressure 124/89, pulse 86, temperature 97.8 F (36.6 C), temperature source Oral, resp. rate 16, height 6\' 1"  (1.854 m), weight 88.5 kg, SpO2 99 %.Body mass index is 25.73 kg/m.  General Appearance: Fairly Groomed  Eye Contact:  Good  Speech:  Clear and Coherent  Volume:  Normal  Mood:  Euthymic  Affect:  Congruent  Thought Process:  Coherent  Orientation:  Full (Time, Place, and Person)  Thought Content:  Logical  Suicidal Thoughts:  No   Homicidal Thoughts:  No  Memory:  Recent;   Good  Judgement:  Other:  Improving  Insight:  Shallow  Psychomotor Activity:  Normal  Concentration:  Concentration: Fair  Recall:  NA  Fund of Knowledge:  Good  Language:  Good  Akathisia:  No  Handed:  Right  AIMS (if indicated):     Assets:  Communication Skills Desire for Improvement Resilience  ADL's:  Intact  Cognition:  WNL  Sleep:  Number of Hours: 2     Treatment Plan Summary: Daily contact with patient to assess and evaluate symptoms and progress in treatment  Mr. Staver is a 40 yo patient with a hx of Bipolar 1 disorder, EtOH use disorder, Cocaine use disorder, and GAD. Patient endorses some improvement in his mood today and is not endorsing AVH. Patient condyloma acuminata have been very debilitated and patient was reporting that they made walking very painful as well as sitting. There was great concern that this would decrease patient's ability to fully participate and thereby fully benefit from his rehabilitation. Writer reached out to ID who did not think patient would be able to get surgical treatment at this time and recommended Aldara, topical cream.    BipolarIIdisorder, current episode  depressed Insomnia - Abilify 10mg   - Buspar 15mg  TID - Remeron 15mg  QHS  HIV -  Biktarvy  HTN - Continue home Toprol- XL 100mg    EtOH use disorder - Thiamine - MV - Ativan Detox Protocol  Perianal warts Likely Condyloma acuminata. Patient endorses that these warts are causing him significant pain.  - Lidocaine 5% ointment  - Aldara once MWF - F/u ID and PCP OP  PRN -Tylenol 650mg  q6h, pain -Maalox 2ml q4h, indigestion -Atarax 25mg  TID, anxiety -Milk of Mag 80mL, constipation   PGY-1  , MD 10/20/2020, 3:17 PM

## 2020-10-21 DIAGNOSIS — F332 Major depressive disorder, recurrent severe without psychotic features: Secondary | ICD-10-CM | POA: Diagnosis not present

## 2020-10-21 DIAGNOSIS — A630 Anogenital (venereal) warts: Secondary | ICD-10-CM

## 2020-10-21 DIAGNOSIS — F419 Anxiety disorder, unspecified: Secondary | ICD-10-CM

## 2020-10-21 MED ORDER — BUSPIRONE HCL 15 MG PO TABS
15.0000 mg | ORAL_TABLET | Freq: Three times a day (TID) | ORAL | Status: DC
  Administered 2020-10-21 – 2020-10-25 (×14): 15 mg via ORAL
  Filled 2020-10-21 (×9): qty 1
  Filled 2020-10-21: qty 42
  Filled 2020-10-21: qty 1
  Filled 2020-10-21: qty 42
  Filled 2020-10-21 (×6): qty 1
  Filled 2020-10-21: qty 42
  Filled 2020-10-21 (×2): qty 1

## 2020-10-21 MED ORDER — QUETIAPINE FUMARATE 100 MG PO TABS
100.0000 mg | ORAL_TABLET | Freq: Every day | ORAL | Status: DC
  Administered 2020-10-21 – 2020-10-25 (×5): 100 mg via ORAL
  Filled 2020-10-21: qty 14
  Filled 2020-10-21 (×4): qty 1
  Filled 2020-10-21 (×2): qty 14
  Filled 2020-10-21: qty 1

## 2020-10-21 MED ORDER — WHITE PETROLATUM EX OINT
TOPICAL_OINTMENT | CUTANEOUS | Status: AC
  Filled 2020-10-21: qty 10

## 2020-10-21 NOTE — Progress Notes (Signed)
[facility] MD Progress Note  Henry [name]  MRN:  [id number]  10/22/20  Chief Complaint: depression with AVH  Subjective: Patient is a 39y/o male with h/o HIV, anal warts, and bipolar d/o who was admitted for suicidal ideation with plan to jump off a bridge in the context of worsening depression. He states he has not been taking his HIV medications or his psychotropic medications for several months due to lack of self-care and general desire to die. He states he has been using crack cocaine and alcohol to self-medicate. He estimates that he has been drinking 3-4 40 oz beers daily and using 1 gram of crack cocaine daily for the last several months. The patient is currently on Hospital Day 5.   Chart Review from last 24 hours:  The patient's chart was reviewed and nursing notes were reviewed. The patient's case was discussed in multidisciplinary team meeting. Per nursing, the patient attended evening AA group but was somewhat isolated to his room during the day. He reported VH of a dark shadow during the day to staff and AVH the previous night to staff. Per [facility] he has been compliant with scheduled medications. He received PRN Tylenol and Lidocaine ointment for genital wart pain but required no PRNs for agitation or anxiety.  Information Obtained Today During Patient Interview: The patient was seen and evaluated on the unit. On assessment today the patient reports that he slept well overnight and has an improving appetite. He denies any further AVH since yesterday and denies ideas of reference, paranoia, or first rank symptoms. He states his mood is better and he denies SI or HI. He reports that his pain is better controlled. He denies side-effects with recent medication change. He states he had "some drug dreams" and has some cravings for alcohol but denies withdrawal symptoms. He had some anxiety earlier today which has improved.   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 The direct patient care time included face-to-face time with the patient, reviewing the patient's chart, communicating with other professionals, and coordinating care. Greater than 50% of this time was spent in counseling or coordinating care with the patient regarding goals of hospitalization, psycho-education, and discharge planning needs.  Past Psychiatric History: see admission H&P  Past Medical History:  Past Medical History:  Diagnosis Date  . Anxiety   . Bipolar 1 disorder (HCC)   . HIV (human immunodeficiency virus infection) (HCC)     Family History:  Family History  Problem Relation Age of Onset  . Thyroid disease Mother   . Heart disease Maternal Grandmother   . Breast cancer Maternal Grandmother    Family Psychiatric  History: see admission H&P  Social History:  Social History   Substance and Sexual Activity  Alcohol Use Yes  . Alcohol/week: 0.0 standard drinks   Comment: 2 a day off and on     Social History   Substance and Sexual Activity  Drug Use Yes  . Types: Marijuana, Cocaine, Amphetamines, Benzodiazepines, "Crack" cocaine    Social History   Socioeconomic History  . Marital status: Single  Spouse name: Not on file  . Number of children: Not on file  . Years of education: Not on file  . Highest education level: Not on file  Occupational History  . Not on file  Tobacco Use  . Smoking status: Current Every Day Smoker    Packs/day: 1.00    Years: 14.00    Pack years: 14.00    Types: Cigarettes  . Smokeless tobacco: Never Used  . Tobacco comment: trying to cut back  Vaping Use  .  Vaping Use: Never used  Substance and Sexual Activity  . Alcohol use: Yes    Alcohol/week: 0.0 standard drinks    Comment: 2 a day off and on  . Drug use: Yes    Types: Marijuana, Cocaine, Amphetamines, Benzodiazepines, "Crack" cocaine  . Sexual activity: Yes    Partners: Male    Birth control/protection: None, Condom  Other Topics Concern  . Not on file  Social History Narrative  . Not on file   Social Determinants of Health   Financial Resource Strain: Not on file  Food Insecurity: Not on file  Transportation Needs: Not on file  Physical Activity: Not on file  Stress: Not on file  Social Connections: Not on file   Sleep: Good  Appetite:  Improving  Current Medications: Current Facility-Administered Medications  Medication Dose Route Frequency Provider Last Rate Last Admin  . acetaminophen (TYLENOL) tablet 650 mg  650 mg Oral Q6H PRN Aldean BakerSykes, Janet E, NP   650 mg at 10/21/20 0807  . alum & mag hydroxide-simeth (MAALOX/MYLANTA) 200-200-20 MG/5ML suspension 30 mL  30 mL Oral Q4H PRN Aldean BakerSykes, Janet E, NP      . bictegravir-emtricitabine-tenofovir AF (BIKTARVY) 50-200-25 MG per tablet 1 tablet  1 tablet Oral Daily Aldean BakerSykes, Janet E, NP   1 tablet at 10/22/20 0824  . busPIRone (BUSPAR) tablet 15 mg  15 mg Oral TID Eliseo GumMcQuilla, Jai B, MD   15 mg at 10/22/20 1324  . ibuprofen (ADVIL) tablet 600 mg  600 mg Oral Q6H PRN Jaclyn Shaggyaylor, Cody W, PA-C   600 mg at 10/21/20 [id number]  . imiquimod (ALDARA) 5 % cream   Topical Once per day on Mon Wed Fri Nashawn Hillock E, MD   Given at 10/21/20 2150  . lidocaine (XYLOCAINE) 5 % ointment   Topical TID PRN Jaclyn Shaggyaylor, Cody W, PA-C   Given at 10/21/20 2150  . magnesium hydroxide (MILK OF MAGNESIA) suspension 30 mL  30 mL Oral Daily PRN Aldean BakerSykes, Janet E, NP      . menthol-cetylpyridinium (CEPACOL) lozenge 3 mg  1 lozenge Oral PRN Comer LocketSingleton, Haisley Arens E, MD   3 mg at 10/21/20 1059  . metoprolol succinate (TOPROL-XL) 24 hr tablet 100 mg  100 mg Oral Daily Aldean BakerSykes, Janet E, NP   100  mg at 10/22/20 0824  . mirtazapine (REMERON) tablet 15 mg  15 mg Oral QHS Eliseo GumMcQuilla, Jai B, MD   15 mg at 10/21/20 2149  . multivitamin with minerals tablet 1 tablet  1 tablet Oral Daily Aldean BakerSykes, Janet E, NP   1 tablet at 10/21/20 1802  . nicotine (NICODERM CQ - dosed in mg/24 hours) patch 21 mg  21 mg Transdermal Daily Armandina StammerNwoko, Agnes I, NP   21 mg at 10/22/20 0824  . QUEtiapine (SEROQUEL) tablet 100 mg  100 mg Oral QHS Eliseo GumMcQuilla, Jai B, MD   100 mg at 10/21/20 2149  . thiamine tablet 100 mg  100 mg Oral Daily Aldean BakerSykes, Janet E, NP  100 mg at 10/21/20 1802   Blood Alcohol level:  Lab Results  Component Value Date   ETH 23 (H) 10/16/2020   ETH <10 09/05/2018   Metabolic Disorder Labs: Lab Results  Component Value Date   HGBA1C 5.4 10/18/2020   MPG 108 10/18/2020   Lab Results  Component Value Date   CHOL 121 10/18/2020   TRIG 164 (H) 10/18/2020   HDL 25 (L) 10/18/2020   CHOLHDL 4.8 10/18/2020   VLDL 33 10/18/2020   LDLCALC 63 10/18/2020   LDLCALC 64 04/06/2019   Physical Findings: CIWA:  CIWA-Ar Total: 0  Musculoskeletal: Strength & Muscle Tone: within normal limits Gait & Station: normal , steady Patient leans: N/A  Psychiatric Specialty Exam: Physical Exam Vitals reviewed.  HENT:     Head: Normocephalic.  Pulmonary:     Effort: Pulmonary effort is normal.  Neurological:     Mental Status: He is alert.     Review of Systems - residual perianal pain from warts  Blood pressure (!) 134/95, pulse 90, temperature 97.7 F (36.5 C), temperature source Oral, resp. rate 18, height 6\' 1"  (1.854 m), weight 88.5 kg, SpO2 94 %.Body mass index is 25.73 kg/m.  General Appearance: fair hygiene, well engaged with examiner, appears stated age  Eye Contact:  Fair  Speech:  Clear and Coherent and Normal Rate  Volume:  Normal  Mood:  described as "better" - appears dysphoric  Affect:  Congruent  Thought Process:  Goal Directed but at times circumstantial  Orientation:  Full (Time,  Place, and Person)  Thought Content:  Denies AVH,ideas of reference, paranoia, or first rank symptoms; no evidence of delusions or acute psychosis on exam  Suicidal Thoughts:  Denied  Homicidal Thoughts:  Denied  Memory:  Recent;   Fair  Judgement:  Fair  Insight:  Fair  Psychomotor Activity:  Normal  Concentration:  Concentration: Fair  Recall:  of Knowledge:  Fair  Language:  Good  Akathisia:  Negative  Assets:  Communication Skills Desire for Improvement Resilience  ADL's:  Intact  Cognition:  WNL  Sleep:  Number of Hours: 5.75   Treatment Plan Summary: ASSESSMENT: Diagnoses / Active Problems: Bipolar II MRE depressed, severe with psychotic features Unspecified anxiety (r/o PTSD) HIV Anal warts  PLAN: 1. Safety and Monitoring:  -- Voluntary admission to inpatient psychiatric unit for safety, stabilization and treatment  -- Daily contact with patient to assess and evaluate symptoms and progress in treatment  -- Patient's case to be discussed in multi-disciplinary team meeting  -- Observation Level : q15 minute checks  -- Vital signs:  q12 hours  -- Precautions: suicide  2. Psychiatric Diagnoses and Treatment:  Bipolar II MRE depressed, severe with psychotic features Unspecified anxiety d/o (r/o PTSD) -- Continue Remeron 15mg  qhs for anxiety and depression -- Continue Seroquel 100mg  qhs for psychotic sx and augmentation of mood -- Continue Buspar 15mg  tid  -- PRN Vistaril 25mg  q8 hours PRN anxiety  -- Metabolic profile and EKG monitoring obtained while on an atypical antipsychotic (BMI:25.73 Lipid Panel: cholesterol 121, HDL 25, LDL 63, triglycerides 164; HbgA1c:5.4; QTc:441)   -- Encouraged patient to participate in unit milieu and in scheduled group therapies   -- Short Term Goals: Ability to identify changes in lifestyle to reduce recurrence of condition will improve and Ability to demonstrate self-control will improve  -- Long Term Goals: Improvement in  symptoms so as ready for discharge   Stimulant use disorder - cocaine type  Alcohol use d/o  -- Continue CIWA protocol with thiamine and MVI oral replacement (recent CIWA scores 1, 0)  -- Social work has made referrals for residential rehab programs and awaiting open bed  -- Short Term Goals: Ability to identify triggers associated with substance abuse/mental health issues will improve  -- Long Term Goals: Improvement in symptoms so as ready for discharge   3. Medical Issues Being Addressed:   Tobacco Use Disorder  -- Nicotine patch 21mg /24 hours ordered  -- Smoking cessation encouraged   HIV  -- Continue Biktarvy daily  -- will need f/u with ID after discharge   Anal warts  -- Continue Aldara cream three times weekly  -- Continue Lidocaine topical ointment PRN  -- will need f/u with ID after discharge   HTN  -- Continue Toprol XL 100mg  daily and monitoring BP (BP today 134/95)  4. Discharge Planning:   -- Social work and case management to assist with discharge planning and identification of hospital follow-up needs prior to discharge  -- Estimated LOS: 3-4 days  -- Discharge Concerns: Need to establish a safety plan; Medication compliance and effectiveness  -- Discharge Goals: Return home with outpatient referrals for mental health follow-up including medication management/psychotherapy  02-20-1985, MD, FAPA 10/22/2020, 3:55 PM

## 2020-10-21 NOTE — Progress Notes (Signed)
   10/21/20 0107  Psych Admission Type (Psych Patients Only)  Admission Status Voluntary  Psychosocial Assessment  Patient Complaints Anxiety  Eye Contact Fair  Facial Expression Sad  Affect Anxious;Sad;Depressed  Speech Logical/coherent  Interaction Assertive  Motor Activity Slow  Appearance/Hygiene Unremarkable  Behavior Characteristics Appropriate to situation  Mood Pleasant  Thought Process  Coherency WDL  Content Blaming self  Delusions None reported or observed  Perception WDL  Hallucination None reported or observed (patient reports seeing dark shadow following him)  Judgment Poor  Confusion None  Danger to Self  Current suicidal ideation? Denies  Self-Injurious Behavior No self-injurious ideation or behavior indicators observed or expressed   Agreement Not to Harm Self Yes  Description of Agreement verbal contract  Danger to Others  Danger to Others None reported or observed

## 2020-10-21 NOTE — Progress Notes (Signed)
   10/21/20 0601  Vital Signs  Temp 98.1 F (36.7 C)  Temp Source Oral  Pulse Rate 77  Pulse Rate Source Monitor  Resp 18  BP (!) 131/95  BP Location Right Arm  BP Method Automatic  Patient Position (if appropriate) Sitting  Oxygen Therapy  SpO2 100 %   D: Patient denies SI/HI, but was very upset this morning because he had AVH of someone in his room sitting on the other bed talking to him and saying bad things to him. Pt. Was confused and needed to be reoriented to reality. This AVH made him very anxious and depressed. Pt. Rated anxiety 8/10 and depression 10/10." Patient stated, "Am I crazy?" Pt. Complained of pain from genital warts in his peri/groin area. Pt.was unable to sleep because of this pain. Later in the afternoon patient was out in open areas and was social with peers and staff.  A:  Patient took scheduled medicine and 650 mg of Tylenol and lidocaine ointment for pain. Pt. Reported that this did relieve his pain. Pt complained of a sore throat and a cepacol  Lozenge helped relieve this pain Support and encouragement provided Routine safety checks conducted every 15 minutes. Patient  informed to notify staff with any concerns.   R: Safety maintained.

## 2020-10-21 NOTE — Progress Notes (Signed)
Recreation Therapy Notes  Date:  2.18.22 Time: 0930 Location: 300 [address] Group Room  Group Topic: Stress Management  Goal Area(s) Addresses:  Patient will identify positive stress management techniques. Patient will identify benefits of using stress management post d/c.  Behavioral Response: Engaged  Intervention: Stress Management  Activity: Meditation.  LRT played a meditation on mindful walking. Patients were to listen and envision themselves walking along the beach enjoying sights, sounds and the feeling of what it would be like to feel the sun and water on their skin.  Patients were to listen and follow along as meditation played to engage in activity.    Education:  Stress Management, Discharge Planning.   Education Outcome: Acknowledges Education  Clinical Observations/Feedback: Pt attended and participated in group.    Caroll Rancher, LRT/CTRS         Caroll Rancher A 10/21/2020 11:24 AM

## 2020-10-21 NOTE — [facility] Group Notes (Signed)
LCSW Group Therapy Note 10/21/2020 1:15pm  Type of Therapy and Topic:  Group Therapy:  Setting Goals  Participation Level:  Active  Description of Group: In this process group, patients discussed using strengths to work toward goals and address challenges.  Patients identified two positive things about themselves and one goal they were working on.  Patients were given the opportunity to share openly and support each other's plan for self-empowerment.  The group discussed the value of gratitude and were encouraged to have a daily reflection of positive characteristics or circumstances.  Patients were encouraged to identify a plan to utilize their strengths to work on current challenges and goals.  Therapeutic Goals 1. Patient will verbalize personal strengths/positive qualities and relate how these can assist with achieving desired personal goals 2. Patients will verbalize affirmation of peers plans for personal change and goal setting 3. Patients will explore the value of gratitude and positive focus as related to successful achievement of goals 4. Patients will verbalize a plan for regular reinforcement of personal positive qualities and circumstances.   Therapeutic Modalities Cognitive Behavioral Therapy Motivational Interviewing 

## 2020-10-21 NOTE — Progress Notes (Signed)
Pt. Upset this morning had a hallucination last night between 0300 and 0400 that someone came into his room and had a full conversation with him and were saying bad things. Pt upset this morning.

## 2020-10-21 NOTE — Progress Notes (Signed)
[facility]  10/21/2020 12:05 PM Henry [name]  MRN:  [id number] Subjective:  Patient reports that he did not sleep well last night as he had a "visitor" last night. Patient reports that this has happened on prior occassions, but last night patient reports that this visit really bothered him. Patient reports that this AM he is still trying to work "what is real and what is not." Patient reports that the guest in was in his room and they had a conversation and he knows that the "guest" was doing "crack." Patient reports that not only was he very disturbed, but this AM he was having a few cravings for crack but he did not want to act on them. Patient reports that he does not want to ruing the work he has already done and the progress he has made while here inpatient. Patient is vrry concerned that he will be labeled "crazy" because he sees things at night sometimes. Patient reports that the last time he saw these "guest" was when he was in jail and he recalls being terrifed to sleep and reports that the guest would mess with his bedding and take his bible. Patient reports " I am not crazy, my grandmother taught us all witchcraft" and believes that he sees these "guest" because he knows how to do witchcraft. After discussion the patient does report that he does not want to lose sleep due to the fear of another "guest" and endorses that he is willing to stop Abilify and try another more sedating antipsychotic. Patient does not endorse SI nor HI. Patient remains very interested in rehab. Patient also reports that he will continue to spend time out of his room.  Principal Problem: Bipolar 2 disorder, major depressive episode (HCC) Diagnosis: Principal Problem:   Bipolar 2 disorder, major depressive episode (HCC) Active Problems:   HIV disease (HCC)   Tobacco use disorder   Cocaine abuse (HCC)   ETOH abuse  Total Time spent with patient: 30 minutes  Past Psychiatric History: See H&P  Past Medical  History:  Past Medical History:  Diagnosis Date  . Anxiety   . Bipolar 1 disorder (HCC)   . HIV (human immunodeficiency virus infection) (HCC)    History reviewed. No pertinent surgical history. Family History:  Family History  Problem Relation Age of Onset  . Thyroid disease Mother   . Heart disease Maternal Grandmother   . Breast cancer Maternal Grandmother    Family Psychiatric  History: See H&P Social History:  Social History   Substance and Sexual Activity  Alcohol Use Yes  . Alcohol/week: 0.0 standard drinks   Comment: 2 a day off and on     Social History   Substance and Sexual Activity  Drug Use Yes  . Types: Marijuana, Cocaine, Amphetamines, Benzodiazepines, "Crack" cocaine    Social History   Socioeconomic History  . Marital status: Single    Spouse name: Not on file  . Number of children: Not on file  . Years of education: Not on file  . Highest education level: Not on file  Occupational History  . Not on file  Tobacco Use  . Smoking status: Current Every Day Smoker    Packs/day: 1.00    Years: 14.00    Pack years: 14.00    Types: Cigarettes  . Smokeless tobacco: Never Used  . Tobacco comment: trying to cut back  Vaping Use  . Vaping Use: Never used  Substance and Sexual Activity  . Alcohol  use: Yes    Alcohol/week: 0.0 standard drinks    Comment: 2 a day off and on  . Drug use: Yes    Types: Marijuana, Cocaine, Amphetamines, Benzodiazepines, "Crack" cocaine  . Sexual activity: Yes    Partners: Male    Birth control/protection: None, Condom  Other Topics Concern  . Not on file  Social History Narrative  . Not on file   Social Determinants of Health   Financial Resource Strain: Not on file  Food Insecurity: Not on file  Transportation Needs: Not on file  Physical Activity: Not on file  Stress: Not on file  Social Connections: Not on file   Additional Social History:                         Sleep: Poor  Appetite:   Good  Current Medications: Current Facility-Administered Medications  Medication Dose Route Frequency Provider Last Rate Last Admin  . acetaminophen (TYLENOL) tablet 650 mg  650 mg Oral Q6H PRN Aldean Baker, NP   650 mg at 10/21/20 0807  . alum & mag hydroxide-simeth (MAALOX/MYLANTA) 200-200-20 MG/5ML suspension 30 mL  30 mL Oral Q4H PRN Aldean Baker, NP      . bictegravir-emtricitabine-tenofovir AF (BIKTARVY) 50-200-25 MG per tablet 1 tablet  1 tablet Oral Daily Aldean Baker, NP   1 tablet at 10/21/20 0803  . busPIRone (BUSPAR) tablet 15 mg  15 mg Oral TID Eliseo Gum B, MD      . ibuprofen (ADVIL) tablet 600 mg  600 mg Oral Q6H PRN Jaclyn Shaggy, PA-C   600 mg at 10/21/20 0027  . imiquimod (ALDARA) 5 % cream   Topical Once per day on Mon Wed Fri Singleton, Amy E, MD      . lidocaine (XYLOCAINE) 5 % ointment   Topical TID PRN Jaclyn Shaggy, PA-C   Given at 10/21/20 0809  . magnesium hydroxide (MILK OF MAGNESIA) suspension 30 mL  30 mL Oral Daily PRN Aldean Baker, NP      . menthol-cetylpyridinium (CEPACOL) lozenge 3 mg  1 lozenge Oral PRN Comer Locket, MD   3 mg at 10/21/20 1059  . metoprolol succinate (TOPROL-XL) 24 hr tablet 100 mg  100 mg Oral Daily Aldean Baker, NP   100 mg at 10/21/20 0804  . mirtazapine (REMERON) tablet 15 mg  15 mg Oral QHS Eliseo Gum B, MD   15 mg at 10/20/20 2102  . multivitamin with minerals tablet 1 tablet  1 tablet Oral Daily Aldean Baker, NP   1 tablet at 10/20/20 1741  . nicotine (NICODERM CQ - dosed in mg/24 hours) patch 21 mg  21 mg Transdermal Daily Armandina Stammer I, NP   21 mg at 10/20/20 1303  . QUEtiapine (SEROQUEL) tablet 100 mg  100 mg Oral QHS McQuilla, Jai B, MD      . thiamine tablet 100 mg  100 mg Oral Daily Aldean Baker, NP   100 mg at 10/20/20 1741    Lab Results: No results found for this or any previous visit (from the past 48 hour(s)).  Blood Alcohol level:  Lab Results  Component Value Date   ETH 23 (H) 10/16/2020    ETH <10 09/05/2018    Metabolic Disorder Labs: Lab Results  Component Value Date   HGBA1C 5.4 10/18/2020   MPG 108 10/18/2020   No results found for: PROLACTIN Lab Results  Component Value Date  CHOL 121 10/18/2020   TRIG 164 (H) 10/18/2020   HDL 25 (L) 10/18/2020   CHOLHDL 4.8 10/18/2020   VLDL 33 10/18/2020   LDLCALC 63 10/18/2020   LDLCALC 64 04/06/2019    Physical Findings: AIMS:  , ,  ,  ,    CIWA:  CIWA-Ar Total: 1 COWS:     Musculoskeletal: Strength & Muscle Tone: within normal limits Gait & Station: normal Patient leans: N/A  Psychiatric Specialty Exam: Physical Exam HENT:     Head: Normocephalic and atraumatic.  Pulmonary:     Effort: Pulmonary effort is normal.  Neurological:     Mental Status: He is alert.     Review of Systems  Cardiovascular: Negative for chest pain.  Gastrointestinal: Negative for abdominal distention.  Genitourinary:       Pain in the perianal region at the site of condyloma acuminata    Blood pressure (!) 139/99, pulse 80, temperature 98.1 F (36.7 C), temperature source Oral, resp. rate 18, height 6\' 1"  (1.854 m), weight 88.5 kg, SpO2 100 %.Body mass index is 25.73 kg/m.  General Appearance: Casual  Eye Contact:  Fair  Speech:  Clear and Coherent  Volume:  Decreased  Mood:  Anxious  Affect:  Congruent  Thought Process:  Coherent  Orientation:  Full (Time, Place, and Person)  Thought Content:  Hallucinations: Auditory Visual but still mostly logical as he knows what he saw is not normal and acknowledges that he should not act on his cravings.   Suicidal Thoughts:  No  Homicidal Thoughts:  No  Memory:  Recent;   Good  Judgement:  Other:  Improving  Insight:  Shallow patient does not want to acknowledge that he has hallucinations, but rather calls them "guest" and associates this with witchcraft that he was brought up learning.   Psychomotor Activity:  Normal  Concentration:  Concentration: Fair  Recall:  NA  Fund of  Knowledge:  Fair  Language:  Good  Akathisia:  No    AIMS (if indicated):     Assets:  Communication Skills Desire for Improvement Resilience  ADL's:  Intact  Cognition:  WNL  Sleep:  Number of Hours: 0.25     Treatment Plan Summary: Daily contact with patient to assess and evaluate symptoms and progress in treatment Henry [name] is a 39 yo patient with a hx of Bipolar 1 disorder, EtOH use disorder, Cocaine use disorder, and GAD.On exam today patient endorses AVH overnight and reports being very bothered by this. Patient does not want to acknowledge that he hallucinated and in fact does not like the term "hallucination." Patient endorses a hx of hallucinations intermittently in his life that appear to be debilitating causing loss of sleep and questioning reality. Will discontinue patient Abilify and start patient on Seroquel to aid in sleep but also treat his hallucinations. Will reassess in AM. Seroquel is not known to have any cross reaction with Biktarvy. The Seroquel may also help augment mood, patient was noted to be very anxious yesterday on the unit and appeared to have somatization of symptoms as he reported being dizzy. Patient vitals were checked and were non concerning. Patient responded well to his PRN Atarax after this incident.  BipolarIIdisorder, current episode depressed Insomnia - Discontinue Abilify 10mg  -  Start Seroquel 100mg  QHS - Buspar 15mg  TID - Remeron 15mg  QHS  HIV -  Biktarvy  HTN - Continue home Toprol- XL 100mg    EtOH use disorder - Thiamine - MV - Ativan Detox Protocol  Perianal warts Likely Condyloma acuminata. Patient endorses that these warts are causing him significant pain.  - Lidocaine 5% ointment  - Aldara once MWF - F/u ID and PCP OP  PRN -Tylenol 650mg  q6h, pain -Maalox 82ml q4h, indigestion -Atarax 25mg  TID, anxiety -Milk of Mag 82mL, constipation  PGY-1 , MD 10/21/2020, 12:05 PM

## 2020-10-22 DIAGNOSIS — F332 Major depressive disorder, recurrent severe without psychotic features: Secondary | ICD-10-CM | POA: Diagnosis not present

## 2020-10-22 MED ORDER — HYDROXYZINE HCL 25 MG PO TABS
25.0000 mg | ORAL_TABLET | Freq: Three times a day (TID) | ORAL | Status: DC | PRN
  Administered 2020-10-23 – 2020-10-25 (×5): 25 mg via ORAL
  Filled 2020-10-22 (×5): qty 1
  Filled 2020-10-22: qty 20

## 2020-10-22 NOTE — Progress Notes (Signed)
Patient presents with a flat affect and anxious mood. He reports decreased depression 0/10 and increased anxiety 8/10 (10 being worst). He requested vistaril for anxiety. Dr. Mason Jim made aware in treatment team this morning that the pt needed an order for vistaril for his anxiety. He denies SI/HI. He reports sleeping well last night with the new order of Seroquel but reported that it made him feel a little tired this morning. He denies AVH.   Orders reviewed. Vital signs reviewed. Verbal support provided. 15 minute checks performed for safety.   Patient compliant with taking medications and attending groups.

## 2020-10-22 NOTE — [facility] Group Notes (Signed)
Psychoeducational Group Note  Date: 10-22-2020 Time: 0900-1000    Goal Setting   Purpose of Group: This group helps to provide patients with the steps of setting a goal that is specific, measurable, attainable, realistic and time specific. A discussion on how we keep ourselves stuck with negative self talk.    Participation Level:  Did not attend   Saverio Kader A  

## 2020-10-22 NOTE — Progress Notes (Signed)
   10/21/20 0807  Psych Admission Type (Psych Patients Only)  Admission Status Voluntary  Psychosocial Assessment  Patient Complaints Anxiety;Depression  Eye Contact Fair  Facial Expression Sad  Affect Anxious;Sad;Depressed  Speech Logical/coherent  Interaction Assertive  Motor Activity Slow  Appearance/Hygiene Unremarkable  Behavior Characteristics Anxious  Mood Depressed;Anxious  Thought Process  Coherency WDL  Content Blaming self  Delusions Paranoid  Perception WDL  Hallucination Auditory;Visual (patient reports seeing dark shadow following him)  Judgment Poor  Confusion None  Danger to Self  Current suicidal ideation? Denies  Self-Injurious Behavior No self-injurious ideation or behavior indicators observed or expressed   Agreement Not to Harm Self Yes  Description of Agreement verbal contract  Danger to Others  Danger to Others None reported or observed  D: Patient in room keeping to himself. Pt interaction is minimal. Pt attended evening AA group A: Medications administered as prescribed. Support and encouragement provided as needed.  R: Patient remains safe on the unit. Will continue to monitor for safety and stability.

## 2020-10-22 NOTE — [facility] Group Notes (Signed)
[facility] Group Notes: (Clinical Social Work)   10/22/2020      Type of Therapy:  Group Therapy   Participation Level:  Did Not Attend - was invited both individually by MHT and by overhead announcement, chose not to attend.   Ambrose Mantle, LCSW 10/22/2020, 3:10 PM

## 2020-10-22 NOTE — Progress Notes (Signed)
[facility] Group Notes:  (Nursing/MHT/Case Management/Adjunct)  Date:  10/22/2020  Time: 2015 Type of Therapy:  wrap up group  Participation Level:  Active  Participation Quality:  Appropriate, Attentive, Sharing and Supportive  Affect:  Appropriate  Cognitive:  Appropriate  Insight:  Improving  Engagement in Group:  Engaged  Modes of Intervention:  Clarification, Education and Support  Summary of Progress/Problems: Positive thinking and positive change were discussed.   Henry [name] 10/22/2020, 8:30 PM

## 2020-10-22 NOTE — [facility] Group Notes (Signed)
Psychoeducational Group Note    Date:10/22/2020 Time: 1300-1400    Life Skills:  A group where two lists are made. What people need and what are things that we do that are healthy. The lists are developed by the patients and it is explained that we often do the actions that are not healthy to get our list of needs met.   Purpose of Group: . The group focus' on teaching patients on how to identify their needs and how to develop the coping skills needed to get their needs met  Participation Level:  Did not attend   Henry [name]

## 2020-10-23 DIAGNOSIS — F332 Major depressive disorder, recurrent severe without psychotic features: Secondary | ICD-10-CM | POA: Diagnosis not present

## 2020-10-23 MED ORDER — MENTHOL 3 MG MT LOZG: 1.0000 | LOZENGE | OROMUCOSAL | Status: DC | PRN

## 2020-10-23 MED ORDER — WHITE PETROLATUM EX OINT
TOPICAL_OINTMENT | CUTANEOUS | Status: AC
  Filled 2020-10-23: qty 5

## 2020-10-23 MED ORDER — SALINE SPRAY 0.65 % NA SOLN: 1.0000 | NASAL | Status: DC | PRN

## 2020-10-23 MED ORDER — HYDROCORTISONE 1 % EX CREA
TOPICAL_CREAM | Freq: Two times a day (BID) | CUTANEOUS | Status: DC
  Filled 2020-10-23 (×2): qty 28

## 2020-10-23 MED ORDER — DIPHENHYDRAMINE HCL 25 MG PO CAPS
25.0000 mg | ORAL_CAPSULE | ORAL | Status: DC | PRN
  Administered 2020-10-23: 25 mg via ORAL
  Filled 2020-10-23: qty 1

## 2020-10-23 NOTE — [facility] Group Notes (Signed)
Adult Psychoeducational Group Not Date:  10/23/2020 Time:  0900-1045 Group Topic/Focus: PROGRESSIVE RELAXATION. [name] group where deep breathing is taught and tensing and relaxation muscle groups is used. Imagery is used as well.  Pts are asked to imagine 3 pillars that hold them up when they are not able to hold themselves up.  Participation Level:  Did not attend   Henry [name]   

## 2020-10-23 NOTE — [facility] Group Notes (Signed)
[facility] LCSW Group Therapy Note  10/23/2020    Type of Therapy and Topic:  Group Therapy:  Adding Supports Including Yourself  Participation Level:  Did Not Attend    Henry [name]       

## 2020-10-23 NOTE — Progress Notes (Signed)
   10/23/20 0024  Psych Admission Type (Psych Patients Only)  Admission Status Voluntary  Psychosocial Assessment  Patient Complaints Anxiety  Eye Contact Fair  Facial Expression Flat  Affect Appropriate to circumstance  Speech Logical/coherent  Interaction Assertive  Motor Activity Other (Comment) (WDL)  Appearance/Hygiene Unremarkable  Behavior Characteristics Appropriate to situation  Mood Pleasant  Thought Process  Coherency WDL  Content WDL  Delusions None reported or observed  Perception WDL  Hallucination None reported or observed  Judgment Poor  Confusion None  Danger to Self  Current suicidal ideation? Denies  Self-Injurious Behavior No self-injurious ideation or behavior indicators observed or expressed   Agreement Not to Harm Self Yes  Description of Agreement verbal contract  Danger to Others  Danger to Others None reported or observed

## 2020-10-23 NOTE — [facility] Group Notes (Signed)
Psychoeducational Group Note  Date: 10-23-20 Time:  1300  Group Topic/Focus:  Making Healthy Choices:   The focus of this group is to help patients identify negative/unhealthy choices they were using prior to admission and identify positive/healthier coping strategies to replace them upon discharge.In this group, patients started asking about the brain and how the brain works with and how the chemicals work for those who use substances, the pros and cons of saboxone.  Participation Level:  Did ot attend  P Dione Housekeeper

## 2020-10-23 NOTE — Progress Notes (Signed)
   10/23/20 2210  COVID-19 Daily Checkoff  Have you had a fever (temp > 37.80C/100F)  in the past 24 hours?  No  If you have had runny nose, nasal congestion, sneezing in the past 24 hours, has it worsened? No  COVID-19 EXPOSURE  Have you traveled outside the state in the past 14 days? No  Have you been in contact with someone with a confirmed diagnosis of COVID-19 or PUI in the past 14 days without wearing appropriate PPE? No  Have you been living in the same home as a person with confirmed diagnosis of COVID-19 or a PUI (household contact)? No  Have you been diagnosed with COVID-19? No

## 2020-10-23 NOTE — Progress Notes (Signed)
   10/23/20 0021  COVID-19 Daily Checkoff  Have you had a fever (temp > 37.80C/100F)  in the past 24 hours?  No  If you have had runny nose, nasal congestion, sneezing in the past 24 hours, has it worsened? No  COVID-19 EXPOSURE  Have you traveled outside the state in the past 14 days? No  Have you been in contact with someone with a confirmed diagnosis of COVID-19 or PUI in the past 14 days without wearing appropriate PPE? No  Have you been living in the same home as a person with confirmed diagnosis of COVID-19 or a PUI (household contact)? No  Have you been diagnosed with COVID-19? No

## 2020-10-23 NOTE — Progress Notes (Addendum)
[facility] MD Progress Note  10/23/2020 4:52 PM Henry [name]  MRN:  [id number] Subjective:  This AM patient reports that he woke up itching all over his body and despite showering and putting lotions on he continued. Patient reports that he only had relief after taking Vistaril. Patient reports that otherwise he felt that he slept much better with the Seroquel and would like to continue. With further questioning patient recalls that he did have some AH last night while asleep, but it does not bother him as much as having AVH. Patient reports that he has been spending his time thinking about his addictions and rehab and how he can avoid the same mistakes he has made in the past. Patient reports that he does intend to continue his medications.Patient reports " I need to invest in myself."  Patient notes that he has been eating more and also is feeling better each day. Patient also denies feelings of dysregulated mood.  Patient denies SI, HI, and VH.  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 Anxiety   . Bipolar 1 disorder (HCC)   . HIV (human immunodeficiency virus infection) (HCC)    History reviewed. No pertinent surgical history. Family History:  Family History  Problem Relation Age of Onset  . Thyroid disease Mother   . Heart disease Maternal Grandmother   . Breast cancer Maternal Grandmother    Family Psychiatric  History: See H&P Social History:  Social History   Substance and Sexual Activity  Alcohol Use Yes  . Alcohol/week: 0.0 standard drinks   Comment: 2 a  day off and on     Social History   Substance and Sexual Activity  Drug Use Yes  . Types: Marijuana, Cocaine, Amphetamines, Benzodiazepines, "Crack" cocaine    Social History   Socioeconomic History  . Marital status: Single    Spouse name: Not on file  . Number of children: Not on file  . Years of education: Not on file  . Highest education level: Not on file  Occupational History  . Not on file  Tobacco Use  . Smoking status: Current Every Day Smoker    Packs/day: 1.00    Years: 14.00    Pack years: 14.00    Types: Cigarettes  . Smokeless tobacco: Never Used  . Tobacco comment: trying to cut back  Vaping Use  . Vaping Use: Never used  Substance and Sexual Activity  . Alcohol use: Yes    Alcohol/week: 0.0 standard drinks    Comment: 2 a day off and on  . Drug use: Yes    Types: Marijuana, Cocaine, Amphetamines, Benzodiazepines, "Crack" cocaine  . Sexual activity: Yes    Partners: Male    Birth control/protection: None, Condom  Other Topics Concern  . Not on file  Social History Narrative  . Not on file   Social Determinants of Health   Financial Resource Strain: Not on file  Food Insecurity: Not on file  Transportation Needs: Not on file  Physical Activity: Not on file  Stress: Not on file  Social Connections:  Not on file   Additional Social History:                         Sleep: Improving  Appetite:  Good  Current Medications: Current Facility-Administered Medications  Medication Dose Route Frequency Provider Last Rate Last Admin  . acetaminophen (TYLENOL) tablet 650 mg  650 mg Oral Q6H PRN Aldean Baker, NP   650 mg at 10/21/20 0807  . alum & mag hydroxide-simeth (MAALOX/MYLANTA) 200-200-20 MG/5ML suspension 30 mL  30 mL Oral Q4H PRN Aldean Baker, NP      . bictegravir-emtricitabine-tenofovir AF (BIKTARVY) 50-200-25 MG per tablet 1 tablet  1 tablet Oral Daily Aldean Baker, NP   1 tablet at 10/23/20 0744  . busPIRone (BUSPAR) tablet 15  mg  15 mg Oral TID Eliseo Gum B, MD   15 mg at 10/23/20 1227  . diphenhydrAMINE (BENADRYL) capsule 25 mg  25 mg Oral Q4H PRN Eliseo Gum B, MD   25 mg at 10/23/20 1247  . hydrocortisone cream 1 %   Topical BID Eliseo Gum B, MD      . hydrOXYzine (ATARAX/VISTARIL) tablet 25 mg  25 mg Oral Q8H PRN Bartholomew Crews E, MD   25 mg at 10/23/20 0515  . ibuprofen (ADVIL) tablet 600 mg  600 mg Oral Q6H PRN Jaclyn Shaggy, PA-C   600 mg at 10/21/20 2542  . imiquimod (ALDARA) 5 % cream   Topical Once per day on Mon Wed Fri Marcie Shearon E, MD   Given at 10/21/20 2150  . lidocaine (XYLOCAINE) 5 % ointment   Topical TID PRN Jaclyn Shaggy, PA-C   Given at 10/23/20 [phone]  . magnesium hydroxide (MILK OF MAGNESIA) suspension 30 mL  30 mL Oral Daily PRN Aldean Baker, NP      . menthol-cetylpyridinium (CEPACOL) lozenge 3 mg  1 lozenge Oral PRN Comer Locket, MD   3 mg at 10/22/20 1659  . metoprolol succinate (TOPROL-XL) 24 hr tablet 100 mg  100 mg Oral Daily Aldean Baker, NP   100 mg at 10/23/20 0744  . mirtazapine (REMERON) tablet 15 mg  15 mg Oral QHS Eliseo Gum B, MD   15 mg at 10/22/20 2128  . multivitamin with minerals tablet 1 tablet  1 tablet Oral Daily Aldean Baker, NP   1 tablet at 10/22/20 1659  . nicotine (NICODERM CQ - dosed in mg/24 hours) patch 21 mg  21 mg Transdermal Daily Armandina Stammer I, NP   21 mg at 10/23/20 0744  . QUEtiapine (SEROQUEL) tablet 100 mg  100 mg Oral QHS Eliseo Gum B, MD   100 mg at 10/22/20 2128  . thiamine tablet 100 mg  100 mg Oral Daily Aldean Baker, NP   100 mg at 10/22/20 1658    Lab Results: No results found for this or any previous visit (from the past 48 hour(s)).  Blood Alcohol level:  Lab Results  Component Value Date   ETH 23 (H) 10/16/2020   ETH <10 09/05/2018    Metabolic Disorder Labs: Lab Results  Component Value Date   HGBA1C 5.4 10/18/2020   MPG 108 10/18/2020   No results found for: PROLACTIN Lab Results  Component Value Date    CHOL 121 10/18/2020   TRIG 164 (H) 10/18/2020   HDL 25 (L) 10/18/2020   CHOLHDL 4.8 10/18/2020   VLDL 33 10/18/2020   LDLCALC 63 10/18/2020   LDLCALC  64 04/06/2019    Physical Findings: AIMS: Facial and Oral Movements Muscles of Facial Expression: None, normal Lips and Perioral Area: None, normal Jaw: None, normal Tongue: None, normal,Extremity Movements Upper (arms, wrists, hands, fingers): None, normal Lower (legs, knees, ankles, toes): None, normal, Trunk Movements Neck, shoulders, hips: None, normal, Overall Severity Severity of abnormal movements (highest score from questions above): None, normal Incapacitation due to abnormal movements: None, normal Patient's awareness of abnormal movements (rate only patient's report): No Awareness, Dental Status Current problems with teeth and/or dentures?: No Does patient usually wear dentures?: No  CIWA:  CIWA-Ar Total: 0 COWS:     Musculoskeletal: Strength & Muscle Tone: within normal limits Gait & Station: normal Patient leans: N/A  Psychiatric Specialty Exam: Physical Exam Constitutional:      Appearance: Normal appearance.  HENT:     Head: Normocephalic and atraumatic.  Pulmonary:     Effort: Pulmonary effort is normal.  Skin:    General: Skin is warm and dry.     Comments: Erythema in areas where patient has been scratching on the lateral aspect of his abdomin bilaterally. Skin does not appear dry. There a few non clustered papules, but do not appear to be hives.  Neurological:     Mental Status: He is alert.     Review of Systems  Constitutional: Positive for appetite change.       Patient notes he was hungrier yesterday  HENT: Positive for sore throat.   Cardiovascular: Negative for chest pain.  Gastrointestinal: Negative for abdominal pain.    Blood pressure 119/86, pulse 71, temperature 98.2 F (36.8 C), temperature source Oral, resp. rate 20, height 6\' 1"  (1.854 m), weight 88.5 kg, SpO2 99 %.Body mass index  is 25.73 kg/m.  General Appearance: Fairly Groomed  Eye Contact:  Good  Speech:  Clear and Coherent  Volume:  Normal  Mood:  Euthymic  Affect:  Congruent  Thought Process:  Coherent  Orientation:  NA  Thought Content:  Logical and Hallucinations: Auditory (AH at night only)  Suicidal Thoughts:  No  Homicidal Thoughts:  No  Memory:  Recent;   Fair  Judgement:  Other:  Improving  Insight:  Shallow  Psychomotor Activity:  Normal  Concentration:  Concentration: Fair  Recall:  NA  Fund of Knowledge:  Good  Language:  Good  Akathisia:  No    AIMS (if indicated):     Assets:  Communication Skills Desire for Improvement Resilience  ADL's:  Intact  Cognition:  WNL  Sleep:  Number of Hours: 3.75     Treatment Plan Summary: Daily contact with patient to assess and evaluate symptoms and progress in treatment  On exam today patient was noted to be scratching at his skin; however on physical exam no hives were noted and patient reported that vistaril had stopped the itching. Despite running through patient's recent diet, linen, clothing, and shower regimen nothing could be determined to be the cause of what appears to be a contact dermatitis. Patient has not started any new medications in the last 24 hours; however patient is hesitant to increase Seroquel but does not want to stop it either as this is his most recent medication change.Seroquel is not known to cause pruritis.  Patient could benefit from an increase as he continues to endorse AH at night, but do to his current itching will hold and monitor for possible etiologies for this onset diffuse pruritus. At this time patient reports feeling that the Seroquel is beneficial  to his sleep and lack of bothersome hallucinations.   Diagnoses / Active Problems: Bipolar II MRE depressed, severe with psychotic features Unspecified anxiety (r/o PTSD) HIV Anal warts  PLAN: 1. Safety and Monitoring:             -- Voluntary admission to  inpatient psychiatric unit for safety, stabilization and treatment             -- Daily contact with patient to assess and evaluate symptoms and progress in treatment             -- Patient's case to be discussed in multi-disciplinary team meeting             -- Observation Level : q15 minute checks             -- Vital signs:  q12 hours             -- Precautions: suicide  2. Psychiatric Diagnoses and Treatment:  Bipolar II MRE depressed, severe with psychotic features Unspecified anxiety d/o (r/o PTSD) -- Continue Remeron  qhs for anxiety and depression -- Continue Seroquel  qhs for psychotic sx and augmentation of mood -- Continue Buspar  tid    -- PRN Vistaril  q8 hours PRN anxiety             -- Metabolic profile and EKG monitoring obtained while on an atypical antipsychotic (BMI:25.73 Lipid Panel: cholesterol 121, HDL 25, LDL 63, triglycerides 164; HbgA1c:5.4; QTc:441)              -- Encouraged patient to participate in unit milieu and in scheduled group therapies              -- Short Term Goals: Ability to identify changes in lifestyle to reduce recurrence of condition will improve and Ability to demonstrate self-control will improve             -- Long Term Goals: Improvement in symptoms so as ready for discharge              Stimulant use disorder - cocaine type             Alcohol use d/o             -- Continue CIWA protocol with thiamine and MVI oral replacement (recent CIWA scores 1, 0)             -- Social work has made referrals for residential rehab programs and awaiting open bed             -- Short Term Goals: Ability to identify triggers associated with substance abuse/mental health issues will improve             -- Long Term Goals: Improvement in symptoms so as ready for discharge              3. Medical Issues Being Addressed:              Tobacco Use Disorder             -- Nicotine patch /24 hours ordered             -- Smoking cessation  encouraged              HIV             -- Continue Biktarvy daily             -- will need f/u with ID after discharge  Anal warts             -- Continue Aldara cream three times weekly             -- Continue Lidocaine topical ointment PRN             -- will need f/u with ID after discharge              HTN             -- Continue Toprol XL 100mg  daily and monitoring BP (BP today 134/95)   Puriritis  -- Vistaril 25mg  q6 hours PRN and hydrocortisone cream topically PRN  4. Discharge Planning:              -- Social work and case management to assist with discharge planning and identification of hospital follow-up needs prior to discharge             -- Estimated LOS: 3-4 days             -- Discharge Concerns: Need to establish a safety plan; Medication compliance and effectiveness             -- Discharge Goals: Return home with outpatient referrals for mental health follow-up including medication management/psychotherapy PGY-1 , MD 10/23/2020, 4:52 PM

## 2020-10-23 NOTE — Progress Notes (Signed)
Patient rates depression 5/10, anxiety 5/10, and hopelessness 5/10 (10 being worst). He denies SI/HI. He reports fair sleep last night. He reports having a fair appetite today. He c/o of redness and itching to his lateral abdomen and voiced that he's unsure of what's causing the skin reaction. Dr. Morrie Sheldon made aware. The pt was assessed by the MD and given benadryl.   Orders reviewed. Vital signs reviewed. Verbal support provided. 15 minute checks performed for safety.   Patient compliant with treatment plan.

## 2020-10-23 NOTE — Progress Notes (Signed)
   10/23/20 2212  Psych Admission Type (Psych Patients Only)  Admission Status Voluntary  Psychosocial Assessment  Patient Complaints Anxiety  Eye Contact Fair  Facial Expression Animated  Affect Appropriate to circumstance  Speech Logical/coherent  Interaction Assertive  Motor Activity Other (Comment) (WDL)  Appearance/Hygiene Unremarkable  Behavior Characteristics Appropriate to situation  Mood Anxious;Pleasant  Thought Process  Coherency WDL  Content WDL  Delusions None reported or observed  Perception WDL  Hallucination None reported or observed  Judgment Poor  Confusion None  Danger to Self  Current suicidal ideation? Denies  Self-Injurious Behavior No self-injurious ideation or behavior indicators observed or expressed   Agreement Not to Harm Self Yes  Description of Agreement verbal contract  Danger to Others  Danger to Others None reported or observed

## 2020-10-24 DIAGNOSIS — F332 Major depressive disorder, recurrent severe without psychotic features: Secondary | ICD-10-CM | POA: Diagnosis not present

## 2020-10-24 NOTE — Tx Team (Signed)
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 acetaminophen (TYLENOL) tablet 650 mg  650 mg Oral Q6H PRN Aldean Baker, NP   650 mg at 10/21/20 0807  . alum & mag hydroxide-simeth (MAALOX/MYLANTA) 200-200-20 MG/5ML suspension 30 mL  30 mL Oral Q4H PRN Aldean Baker, NP      . bictegravir-emtricitabine-tenofovir AF (BIKTARVY) 50-200-25 MG per tablet 1 tablet  1 tablet Oral Daily Aldean Baker, NP   1 tablet at 10/24/20 1950  . busPIRone (BUSPAR) tablet 15 mg  15 mg Oral TID Eliseo Gum B, MD   15 mg at 10/24/20 0807  . hydrocortisone cream 1 %   Topical BID Bobbye Morton, MD   Given at 10/24/20 [phone]  . hydrOXYzine (ATARAX/VISTARIL) tablet 25 mg  25 mg Oral Q8H PRN Comer Locket, MD   25 mg at 10/24/20 0816  . ibuprofen (ADVIL) tablet 600 mg  600 mg Oral Q6H PRN Jaclyn Shaggy, PA-C   600 mg at 10/24/20 0813  . imiquimod (ALDARA) 5 % cream   Topical Once per day on Mon Wed Fri Singleton, Amy E, MD   Given at 10/21/20 2150  . lidocaine (XYLOCAINE) 5 % ointment   Topical TID PRN Jaclyn Shaggy, PA-C   Given at 10/24/20 7124  . magnesium hydroxide (MILK OF MAGNESIA) suspension 30 mL  30 mL Oral Daily PRN Aldean Baker, NP      . menthol-cetylpyridinium (CEPACOL) lozenge 3 mg  1 lozenge Oral PRN Jaclyn Shaggy, PA-C      . metoprolol succinate (TOPROL-XL) 24 hr tablet 100 mg  100 mg Oral Daily Aldean Baker, NP   100 mg at 10/24/20 5809  .  mirtazapine (REMERON) tablet 15 mg  15 mg Oral QHS Eliseo Gum B, MD   15 mg at 10/23/20 2204  . multivitamin with minerals tablet 1 tablet  1 tablet Oral Daily Aldean Baker, NP   1 tablet at 10/23/20 1706  . nicotine (NICODERM CQ - dosed in mg/24 hours) patch 21 mg  21 mg Transdermal Daily Armandina Stammer I, NP   21 mg at 10/24/20 0808  . QUEtiapine (SEROQUEL) tablet 100 mg  100 mg Oral QHS Eliseo Gum B, MD   100 mg at 10/23/20 2204  . sodium chloride (OCEAN) 0.65 % nasal spray 1 spray  1 spray Each Nare PRN Melbourne Abts W, PA-C      . thiamine tablet 100 mg  100 mg Oral Daily Aldean Baker, NP   100 mg at 10/23/20 1706   PTA Medications: Medications Prior to Admission  Medication Sig Dispense Refill Last Dose  . ARIPiprazole (ABILIFY) 10 MG tablet Take 1 tablet (10 mg total) by mouth daily. (Patient not taking: Reported on 10/16/2020) 30 tablet 2   . bictegravir-emtricitabine-tenofovir AF (BIKTARVY) 50-200-25 MG TABS tablet Restart med as directed by DR Luciana Axe after appt [phone]) 30 tablet 0   . busPIRone (BUSPAR) 15 MG tablet Take 1 tablet (  15 mg total) by mouth 3 (three) times daily. (Patient not taking: Reported on 10/16/2020) 90 tablet 2   . ibuprofen (ADVIL) 200 MG tablet Take 800 mg by mouth every 6 (six) hours as needed for mild pain or headache.     . Melatonin 3 MG CAPS Take 6 mg by mouth at bedtime as needed for sleep.     . metoprolol succinate (TOPROL XL) 100 MG 24 hr tablet Take 1 tablet (100 mg total) by mouth daily. Take with or immediately following a meal. 30 tablet 11   . nicotine (NICODERM CQ - DOSED IN MG/24 HOURS) 21 mg/24hr patch Place 21 mg onto the skin daily.       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  Therapeutic Interventions: 1 to 1 sessions, Unit Group sessions and Medication administration.  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) Active Problems:   HIV disease (HCC)   Tobacco use disorder   Cocaine abuse (HCC)   ETOH abuse   Genital warts   Anxiety disorder, unspecified  Long Term Goal(s): Improvement in symptoms so as ready for discharge Improvement in symptoms so as ready for discharge   Short Term Goals: Ability to identify changes in lifestyle to reduce recurrence of condition will improve Ability to demonstrate self-control will improve Ability to identify triggers associated with substance abuse/mental health issues will improve     Medication Management: Evaluate patient's response, side effects, and tolerance of medication regimen.  Therapeutic Interventions: 1 to 1 sessions, Unit Group sessions and Medication administration.  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 RN will report any adverse and/or side effects to prescribing provider.  Therapeutic Interventions: 1 on 1 counseling sessions, Psychoeducation, Medication administration, Evaluate responses to treatment, Monitor vital signs and CBGs as ordered, Perform/monitor CIWA, COWS, AIMS and Fall Risk screenings as ordered, Perform wound care treatments as ordered.  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  Short Term Goals: Engage patient in aftercare planning with referrals and resources, Facilitate patient progression through stages  of change regarding substance use diagnoses and concerns and Identify triggers associated with mental health/substance abuse issues  Therapeutic Interventions: Assess for all discharge needs, 1 to 1 time with Social worker, Explore available resources and support systems, Assess for adequacy in community support network, Educate family and significant other(s) on suicide prevention, Complete Psychosocial Assessment, Interpersonal group therapy.  Evaluation of Outcomes: Progressing   Progress in Treatment: Attending groups: No. Participating in groups: No. Taking medication as prescribed: Yes. Toleration medication: Yes. Family/Significant other contact made: No, will contact:  Pt declined consents Patient understands diagnosis: Yes. Discussing patient identified problems/goals with staff: Yes. Medical  problems stabilized or resolved: Yes. Denies suicidal/homicidal ideation: Yes. Issues/concerns per patient self-inventory: No. Other: None  New problem(s) identified: No, Describe:  none  New Short Term/Long Term Goal(s):medication stabilization, elimination of SI thoughts, development of comprehensive mental wellness plan.  Patient Goals:   "be less stressed and more motivated"  Discharge Plan or Barriers: Patient is to discharge to [facility] residential [facility].   Reason for Continuation of Hospitalization: Medication stabilization  Estimated Length of Stay: 1-3 days  Attendees: Patient:  10/24/2020   Physician:  10/24/2020   Nursing:  10/24/2020   RN Care Manager: 10/24/2020   Social Worker: Ruthann Cancer, LCSW 10/24/2020   Recreational Therapist:  10/24/2020   Other:  10/24/2020   Other:  10/24/2020   Other: 10/24/2020       Scribe for Treatment Team: Otelia Santee, LCSW 10/24/2020 9:54 AM

## 2020-10-24 NOTE — Progress Notes (Addendum)
[facility]  10/24/2020 10:23 AM Henry [name]  MRN:  [id number] Subjective:  This AM patient reports that he he slept very well and does not endorse AVH overnight or this AM. Patient also reports that once he took a QHS Vistaril he did not have any pruritis overnight and continues to feel well this AM. Patient reports that his mood continues to improve and he is very relieved to have finally had a good night of rest. Patient reports that he was also able to eat mindfully yesterday and recognizes that he may have been overeating the past few days because he has not been eating well at home. Patient contracts for safety and does not endorse SI, HI, nor AVH. Patient continues to report that he remains invested in his rehabilitation. 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 Anxiety   . Bipolar 1 disorder (HCC)   . HIV (human immunodeficiency virus infection) (HCC)    History reviewed. No pertinent surgical history. Family History:  Family History  Problem Relation Age of Onset  . Thyroid disease Mother   . Heart disease Maternal Grandmother   . Breast cancer Maternal Grandmother    Family Psychiatric  History: See H&P Social History:  Social History   Substance and Sexual Activity  Alcohol Use Yes  . Alcohol/week: 0.0 standard drinks   Comment: 2 a day off and on     Social History   Substance and Sexual Activity  Drug Use Yes  . Types: Marijuana, Cocaine, Amphetamines, Benzodiazepines, "Crack" cocaine    Social History   Socioeconomic  History  . Marital status: Single    Spouse name: Not on file  . Number of children: Not on file  . Years of education: Not on file  . Highest education level: Not on file  Occupational History  . Not on file  Tobacco Use  . Smoking status: Current Every Day Smoker    Packs/day: 1.00    Years: 14.00    Pack years: 14.00    Types: Cigarettes  . Smokeless tobacco: Never Used  . Tobacco comment: trying to cut back  Vaping Use  . Vaping Use: Never used  Substance and Sexual Activity  . Alcohol use: Yes    Alcohol/week: 0.0 standard drinks    Comment: 2 a day off and on  . Drug use: Yes    Types: Marijuana, Cocaine, Amphetamines, Benzodiazepines, "Crack" cocaine  . Sexual activity: Yes    Partners: Male    Birth control/protection: None, Condom  Other Topics Concern  . Not on file  Social History Narrative  . Not on file   Social Determinants of Health   Financial Resource Strain: Not on file  Food Insecurity: Not on file  Transportation Needs: Not on file  Physical Activity: Not on file  Stress: Not on file  Social Connections: Not on file   Additional Social History:                         Sleep: Good  Appetite:  Fair  Current Medications: Current Facility-Administered Medications  Medication Dose Route Frequency Provider Last Rate Last Admin  . acetaminophen (TYLENOL) tablet 650 mg  650 mg Oral Q6H PRN Aldean Baker, NP   650 mg at 10/21/20 0807  . alum & mag hydroxide-simeth (MAALOX/MYLANTA) 200-200-20 MG/5ML suspension 30 mL  30 mL Oral Q4H PRN Aldean Baker, NP      . bictegravir-emtricitabine-tenofovir AF (BIKTARVY) 50-200-25 MG per tablet 1 tablet  1 tablet Oral Daily Aldean Baker, NP   1 tablet at 10/24/20 5035  . busPIRone (BUSPAR) tablet 15 mg  15 mg Oral TID Eliseo Gum B, MD   15 mg at 10/24/20 0807  . hydrocortisone cream 1 %   Topical BID Bobbye Morton, MD   Given at 10/24/20 [phone]  . hydrOXYzine (ATARAX/VISTARIL) tablet 25 mg  25 mg  Oral Q8H PRN Comer Locket, MD   25 mg at 10/24/20 0816  . ibuprofen (ADVIL) tablet 600 mg  600 mg Oral Q6H PRN Jaclyn Shaggy, PA-C   600 mg at 10/24/20 0813  . imiquimod (ALDARA) 5 % cream   Topical Once per day on Mon Wed Fri Singleton, Amy E, MD   Given at 10/21/20 2150  . lidocaine (XYLOCAINE) 5 % ointment   Topical TID PRN Jaclyn Shaggy, PA-C   Given at 10/24/20 8127  . magnesium hydroxide (MILK OF MAGNESIA) suspension 30 mL  30 mL Oral Daily PRN Aldean Baker, NP      . menthol-cetylpyridinium (CEPACOL) lozenge 3 mg  1 lozenge Oral PRN Jaclyn Shaggy, PA-C      . metoprolol succinate (TOPROL-XL) 24 hr tablet 100 mg  100 mg Oral Daily Aldean Baker, NP   100 mg at 10/24/20 5170  . mirtazapine (REMERON) tablet 15 mg  15 mg Oral QHS Eliseo Gum B, MD   15 mg at 10/23/20 2204  . multivitamin with minerals tablet 1 tablet  1 tablet Oral Daily Aldean Baker, NP   1 tablet at 10/23/20 1706  . nicotine (NICODERM CQ - dosed in mg/24 hours) patch 21 mg  21 mg Transdermal Daily Armandina Stammer I, NP   21 mg at 10/24/20 0808  . QUEtiapine (SEROQUEL) tablet 100 mg  100 mg Oral QHS Eliseo Gum B, MD   100 mg at 10/23/20 2204  . sodium chloride (OCEAN) 0.65 % nasal spray 1 spray  1 spray Each Nare PRN Jaclyn Shaggy, PA-C      . thiamine tablet 100 mg  100 mg Oral Daily Aldean Baker, NP   100 mg at 10/23/20 1706    Lab Results: No results found for this or any previous visit (from the past 48 hour(s)).  Blood Alcohol level:  Lab Results  Component Value Date   ETH 23 (H) 10/16/2020   ETH <10 09/05/2018    Metabolic Disorder Labs: Lab Results  Component Value Date   HGBA1C 5.4 10/18/2020   MPG 108 10/18/2020   No results found for: PROLACTIN Lab Results  Component Value Date   CHOL 121 10/18/2020   TRIG 164 (H) 10/18/2020   HDL 25 (L) 10/18/2020   CHOLHDL 4.8 10/18/2020   VLDL 33 10/18/2020   LDLCALC 63 10/18/2020   LDLCALC 64 04/06/2019    Physical Findings: AIMS: Facial  and Oral Movements Muscles of Facial Expression: None, normal Lips and Perioral Area: None, normal Jaw: None, normal Tongue: None, normal,Extremity Movements Upper (arms, wrists, hands,  fingers): None, normal Lower (legs, knees, ankles, toes): None, normal, Trunk Movements Neck, shoulders, hips: None, normal, Overall Severity Severity of abnormal movements (highest score from questions above): None, normal Incapacitation due to abnormal movements: None, normal Patient's awareness of abnormal movements (rate only patient's report): No Awareness, Dental Status Current problems with teeth and/or dentures?: No Does patient usually wear dentures?: No  CIWA:  CIWA-Ar Total: 0 COWS:     Musculoskeletal: Strength & Muscle Tone: within normal limits Gait & Station: normal Patient leans: N/A  Psychiatric Specialty Exam: Physical Exam Constitutional:      Appearance: Normal appearance.  HENT:     Head: Normocephalic and atraumatic.  Pulmonary:     Effort: Pulmonary effort is normal.  Neurological:     Mental Status: He is alert.     Review of Systems  Cardiovascular: Negative for chest pain.  Gastrointestinal: Negative for abdominal pain.  Skin:       No pruititis this AM    Blood pressure (!) 124/95, pulse 90, temperature (!) 97.3 F (36.3 C), temperature source Oral, resp. rate 18, height 6\' 1"  (1.854 m), weight 88.5 kg, SpO2 99 %.Body mass index is 25.73 kg/m.  General Appearance: Fairly Groomed  Eye Contact:  Good  Speech:  Clear and Coherent  Volume:  Normal  Mood:  Euthymic  Affect:  Appropriate  Thought Process:  Coherent  Orientation:  NA  Thought Content:  Logical  Suicidal Thoughts:  No  Homicidal Thoughts:  No  Memory:  Recent;   Fair  Judgement:  Other:  Improving  Insight:  Improving  Psychomotor Activity:  Normal  Concentration:  Concentration: Fair  Recall:  NA  Fund of Knowledge:  Good  Language:  Good  Akathisia:  No    AIMS (if indicated):      Assets:  Communication Skills Desire for Improvement Leisure Time Resilience  ADL's:  Intact  Cognition:  WNL  Sleep:  Number of Hours: 3.75     Treatment Plan Summary: Daily contact with patient to assess and evaluate symptoms and progress in treatment Henry [name] is a 41 yo patientwith a hx of Bipolar 1 disorder, EtOH use disorder, Cocaine use disorder, and GAD. Patient reported diffuse pruritis yesterday of undetermined source and was also reporting AH that night; however on exam today both have resolved. Will continue to monitor patient as the itching started 48h after the onset of Seroquel, but it is unlikely this is a side effect. Patient has been approved a bed at [facility] and will be discharged Wed AM. It is not in the best interest of patient to discharge him home as patient lives in drug house and his medications can be easily stolen or sold and patient will be at high risk for relapse. Patient mood continues to improve and patient continues to endorse that he is working towards rehabilitation and is noted to interact well on the unit and attends groups.  Diagnoses / Active Problems: Bipolar II MRE depressed, severe with psychotic features Unspecified anxiety (r/o PTSD) HIV Anal warts  PLAN: 1. Safety and Monitoring: -- Voluntary admission to inpatient psychiatric unit for safety, stabilization and treatment -- Daily contact with patient to assess and evaluate symptoms and progress in treatment -- Patient's case to be discussed in multi-disciplinary team meeting -- Observation Level : q15 minute checks -- Vital signs: q12 hours -- Precautions: suicide  2. Psychiatric Diagnoses and Treatment: Bipolar II MRE depressed, severe with psychotic features Unspecified anxiety d/o (r/o PTSD) --  Continue Remeron 15mg  qhs for anxiety and depression -- Continue Seroquel 100mg  qhs for psychotic sx and augmentation  of mood -- Continue Buspar 15mg  tid -- PRN Vistaril 25mg  q6 hours PRN anxiety -- Metabolic profile and EKG monitoring obtained while on an atypical antipsychotic (BMI:25.73Lipid Panel: cholesterol 121, HDL 25, LDL 63, triglycerides 164;HbgA1c:5.4;QTc:441)  -- Encouraged patient to participate in unit milieu and in scheduled group therapies  -- Short Term Goals:Ability to identify changes in lifestyle to reduce recurrence of condition will improve and Ability to demonstrate self-control will improve -- Long Term Goals: Improvement in symptoms so as ready for discharge  Stimulant use disorder - cocaine type Alcohol use d/o -- Continue CIWA protocol with thiamine and MVI oral replacement (recent CIWA scores 1, 0) -- Social work has made referrals for residential rehab programs and awaiting open bed -- Short Term Goals: Ability to identify triggers associated with substance abuse/mental health issues will improve -- Long Term Goals: Improvement in symptoms so as ready for discharge  3. Medical Issues Being Addressed:  Tobacco Use Disorder -- Nicotine patch 21mg /24 hours ordered -- Smoking cessation encouraged  HIV -- Continue Biktarvy daily -- will need f/u with ID after discharge; likely at [facility]  Anal warts -- Continue Aldara cream three times weekly -- Continue Lidocaine topical ointment PRN -- will need f/u with ID after discharge; likely w/ [facility]  HTN -- Continue Toprol XL 100mg  daily and monitoring BP(BP today 124/95)              Puriritis             -- Vistaril 25mg  q6 hours PRN and hydrocortisone cream topically PRN  4. Discharge Planning:  -- Social work and case management to  assist with discharge planning and identification of hospital follow-up needs prior to discharge -- Estimated LOS: 2 day -- Discharge Concerns: Need to establish a safety plan; Medication compliance and effectiveness -- Discharge Goals: Return home with outpatient referrals for mental health follow-up including medication management/psychotherapy PGY-1 , MD 10/24/2020, 10:23 AM

## 2020-10-24 NOTE — BHH Group Notes (Addendum)
Occupational Therapy Group Note Date: 10/24/2020 Group Topic/Focus: Socialization/Social Skills  Group Description: Group encouraged increased engagement and participation through discussion focused on "Breaking Down Barriers." Patients were given a handout to fill out and engage in a structured discussion around barrier to treatment including grief, guilt, substance use, etc. Discussion focused on strategies to combat barriers identified.   Participation Level: Patient did not attend OT group session despite personal invitation.    Plan: Continue to engage patient in OT groups 2 - 3x/week.  10/24/2020  Shariece Viveiros, MOT, OTR/L  

## 2020-10-24 NOTE — Progress Notes (Signed)
D: Patient's self inventory sheet, patient sleeps good, sleep medication helpful.  Good appetite, normal energy level,.  Rated depression, hopeless and anxiety 4.  Denied withdrawals.  Denied SI.  Physical problems, rash, backside, worst pain #6 in past 24 hours.  Pain medicine helpful.  Goal is stay focused on being healthy.  Plans to practice and participate in group.  Does have discharge plans. A:  Medications administrated per MD orders.  Emotional support and encouragement given patient. R:  Denied SI and HI, contracts for safety.  Denied A/V hallucinations.  Safety maintained with 15 minute checks.

## 2020-10-24 NOTE — BHH Counselor (Signed)
Pt was contacted by Ms. June at [facility] and learned that pt has been accepted for residential treatment for 10/26/20 at [facility]. Physician Notified.   Fredirick Lathe, LCSWA Clinicial Social Worker [employer]

## 2020-10-24 NOTE — BHH Group Notes (Signed)
The focus of this group is to help patients establish daily goals to achieve during treatment and discuss how the patient can incorporate goal setting into their daily lives to aide in recovery.   Patient attended and contributed to group. 

## 2020-10-24 NOTE — Plan of Care (Signed)
Nurse discussed anxiety, depression and coping skills with patient.  

## 2020-10-24 NOTE — Progress Notes (Signed)
The patient rated his day as a 7 out of 10 since he found out that he will be discharged to [facility] in a few days. His goal for tomorrow is to "stay in the present".

## 2020-10-24 NOTE — Progress Notes (Signed)
Recreation Therapy Notes  Date: 2.21.22 Time: 0930 Location: 300 Hall Group Room  Group Topic: Stress Management   Goal Area(s) Addresses:  Patient will actively participate in stress management techniques presented during session.   Intervention: Stress management techniques  Activity: Guided Imagery.  LRT read [name] script that took patients on [name] journey to enjoy the sights, sounds and adventure of what the forest has to offer.  Patients were to sit quietly, focus on breathing and imagine themselves in the peaceful wonders of the forest.  Education:  Stress Management, Discharge Planning.   Education Outcome: Acknowledges education  Clinical Observations/Feedback: Patient did not attend group session.    Henry [name], LRT/CTRS         Henry [name] 10/24/2020 11:21 AM 

## 2020-10-24 NOTE — Progress Notes (Signed)
Pt shares that he has noticed an improvement in his mood. He said that he feels "hopeful" now in comparison to when he was first admitted, he felt like there was no way to fix how he was feeling. Pt acknowledges that it will continue to take time. He has been working on his discharge planning. Pt said that he called [facility] today to complete his screening and was informed that they do have a bed available there for him. He said that he expects to be discharged on Wednesday. He also called his mother and sister to ask for them to arrange some things for him like clothing. Pt has been pleasant on the unit and he did attend group last night. He did complain of his back being itchy. His left lower back was slightly red, but no rash was observed. He requested his hydrocortisone cream which was originally ordered for bid. Informed Melbourne Abts, PA who changed the order to be X1 PRN as well and it was administered at 2227. Pt said that he has noticed an improvement in his genital warts as well and hasn't been feeling much discomfort anymore. Pt denies SI/HI and AVH. Active listening, reassurance, and support provided. Medications administered as ordered by provider. Q 15 min safety checks continue. Pt's safety has been maintained.   10/24/20 2114  Psych Admission Type (Psych Patients Only)  Admission Status Voluntary  Psychosocial Assessment  Patient Complaints Anxiety  Eye Contact Fair  Facial Expression Animated;Anxious  Affect Anxious;Appropriate to circumstance  Speech Logical/coherent;Soft  Interaction Assertive  Motor Activity Other (Comment) (WDL)  Appearance/Hygiene Unremarkable  Behavior Characteristics Cooperative;Anxious  Mood Anxious;Pleasant  Thought Process  Coherency WDL  Content WDL  Delusions None reported or observed  Perception WDL  Hallucination None reported or observed  Judgment WDL  Confusion None  Danger to Self  Current suicidal ideation? Denies  Self-Injurious Behavior No  self-injurious ideation or behavior indicators observed or expressed   Danger to Others  Danger to Others None reported or observed

## 2020-10-25 DIAGNOSIS — F3181 Bipolar II disorder: Principal | ICD-10-CM

## 2020-10-25 MED ORDER — MIRTAZAPINE 15 MG PO TABS: 15.0000 mg | ORAL_TABLET | Freq: Every day | ORAL | 0 refills | Status: DC

## 2020-10-25 MED ORDER — IBUPROFEN 600 MG PO TABS: 600.0000 mg | ORAL_TABLET | Freq: Four times a day (QID) | ORAL | 0 refills | Status: DC | PRN

## 2020-10-25 MED ORDER — HYDROCORTISONE 1 % EX CREA: TOPICAL_CREAM | Freq: Two times a day (BID) | CUTANEOUS | 0 refills | Status: DC

## 2020-10-25 MED ORDER — QUETIAPINE FUMARATE 100 MG PO TABS: 100.0000 mg | ORAL_TABLET | Freq: Every day | ORAL | 0 refills | Status: DC

## 2020-10-25 MED ORDER — BICTEGRAVIR-EMTRICITAB-TENOFOV 50-200-25 MG PO TABS: 1.0000 | ORAL_TABLET | Freq: Every day | ORAL | 0 refills | Status: DC

## 2020-10-25 MED ORDER — LIDOCAINE 5 % EX OINT: TOPICAL_OINTMENT | Freq: Three times a day (TID) | CUTANEOUS | 0 refills | Status: DC | PRN

## 2020-10-25 MED ORDER — HYDROXYZINE HCL 25 MG PO TABS: 25.0000 mg | ORAL_TABLET | Freq: Three times a day (TID) | ORAL | 0 refills | Status: DC | PRN

## 2020-10-25 MED ORDER — METOPROLOL SUCCINATE ER 100 MG PO TB24: 100.0000 mg | ORAL_TABLET | Freq: Every day | ORAL | 0 refills | Status: DC

## 2020-10-25 MED ORDER — IMIQUIMOD 5 % EX CREA: TOPICAL_CREAM | CUTANEOUS | 0 refills | Status: DC

## 2020-10-25 NOTE — BHH Suicide Risk Assessment (Signed)
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 rate 18, height 6\' 1"  (1.854 m), weight 88.5 kg, SpO2 99 %.Body mass index is 25.73 kg/m.  General Appearance: Casual  Eye Contact::  Fair  Speech:  Clear and Coherent  Volume:  Normal  Mood:  Euthymic  Affect:  Appropriate  Thought Process:  Coherent  Orientation:  Full (Time, Place, and Person)  Thought Content:  Logical  Suicidal Thoughts:  No  Homicidal Thoughts:  No  Memory:  Recent;   Fair  Judgement:  Fair  Insight:  Fair  Psychomotor Activity:  Normal  Concentration:  Fair  Recall:  [id number] of Knowledge:Fair  Language: Fair  Akathisia:  No  Handed:  Right  AIMS (if indicated):     Assets:  Communication Skills Desire for Improvement Resilience Social Support Talents/Skills  Sleep:  Number of Hours: 6  Cognition: WNL  ADL's:  Intact   Mental Status Per Nursing Assessment::   On Admission:  Suicidal ideation indicated by patient  Demographic Factors:  Male, Gay, lesbian, or bisexual orientation and Low socioeconomic status  Loss Factors: Decline in physical health  Historical Factors: Impulsivity  Risk Reduction Factors:   Religious beliefs about death, Positive social support, Positive therapeutic relationship and Positive coping skills or problem solving skills  Continued Clinical Symptoms:   Alcohol/Substance Abuse/Dependencies  Cognitive Features That Contribute To Risk:  None    Suicide Risk:  Moderate:  Frequent suicidal ideation with limited intensity, and duration, some specificity in terms of plans, no associated intent, good self-control, limited dysphoria/symptomatology, some risk factors present, and identifiable protective factors, including available and accessible social support.   Follow-up Information    [facility]. Go on 11/17/2020.   Why: You have a hospital follow up appointment to establish care for primary care services on 11/17/20 at 2:10 pm.   This appointment will be held in person.  Please arrive 15 minutes prior to your appointment. Contact information: [address] 11/19/20 [facility] [phone]       [facility] Follow up.   Why: You have been accepted here for residential treatment on 10/26/20 at 9am.   Contact information: [address] [place] [phone]               Plan Of Care/Follow-up recommendations:  Other:  Follow-up with outpatient care  [phone], MD 10/25/2020, 3:45 PM

## 2020-10-25 NOTE — Progress Notes (Signed)
  [facility] Adult Case Management Discharge Plan :  Will you be returning to the same living situation after discharge:  No. To [facility] At discharge, do you have transportation home?: Yes,  Safe Transport Do you have the ability to pay for your medications: Yes,  [facility]  Release of information consent forms completed and in the chart;  Patient's signature needed at discharge.  Patient to Follow up at:  Follow-up Information    [facility]. Go on 11/17/2020.   Why: You have a hospital follow up appointment to establish care for primary care services on 11/17/20 at 2:10 pm.   This appointment will be held in person.  Please arrive 15 minutes prior to your appointment. Contact information: [address] [employer] [facility] [phone]       [facility] Follow up.   Why: You have been accepted here for residential treatment on 10/26/20 at 9am.   Contact information: [address] [place] [address] [phone]               Next level of care provider has access to [facility] Link:yes  Safety Planning and Suicide Prevention discussed: Yes,  w/ pt  Have you used any form of tobacco in the last 30 days? (Cigarettes, Smokeless Tobacco, Cigars, and/or Pipes): Yes  Has patient been referred to the Quitline?: Patient refused referral  Patient has been referred for addiction treatment: Yes  Felizardo Hoffmann, LCSWA 10/25/2020, 3:07 PM

## 2020-10-25 NOTE — Progress Notes (Signed)
Recreation Therapy Notes  Animal-Assisted Activity (AAA) Program Checklist/Progress Notes Patient Eligibility Criteria Checklist & Daily Group note for Rec Tx Intervention  Date: 2.22.22 Time: 1430 Location: [address]   AAA/T Program Assumption of Risk Form signed by Patient/ or Parent Legal Guardian YES   Patient is free of allergies or severe asthma YES  Patient reports no fear of animals YES   Patient reports no history of cruelty to animals YES  Patient understands his/her participation is voluntary YES  Patient washes hands before animal contact YES   Patient washes hands after animal contact YES   Education: Charity fundraiser, Appropriate Animal Interaction   Education Outcome: Acknowledges understanding/In group clarification offered/Needs additional education.   Clinical Observations/Feedback: Pt did not attend group activity.    Caroll Rancher, LRT/CTRS         Caroll Rancher A 10/25/2020 4:02 PM

## 2020-10-25 NOTE — BHH Group Notes (Signed)
  Type of Therapy and Topic:  Group Therapy - Healthy vs Unhealthy Coping Skills  Participation Level:  Active   Description of Group The focus of this group was to determine what unhealthy coping techniques typically are used by group members and what healthy coping techniques would be helpful in coping with various problems. Patients were guided in becoming aware of the differences between healthy and unhealthy coping techniques. Patients were asked to identify 2-3 healthy coping skills they would like to learn to use more effectively.  Therapeutic Goals 1. Patients learned that coping is what human beings do all day long to deal with various situations in their lives 2. Patients defined and discussed healthy vs unhealthy coping techniques 3. Patients identified their preferred coping techniques and identified whether these were healthy or unhealthy 4. Patients determined 2-3 healthy coping skills they would like to become more familiar with and use more often. 5. Patients provided support and ideas to each other  Therapeutic Modalities Cognitive Behavioral Therapy Motivational Interviewing  Sawsan Riggio, LCSWA Clinicial Social Worker [facility] 

## 2020-10-25 NOTE — [facility] Group Notes (Signed)
Adult Psychoeducational Group Note  Date:  10/25/2020 Time:  11:04 PM  Group Topic/Focus:  Wrap-Up Group:   The focus of this group is to help patients review their daily goal of treatment and discuss progress on daily workbooks.  Participation Level:  Active  Participation Quality:  Attentive  Affect:  Appropriate  Cognitive:  Alert  Insight: Good  Engagement in Group:  Engaged  Modes of Intervention:  Discussion  Additional Comments  Jacalyn Lefevre 10/25/2020, 11:04 PM

## 2020-10-25 NOTE — Progress Notes (Signed)
[facility] MD Progress Note  10/25/2020 10:58 AM Henry [name]  MRN:  [id number] Subjective:  On exam today patient reports that his mood continues to improve. Patient reports that he slept very well last night and does not endorse AVH. Patient reports that he did wake up with some itching on his back, but he felt better after showering and applying lotion. Patient reports that he also ate well yesterday and was able to have "mindful eating." Patient reports that he is excited for his transition to [facility] tomorrow and he will continue to attend groups in the hospital as he has found them beneficial. Patient denies SI, HI, and AVH. 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 Anxiety   . Bipolar 1 disorder (HCC)   . HIV (human immunodeficiency virus infection) (HCC)    History reviewed. No pertinent surgical history. Family History:  Family History  Problem Relation Age of Onset  . Thyroid disease Mother   . Heart disease Maternal Grandmother   . Breast cancer Maternal Grandmother    Family Psychiatric  History: See H&P Social History:  Social History   Substance and Sexual Activity  Alcohol Use Yes  . Alcohol/week: 0.0 standard drinks   Comment: 2 a day off and on     Social History   Substance and Sexual Activity  Drug Use Yes  . Types: Marijuana, Cocaine, Amphetamines, Benzodiazepines, "Crack" cocaine    Social History   Socioeconomic History  . Marital status: Single    Spouse name: Not on file  . Number of children: Not on file  . Years of  education: Not on file  . Highest education level: Not on file  Occupational History  . Not on file  Tobacco Use  . Smoking status: Current Every Day Smoker    Packs/day: 1.00    Years: 14.00    Pack years: 14.00    Types: Cigarettes  . Smokeless tobacco: Never Used  . Tobacco comment: trying to cut back  Vaping Use  . Vaping Use: Never used  Substance and Sexual Activity  . Alcohol use: Yes    Alcohol/week: 0.0 standard drinks    Comment: 2 a day off and on  . Drug use: Yes    Types: Marijuana, Cocaine, Amphetamines, Benzodiazepines, "Crack" cocaine  . Sexual activity: Yes    Partners: Male    Birth control/protection: None, Condom  Other Topics Concern  . Not on file  Social History Narrative  . Not on file   Social Determinants of Health   Financial Resource Strain: Not on file  Food Insecurity: Not on file  Transportation Needs: Not on file  Physical Activity: Not on file  Stress: Not on file  Social Connections: Not on file   Additional Social History:                         Sleep: Good  Appetite:  Good  Current Medications: Current Facility-Administered Medications  Medication Dose Route Frequency Provider Last Rate Last Admin  . acetaminophen (TYLENOL) tablet  650 mg  650 mg Oral Q6H PRN Aldean Baker, NP   650 mg at 10/21/20 0272  . alum & mag hydroxide-simeth (MAALOX/MYLANTA) 200-200-20 MG/5ML suspension 30 mL  30 mL Oral Q4H PRN Aldean Baker, NP      . bictegravir-emtricitabine-tenofovir AF (BIKTARVY) 50-200-25 MG per tablet 1 tablet  1 tablet Oral Daily Aldean Baker, NP   1 tablet at 10/25/20 0810  . busPIRone (BUSPAR) tablet 15 mg  15 mg Oral TID Eliseo Gum B, MD   15 mg at 10/25/20 0815  . hydrocortisone cream 1 %   Topical BID Jaclyn Shaggy, PA-C   Given at 10/24/20 2227  . hydrOXYzine (ATARAX/VISTARIL) tablet 25 mg  25 mg Oral Q8H PRN Comer Locket, MD   25 mg at 10/25/20 5366  . ibuprofen (ADVIL) tablet 600 mg  600 mg Oral Q6H  PRN Jaclyn Shaggy, PA-C   600 mg at 10/25/20 4403  . imiquimod (ALDARA) 5 % cream   Topical Once per day on Mon Wed Fri Singleton, Amy E, MD   Given at 10/24/20 2114  . lidocaine (XYLOCAINE) 5 % ointment   Topical TID PRN Jaclyn Shaggy, PA-C   Given at 10/25/20 [phone]  . magnesium hydroxide (MILK OF MAGNESIA) suspension 30 mL  30 mL Oral Daily PRN Aldean Baker, NP      . menthol-cetylpyridinium (CEPACOL) lozenge 3 mg  1 lozenge Oral PRN Jaclyn Shaggy, PA-C      . metoprolol succinate (TOPROL-XL) 24 hr tablet 100 mg  100 mg Oral Daily Aldean Baker, NP   100 mg at 10/25/20 5956  . mirtazapine (REMERON) tablet 15 mg  15 mg Oral QHS Eliseo Gum B, MD   15 mg at 10/24/20 2113  . multivitamin with minerals tablet 1 tablet  1 tablet Oral Daily Aldean Baker, NP   1 tablet at 10/24/20 1645  . nicotine (NICODERM CQ - dosed in mg/24 hours) patch 21 mg  21 mg Transdermal Daily Armandina Stammer I, NP   21 mg at 10/25/20 0815  . QUEtiapine (SEROQUEL) tablet 100 mg  100 mg Oral QHS Eliseo Gum B, MD   100 mg at 10/24/20 2113  . sodium chloride (OCEAN) 0.65 % nasal spray 1 spray  1 spray Each Nare PRN Melbourne Abts W, PA-C      . thiamine tablet 100 mg  100 mg Oral Daily Aldean Baker, NP   100 mg at 10/24/20 1645    Lab Results: No results found for this or any previous visit (from the past 48 hour(s)).  Blood Alcohol level:  Lab Results  Component Value Date   ETH 23 (H) 10/16/2020   ETH <10 09/05/2018    Metabolic Disorder Labs: Lab Results  Component Value Date   HGBA1C 5.4 10/18/2020   MPG 108 10/18/2020   No results found for: PROLACTIN Lab Results  Component Value Date   CHOL 121 10/18/2020   TRIG 164 (H) 10/18/2020   HDL 25 (L) 10/18/2020   CHOLHDL 4.8 10/18/2020   VLDL 33 10/18/2020   LDLCALC 63 10/18/2020   LDLCALC 64 04/06/2019    Physical Findings: AIMS: Facial and Oral Movements Muscles of Facial Expression: None, normal Lips and Perioral Area: None, normal Jaw: None,  normal Tongue: None, normal,Extremity Movements Upper (arms, wrists, hands, fingers): None, normal Lower (legs, knees, ankles, toes): None, normal, Trunk Movements Neck, shoulders, hips: None, normal, Overall Severity Severity of abnormal movements (highest  score from questions above): None, normal Incapacitation due to abnormal movements: None, normal Patient's awareness of abnormal movements (rate only patient's report): No Awareness, Dental Status Current problems with teeth and/or dentures?: No Does patient usually wear dentures?: No  CIWA:  CIWA-Ar Total: 1 COWS:     Musculoskeletal: Strength & Muscle Tone: within normal limits Gait & Station: normal Patient leans: N/A  Psychiatric Specialty Exam: Physical Exam Constitutional:      Appearance: Normal appearance.  HENT:     Head: Normocephalic and atraumatic.  Pulmonary:     Effort: Pulmonary effort is normal.  Neurological:     Mental Status: He is alert.     Review of Systems  Gastrointestinal: Negative for abdominal pain.  Genitourinary:       Anal pain is improving  Neurological: Negative for headaches.    Blood pressure (!) 130/94, pulse 89, temperature 97.7 F (36.5 C), temperature source Oral, resp. rate 18, height 6\' 1"  (1.854 m), weight 88.5 kg, SpO2 99 %.Body mass index is 25.73 kg/m.  General Appearance: Fairly Groomed  Eye Contact:  Good  Speech:  Clear and Coherent  Volume:  Normal  Mood:  Euthymic  Affect:  Appropriate  Thought Process:  Coherent  Orientation:  NA  Thought Content:  Logical  Suicidal Thoughts:  No  Homicidal Thoughts:  No  Memory:  Recent;   Good  Judgement:  Other:  Improving  Insight:  Fair  Psychomotor Activity:  Normal  Concentration:  Concentration: Good  Recall:  NA  Fund of Knowledge:  Good  Language:  Good  Akathisia:  No  Handed:  Right  AIMS (if indicated):     Assets:  Communication Skills Desire for Improvement Resilience  ADL's:  Intact  Cognition:  WNL   Sleep:  Number of Hours: 6     Treatment Plan Summary: Daily contact with patient to assess and evaluate symptoms and progress in treatment Mr. Polo is a 40 yo patientwith a hx of Bipolar 1 disorder, EtOH use disorder, Cocaine use disorder, and GAD. Patient mood continues to improve. Patient is doing well on the unit. Patient mood continues to be stable. Patient continues to have his strange pruritis, but it seems to taken care of by bathing. It is likely that the patient is allergic to what his sheets were washed in.  Patient will be discharged early tom morning to [facility].   Diagnoses / Active Problems: Bipolar II MRE depressed, severe with psychotic features Unspecified anxiety (r/o PTSD) HIV Anal warts  PLAN: 1. Safety and Monitoring: -- Voluntary admission to inpatient psychiatric unit for safety, stabilization and treatment -- Daily contact with patient to assess and evaluate symptoms and progress in treatment -- Patient's case to be discussed in multi-disciplinary team meeting -- Observation Level : q15 minute checks -- Vital signs: q12 hours -- Precautions: suicide  2. Psychiatric Diagnoses and Treatment: Bipolar II MRE depressed, severe with psychotic features Unspecified anxiety d/o (r/o PTSD) -- Continue Remeron 15mg  qhs for anxiety and depression -- Continue Seroquel 100mg  qhs for psychotic sx and augmentation of mood -- Continue Buspar 15mg  tid -- PRN Vistaril 25mg  q6 hours PRN anxiety -- Metabolic profile and EKG monitoring obtained while on an atypical antipsychotic (BMI:25.73Lipid Panel: cholesterol 121, HDL 25, LDL 63, triglycerides 164;HbgA1c:5.4;QTc:441)  -- Encouraged patient to participate in unit milieu and in scheduled group therapies  -- Short Term Goals:Ability to identify changes in lifestyle to reduce recurrence of condition will improve  and Ability to demonstrate  self-control will improve -- Long Term Goals: Improvement in symptoms so as ready for discharge  Stimulant use disorder - cocaine type Alcohol use d/o -- Continue CIWA protocol with thiamine and MVI oral replacement (recent CIWA scores 1, 0) -- Social work has made referrals for residential rehab programs and awaiting open bed -- Short Term Goals: Ability to identify triggers associated with substance abuse/mental health issues will improve -- Long Term Goals: Improvement in symptoms so as ready for discharge  3. Medical Issues Being Addressed:  Tobacco Use Disorder -- Nicotine patch 21mg /24 hours ordered -- Smoking cessation encouraged  HIV -- Continue Biktarvy daily -- will need f/u with ID after discharge; likely at [facility]  Anal warts -- Continue Aldara cream three times weekly -- Continue Lidocaine topical ointment PRN -- will need f/u with ID after discharge; likely w/ [facility]  HTN -- Continue Toprol XL 100mg  daily and monitoring BP(BP today 124/95)  Puriritis -- Vistaril 25mg  q6 hours PRN and hydrocortisone cream topically PRN  4. Discharge Planning:  -- Social work and case management to assist with discharge planning and identification of hospital follow-up needs prior to discharge -- Estimated LOS: 2 day -- Discharge Concerns: Need to establish a safety plan; Medication compliance and effectiveness -- Discharge Goals: Return home with outpatient referrals for mental health follow-up including medication management/psychotherapy PGY-1 Bobbye MortonJai B Kysean Sweet, MD 10/25/2020, 10:58 AM

## 2020-10-25 NOTE — Progress Notes (Signed)
   10/25/20 0609  Vital Signs  Temp 97.7 F (36.5 C)  Temp Source Oral  Pulse Rate 89  BP (!) 130/94  BP Location Right Arm  BP Method Automatic  Patient Position (if appropriate) Standing    D: Patient denies SI/HI/AVH. Patient rated anxiety 5/10 and depression 4/10. Patient was social with staff. Patient talked about discharge and that he was "mentally preparing for it." A:  Patient took scheduled medicine.  Patient's anxiety was relieved with 25 mg of Vistaril. Patients pain in the groin from genital warts was relieved with lidocaine gel and Advil. Support and encouragement provided Routine safety checks conducted every 15 minutes. Patient  Informed to notify staff with any concerns.   R: Safety maintained.

## 2020-10-26 NOTE — Progress Notes (Signed)
   10/25/20 2056  Psych Admission Type (Psych Patients Only)  Admission Status Voluntary  Psychosocial Assessment  Patient Complaints Anxiety  Eye Contact Fair  Facial Expression Animated;Anxious  Affect Anxious;Appropriate to circumstance  Speech Logical/coherent;Soft  Interaction Assertive  Motor Activity Other (Comment) (WDL)  Appearance/Hygiene Unremarkable  Behavior Characteristics Cooperative  Mood Pleasant  Thought Process  Coherency WDL  Content WDL  Delusions None reported or observed  Perception WDL  Hallucination None reported or observed  Judgment WDL  Confusion None  Danger to Self  Current suicidal ideation? Denies  Self-Injurious Behavior No self-injurious ideation or behavior indicators observed or expressed   Agreement Not to Harm Self Yes  Description of Agreement verbal contract  Danger to Others  Danger to Others None reported or observed

## 2020-10-26 NOTE — Progress Notes (Signed)
Spirituality group facilitated by Wilkie Aye, MDiv, [facility].  Group Description:  Group focused on topic of hope.  Patients participated in facilitated discussion around topic, connecting with one another around experiences and definitions for hope.  Group members engaged with visual explorer photos, reflecting on what hope looks like for them today.  Group engaged in discussion around how their definitions of hope are present today in hospital.   Modalities: Psycho-social ed, Adlerian, Narrative, MI Patient Progress: Pt was present during group  Pt spoke about how the picture of flowers remind him of new life and new beginnings and that there will always be another morning   Leane Para  Counseling Intern @ [facility]

## 2020-10-26 NOTE — Progress Notes (Signed)
RN met with pt and reviewed pt's discharge instructions.  Pt verbalized understanding of discharge instructions and pt did not have any questions. RN reviewed and provided pt with a copy of SRA, AVS and Transition Record.  RN returned pt's belongings to pt. Medication samples were given to pt to bring with him to [facility]. Pt denied SI/HI/AVH and voiced no concerns.  Pt was appreciative of the care pt received at [facility]  Nurse Opal Sidles walked pt to lobby.
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
## 2020-10-28 ENCOUNTER — Telehealth: Payer: Self-pay | Admitting: *Deleted

## 2020-10-28 NOTE — Telephone Encounter (Signed)
Received signed record request from [facility] residential facility. RN called the facility to let them know that we are not the most recent provider for this patient. Per Floydene Flock, RN will disregard this record request. Floydene Flock will ask the patient where he was most recently in care. Andree Coss, RN

## 2020-11-01 ENCOUNTER — Inpatient Hospital Stay (INDEPENDENT_AMBULATORY_CARE_PROVIDER_SITE_OTHER): Payer: Self-pay | Admitting: Primary Care
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
## 2020-11-17 ENCOUNTER — Other Ambulatory Visit: Payer: Self-pay | Admitting: Physician Assistant

## 2020-11-17 ENCOUNTER — Other Ambulatory Visit: Payer: Self-pay

## 2020-11-17 ENCOUNTER — Encounter: Payer: Self-pay | Admitting: Physician Assistant

## 2020-11-17 ENCOUNTER — Ambulatory Visit: Payer: Self-pay | Attending: Physician Assistant | Admitting: Physician Assistant

## 2020-11-17 VITALS — BP 121/72 | HR 66 | Ht 73.0 in | Wt 207.2 lb

## 2020-11-17 DIAGNOSIS — A630 Anogenital (venereal) warts: Secondary | ICD-10-CM

## 2020-11-17 DIAGNOSIS — F1920 Other psychoactive substance dependence, uncomplicated: Secondary | ICD-10-CM

## 2020-11-17 DIAGNOSIS — F1994 Other psychoactive substance use, unspecified with psychoactive substance-induced mood disorder: Secondary | ICD-10-CM

## 2020-11-17 DIAGNOSIS — B20 Human immunodeficiency virus [HIV] disease: Secondary | ICD-10-CM

## 2020-11-17 DIAGNOSIS — F1010 Alcohol abuse, uncomplicated: Secondary | ICD-10-CM

## 2020-11-17 DIAGNOSIS — F1120 Opioid dependence, uncomplicated: Secondary | ICD-10-CM

## 2020-11-17 DIAGNOSIS — F419 Anxiety disorder, unspecified: Secondary | ICD-10-CM

## 2020-11-17 DIAGNOSIS — Z09 Encounter for follow-up examination after completed treatment for conditions other than malignant neoplasm: Secondary | ICD-10-CM

## 2020-11-17 DIAGNOSIS — I10 Essential (primary) hypertension: Secondary | ICD-10-CM

## 2020-11-17 MED ORDER — BUSPIRONE HCL 15 MG PO TABS: 15.0000 mg | ORAL_TABLET | Freq: Three times a day (TID) | ORAL | 2 refills | Status: DC

## 2020-11-17 MED ORDER — BICTEGRAVIR-EMTRICITAB-TENOFOV 50-200-25 MG PO TABS: 1.0000 | ORAL_TABLET | Freq: Every day | ORAL | 3 refills | Status: DC

## 2020-11-17 MED ORDER — IMIQUIMOD 5 % EX CREA: TOPICAL_CREAM | CUTANEOUS | 0 refills | Status: DC

## 2020-11-17 MED ORDER — QUETIAPINE FUMARATE 100 MG PO TABS: 100.0000 mg | ORAL_TABLET | Freq: Every day | ORAL | 3 refills | Status: DC

## 2020-11-17 MED ORDER — HYDROXYZINE HCL 25 MG PO TABS: 25.0000 mg | ORAL_TABLET | Freq: Three times a day (TID) | ORAL | 1 refills | Status: DC | PRN

## 2020-11-17 MED ORDER — MIRTAZAPINE 15 MG PO TABS: 15.0000 mg | ORAL_TABLET | Freq: Every day | ORAL | 3 refills | Status: DC

## 2020-11-17 MED ORDER — METOPROLOL SUCCINATE ER 100 MG PO TB24: 100.0000 mg | ORAL_TABLET | Freq: Every day | ORAL | 3 refills | Status: DC

## 2020-11-17 NOTE — Patient Instructions (Signed)
[place] is the website for narcotics anonymous [employer] (website) or [phone] is the information for alcoholics anonymous Both are free and immediately available for help with alcohol and drug use  

## 2020-11-18 ENCOUNTER — Telehealth: Payer: Self-pay

## 2020-11-18 NOTE — Telephone Encounter (Signed)
Received call today from [facility] requesting recent VL: on patient and to help patient get back in care. Patient has not been seen in office since 2020. Last saw ID team at [facility]. Provided number to their clinic and requested RN follow up with them.

## 2020-11-22 ENCOUNTER — Telehealth: Payer: Self-pay | Admitting: General Practice

## 2020-11-22 NOTE — Telephone Encounter (Signed)
Pt needs to est care with ID for his HIV then all questions can be answered.

## 2020-11-22 NOTE — Telephone Encounter (Signed)
Copied from CRM [id number]. Topic: General - Other >> Nov 18, 2020  4:49 PM Marylen Ponto wrote: Reason for CRM: Henry [name] with [facility] would like to know if the office offers HIV viral load testing. Henry [name] requests call back at [phone]   Posey Rea if this was addressed when she came into the office to pick up his most recent OV note. Please follow up if appropriate or disregard if already addressed.

## 2020-11-23 ENCOUNTER — Telehealth: Payer: Self-pay | Admitting: *Deleted

## 2020-11-23 DIAGNOSIS — B20 Human immunodeficiency virus [HIV] disease: Secondary | ICD-10-CM

## 2020-11-23 DIAGNOSIS — Z113 Encounter for screening for infections with a predominantly sexual mode of transmission: Secondary | ICD-10-CM

## 2020-11-23 DIAGNOSIS — Z79899 Other long term (current) drug therapy: Secondary | ICD-10-CM

## 2020-11-23 NOTE — Telephone Encounter (Signed)
Received another patient information request from [place] at [facility]. RN called her back, explained that the patient was last seen here 08/2019, then transferred to [facility] ID clinic.  I explained that any information I gave her would be inaccurate, that [facility] would have the most up to date information for his treatment. Marcelino Duster confirmed she had [facility] ID clinic's phone and fax number to request records. Patient is absolutely welcome to transfer care back to [facility], and we are able to schedule him with labs and a provider. Per Marcelino Duster at [facility], they will ask him where he would prefer to go for continued care, either [facility] or [facility].  Marcelino Duster also asked for clarification on the difference between AIDS and HIV, as she was not sure about [employer]. RN explained that this would be determined by his most recent lab results from [facility], explained that an AIDS diagnosis is not permanent and could be remedied by taking HIV medication, that patient is not at risk of transmitting HIV through casual contact, only through body fluids. Andree Coss, RN

## 2020-11-23 NOTE — Telephone Encounter (Signed)
[facility] called back to make an appointment to reengage in care. Patient scheduled for labs; orders placed. Andree Coss, RN

## 2020-11-24 ENCOUNTER — Other Ambulatory Visit: Payer: Self-pay

## 2020-11-24 ENCOUNTER — Telehealth: Payer: Self-pay | Admitting: Family Medicine

## 2020-11-24 ENCOUNTER — Encounter: Payer: Self-pay | Admitting: Internal Medicine

## 2020-11-24 ENCOUNTER — Ambulatory Visit: Payer: Self-pay

## 2020-11-24 DIAGNOSIS — Z113 Encounter for screening for infections with a predominantly sexual mode of transmission: Secondary | ICD-10-CM

## 2020-11-24 DIAGNOSIS — Z79899 Other long term (current) drug therapy: Secondary | ICD-10-CM

## 2020-11-24 DIAGNOSIS — B20 Human immunodeficiency virus [HIV] disease: Secondary | ICD-10-CM

## 2020-11-24 NOTE — Telephone Encounter (Signed)
Phyllis the nurse at [facility] called asking for a refill on the Lidocaine 5 %   Send to [facility]  CB#  [phone] Fax [phone]

## 2020-11-25 ENCOUNTER — Telehealth: Payer: Self-pay

## 2020-11-25 LAB — T-HELPER CELL (CD4) - (RCID CLINIC ONLY)
CD4 % Helper T Cell: 21 % — ABNORMAL LOW (ref 33–65)
CD4 T Cell Abs: 507 /uL (ref 400–1790)

## 2020-11-25 NOTE — Telephone Encounter (Signed)
[facility] Patient Advocate Encounter ? ?Insurance verification completed.   ? ?The patient is uninsured and will need patient assistance for medication. ? ?We can complete the application and will need to meet with the patient for signatures and income documentation. ? ?Zorion Nims, CPhT ?Specialty Pharmacy Patient Advocate ?[facility] ?Phone: [phone] ?Fax:  [phone]  ?

## 2020-11-28 ENCOUNTER — Encounter: Payer: Self-pay | Admitting: Family

## 2020-11-28 ENCOUNTER — Ambulatory Visit (INDEPENDENT_AMBULATORY_CARE_PROVIDER_SITE_OTHER): Payer: Self-pay | Admitting: Family

## 2020-11-28 ENCOUNTER — Other Ambulatory Visit: Payer: Self-pay

## 2020-11-28 ENCOUNTER — Other Ambulatory Visit (INDEPENDENT_AMBULATORY_CARE_PROVIDER_SITE_OTHER): Payer: Self-pay

## 2020-11-28 VITALS — BP 109/73 | HR 61 | Wt 204.0 lb

## 2020-11-28 DIAGNOSIS — Z0000 Encounter for general adult medical examination without abnormal findings: Secondary | ICD-10-CM

## 2020-11-28 DIAGNOSIS — A539 Syphilis, unspecified: Secondary | ICD-10-CM

## 2020-11-28 DIAGNOSIS — F3341 Major depressive disorder, recurrent, in partial remission: Secondary | ICD-10-CM

## 2020-11-28 DIAGNOSIS — Z23 Encounter for immunization: Secondary | ICD-10-CM

## 2020-11-28 DIAGNOSIS — B20 Human immunodeficiency virus [HIV] disease: Secondary | ICD-10-CM

## 2020-11-28 LAB — CBC WITH DIFFERENTIAL/PLATELET
Absolute Monocytes: 331 cells/uL (ref 200–950)
Basophils Absolute: 50 cells/uL (ref 0–200)
Basophils Relative: 0.7 %
Eosinophils Absolute: 2254 cells/uL — ABNORMAL HIGH (ref 15–500)
Eosinophils Relative: 31.3 %
HCT: 41.2 % (ref 38.5–50.0)
Hemoglobin: 14.2 g/dL (ref 13.2–17.1)
Lymphs Abs: 2549 cells/uL (ref 850–3900)
MCH: 31.5 pg (ref 27.0–33.0)
MCHC: 34.5 g/dL (ref 32.0–36.0)
MCV: 91.4 fL (ref 80.0–100.0)
Monocytes Relative: 4.6 %
Neutro Abs: 2016 cells/uL (ref 1500–7800)
Neutrophils Relative %: 28 %
RBC: 4.51 10*6/uL (ref 4.20–5.80)
RDW: 13.1 % (ref 11.0–15.0)
Total Lymphocyte: 35.4 %
WBC: 7.2 10*3/uL (ref 3.8–10.8)

## 2020-11-28 LAB — COMPLETE METABOLIC PANEL WITH GFR
AG Ratio: 0.9 (calc) — ABNORMAL LOW (ref 1.0–2.5)
ALT: 8 U/L — ABNORMAL LOW (ref 9–46)
AST: 16 U/L (ref 10–40)
Albumin: 3.9 g/dL (ref 3.6–5.1)
Alkaline phosphatase (APISO): 67 U/L (ref 36–130)
BUN: 10 mg/dL (ref 7–25)
CO2: 27 mmol/L (ref 20–32)
Calcium: 8.9 mg/dL (ref 8.6–10.3)
Chloride: 103 mmol/L (ref 98–110)
Creat: 0.97 mg/dL (ref 0.60–1.35)
GFR, Est African American: 113 mL/min/{1.73_m2} (ref >=60)
GFR, Est Non African American: 97 mL/min/{1.73_m2} (ref >=60)
Globulin: 4.5 g/dL (calc) — ABNORMAL HIGH (ref 1.9–3.7)
Glucose, Bld: 70 mg/dL (ref 65–99)
Potassium: 4.7 mmol/L (ref 3.5–5.3)
Sodium: 137 mmol/L (ref 135–146)
Total Bilirubin: 0.3 mg/dL (ref 0.2–1.2)
Total Protein: 8.4 g/dL — ABNORMAL HIGH (ref 6.1–8.1)

## 2020-11-28 LAB — LIPID PANEL
Cholesterol: 125 mg/dL (ref <200)
HDL: 28 mg/dL — ABNORMAL LOW (ref >=40)
LDL Cholesterol (Calc): 75 mg/dL (calc)
Non-HDL Cholesterol (Calc): 97 mg/dL (calc) (ref <130)
Total CHOL/HDL Ratio: 4.5 (calc) (ref <5.0)
Triglycerides: 141 mg/dL (ref <150)

## 2020-11-28 LAB — FLUORESCENT TREPONEMAL AB(FTA)-IGG-BLD: Fluorescent Treponemal ABS: REACTIVE — AB

## 2020-11-28 LAB — HIV-1 RNA QUANT-NO REFLEX-BLD
HIV 1 RNA Quant: 22 Copies/mL — ABNORMAL HIGH
HIV-1 RNA Quant, Log: 1.33 Log cps/mL — ABNORMAL HIGH

## 2020-11-28 LAB — RPR TITER: RPR Titer: 1:64 {titer} — ABNORMAL HIGH

## 2020-11-28 LAB — RPR: RPR Ser Ql: REACTIVE — AB

## 2020-11-28 MED ORDER — PENICILLIN G BENZATHINE 1200000 UNIT/2ML IM SUSP
1.2000 10*6.[IU] | Freq: Once | INTRAMUSCULAR | Status: AC
  Administered 2020-11-28: 1.2 10*6.[IU] via INTRAMUSCULAR

## 2020-11-28 MED ORDER — LIDOCAINE 5 % EX OINT
TOPICAL_OINTMENT | Freq: Three times a day (TID) | CUTANEOUS | 0 refills | Status: DC | PRN
Start: 2020-11-28 — End: 2020-12-29

## 2020-11-28 NOTE — Assessment & Plan Note (Signed)
Henry [name] is a 40 y/o male with HIV-1 disease diagnosed in 2006/07 with risk factors being MSM. Off medications for 2-3 months prior to starting at [facility] and now taking medications regularly. No signs/symptoms of opportunistic infection. Reviewed lab work and discussed plan of care. Emphasized importance of keeping up with counseling and mental health as he is at significantly high risk for decreased medication adherence with exacerbations. Continue current dose of Biktarvy. Await viral load. Plan for follow up in 1 month or sooner if needed.

## 2020-11-28 NOTE — Assessment & Plan Note (Signed)
   Discussed the importance of safe sexual practice to reduce risk of STI. Condoms declined.   Prenvar updated today.  Dental form completed for referral to [facility] dental clinic.

## 2020-11-28 NOTE — Assessment & Plan Note (Signed)
Henry [name] RPR is positive for syphilis with titer of 1:64. All previous testing has been non-reactive over 1 year ago. Will treat with 2.4 million units of Bicillin today and then weekly for additional 2 weeks. Encouraged safe sexual practice to reduce risk of STI.

## 2020-11-28 NOTE — Progress Notes (Signed)
Brief Narrative   Patient ID: Henry [name], male    DOB: 1981-08-05, 40 y.o.   MRN: [id number]  Henry [name] is a 40 y/o AA male with HIV-1 disease diagnosed with HIV in 2006/2007 with risk factor including MSM. Initial viral load on 07/16/2013 was 63,493 with CD4 nadir or 260. Genotype with K103N resistance (Efavirenz and Nivirapine). No history of opportunistic infections. Previous ART regimens include Stribild, Darrin Luis, and Biktarvy  Subjective:    Chief Complaint  Patient presents with  . HIV Positive/AIDS     HPI:  Henry [name] is a 40 y.o. male with HIV disease and Bipolar 1 disorder last in contact with [facility] office on 08/13/19 with intentions of transferring to [facility] ID. Last seen at [facility] ID on 09/17/19 with good adherence and tolerance to his ART regimen of Biktarvy. Viral load at the time was undetectable and CD4 count of 435. Here today to re-establish care with [facility].   Henry [name] has been taking his Biktarvy as prescribed with no adverse side effects. Was recently intentionally off medications for about a 2-3 month time period secondary to exacerbation of this depression and substance use. Seen at [facility] on 11/17/20 and now currently receiving services from [facility]. Feeling better today with several questions regarding HIV disease. Denies fevers, chills, night sweats, headaches, changes in vision, neck pain/stiffness, nausea, diarrhea, vomiting, lesions or rashes.  Henry [name] has no problems obtaining medication from the pharmacy and was under [employer] program. Depression currently managed with assistance of [facility] and primary care. Has been sober for 1 month now with no recreational or illicit drug use, tobacco use or alcohol consumption. Due for routine dental care. Healthcare maintenance due includes Prevnar. Has received Covid vaccines and is boosted. Declines condoms.   Lab work completed on 11/24/20 with CD4 count of 507 and viral load that is pending. Kidney  function, electrolytes and renal function are within normal limits. RPR positive at 1:64 with previous titer being non-reactive.   No Known Allergies    Outpatient Medications Prior to Visit  Medication Sig Dispense Refill  . bictegravir-emtricitabine-tenofovir AF (BIKTARVY) 50-200-25 MG TABS tablet Take 1 tablet by mouth daily. 30 tablet 3  . busPIRone (BUSPAR) 15 MG tablet Take 1 tablet (15 mg total) by mouth 3 (three) times daily. 90 tablet 2  . hydrOXYzine (ATARAX/VISTARIL) 25 MG tablet Take 1 tablet (25 mg total) by mouth every 8 (eight) hours as needed for anxiety. 60 tablet 1  . metoprolol succinate (TOPROL-XL) 100 MG 24 hr tablet Take 1 tablet (100 mg total) by mouth daily. Take with or immediately following a meal. 30 tablet 3  . mirtazapine (REMERON) 15 MG tablet Take 1 tablet (15 mg total) by mouth at bedtime. 30 tablet 3  . QUEtiapine (SEROQUEL) 100 MG tablet Take 1 tablet (100 mg total) by mouth at bedtime. 30 tablet 3  . lidocaine (XYLOCAINE) 5 % ointment Apply topically 3 (three) times daily as needed (Mild Perianal/anogenital pain). 35.44 g 0  . hydrocortisone cream 1 % Apply topically 2 (two) times daily. 30 g 0  . ibuprofen (ADVIL) 600 MG tablet Take 1 tablet (600 mg total) by mouth every 6 (six) hours as needed (Mild pain/Inflammation of warts in perianal/anogenital area). 30 tablet 0  . imiquimod (ALDARA) 5 % cream Apply topically 3 (three) times a week. 12 each 0  . nicotine (NICODERM CQ - DOSED IN MG/24 HOURS) 21 mg/24hr patch Place 21 mg onto the skin  daily.     No facility-administered medications prior to visit.     Past Medical History:  Diagnosis Date  . Anxiety   . Bipolar 1 disorder (HCC)   . HIV (human immunodeficiency virus infection) (HCC)      No past surgical history on file.   Review of Systems  Constitutional: Negative for appetite change, chills, fatigue, fever and unexpected weight change.  Eyes: Negative for visual disturbance.   Respiratory: Negative for cough, chest tightness, shortness of breath and wheezing.   Cardiovascular: Negative for chest pain and leg swelling.  Gastrointestinal: Negative for abdominal pain, constipation, diarrhea, nausea and vomiting.  Genitourinary: Negative for dysuria, flank pain, frequency, genital sores, hematuria and urgency.  Skin: Negative for rash.  Allergic/Immunologic: Negative for immunocompromised state.  Neurological: Negative for dizziness and headaches.      Objective:    BP 109/73   Pulse 61   Wt 204 lb (92.5 kg)   BMI 26.91 kg/m  Nursing note and vital signs reviewed.  Physical Exam Constitutional:      General: He is not in acute distress.    Appearance: He is well-developed.  Eyes:     Conjunctiva/sclera: Conjunctivae normal.  Cardiovascular:     Rate and Rhythm: Normal rate and regular rhythm.     Heart sounds: Normal heart sounds. No murmur heard. No friction rub. No gallop.   Pulmonary:     Effort: Pulmonary effort is normal. No respiratory distress.     Breath sounds: Normal breath sounds. No wheezing or rales.  Chest:     Chest wall: No tenderness.  Abdominal:     General: Bowel sounds are normal.     Palpations: Abdomen is soft.     Tenderness: There is no abdominal tenderness.  Musculoskeletal:     Cervical back: Neck supple.  Lymphadenopathy:     Cervical: No cervical adenopathy.  Skin:    General: Skin is warm and dry.     Findings: No rash.  Neurological:     Mental Status: He is alert and oriented to person, place, and time.  Psychiatric:        Behavior: Behavior normal.        Thought Content: Thought content normal.        Judgment: Judgment normal.      Depression screen [facility] 2/9 11/28/2020 02/17/2018 05/07/2017 07/14/2015 02/10/2015  Decreased Interest 0 0 3 0 0  Down, Depressed, Hopeless 1 0 3 0 0  PHQ - 2 Score 1 0 6 0 0  Altered sleeping - - 1 - -  Tired, decreased energy - - 1 - -  Change in appetite - - 0 - -  Feeling  bad or failure about yourself  - - 1 - -  Trouble concentrating - - 1 - -  Moving slowly or fidgety/restless - - 0 - -  Suicidal thoughts - - 0 - -  PHQ-9 Score - - 10 - -  Difficult doing work/chores - - Somewhat difficult - -       Assessment & Plan:    Patient Active Problem List   Diagnosis Date Noted  . Syphilis 11/28/2020  . Healthcare maintenance 11/28/2020  . Genital warts 10/21/2020  . Anxiety disorder, unspecified 10/21/2020  . 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 Screening examination for venereal disease 07/14/2015  . Encounter for long-term (current) use of medications 07/14/2015  . Polysubstance dependence including opioid type drug, episodic abuse (  HCC) 09/06/2014  . Major depression, recurrent (HCC) 09/06/2014  . ETOH abuse 09/04/2014  . Major depressive disorder, recurrent episode, moderate (HCC) 09/04/2014  . Generalized anxiety disorder 09/04/2014  . Alcohol abuse 02/16/2014  . Cocaine abuse (HCC) 02/16/2014  . 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 Benzodiazepine abuse (HCC) 02/16/2014  . Substance induced mood disorder (HCC) 02/15/2014  . Tobacco use disorder 08/03/2013  . Depression 08/03/2013  . HIV disease (HCC) 07/20/2013     Problem List Items Addressed This Visit      Other   HIV disease [facility])    Henry [name] is a 40 y/o male with HIV-1 disease diagnosed in 2006/07 with risk factors being MSM. Off medications for 2-3 months prior to starting at [facility] and now taking medications regularly. No signs/symptoms of opportunistic infection. Reviewed lab work and discussed plan of care. Emphasized importance of keeping up with counseling and mental health as he is at significantly high risk for decreased medication adherence with exacerbations. Continue current dose of Biktarvy. Await viral load. Plan for follow up in 1 month or sooner if needed.       Major depression, recurrent [facility])     Henry [name] is improved and currently in care at [facility] for depression and substance use. Has remained sober for the past month and is feeling better. Continue medications per primary are and [facility] recommendations.       Syphilis    Henry [name] RPR is positive for syphilis with titer of 1:64. All previous testing has been non-reactive over 1 year ago. Will treat with 2.4 million units of Bicillin today and then weekly for additional 2 weeks. Encouraged safe sexual practice to reduce risk of STI.       Healthcare maintenance     Discussed the importance of safe sexual practice to reduce risk of STI. Condoms declined.   Prenvar updated today.  Dental form completed for referral to [facility] dental clinic.           I am having Henry [name] maintain his nicotine, ibuprofen, hydrocortisone cream, bictegravir-emtricitabine-tenofovir AF, busPIRone, mirtazapine, metoprolol succinate, hydrOXYzine, QUEtiapine, imiquimod, and lidocaine.   Meds ordered this encounter  Medications  . lidocaine (XYLOCAINE) 5 % ointment    Sig: Apply topically 3 (three) times daily as needed (Mild Perianal/anogenital pain).    Dispense:  35.44 g    Refill:  0    Order Specific Question:   Supervising Provider    Answer:   Judyann Munson [[id number]]     Follow-up: Return in about 1 month (around 12/29/2020), or if symptoms worsen or fail to improve.   Marcos Eke, MSN, FNP-C Nurse Practitioner [facility] Main number: [phone]

## 2020-11-28 NOTE — Assessment & Plan Note (Signed)
Mr. Vonderhaar is improved and currently in care at [facility] for depression and substance use. Has remained sober for the past month and is feeling better. Continue medications per primary are and [facility] recommendations.

## 2020-11-28 NOTE — Patient Instructions (Signed)
Nice to see you.  Continue to take your [place] daily as prescribed.   We will get your financial assistance renewed today.  Will need 2 more weekly injections of 2.4 million units of Bicillin.   Continue other medications per [facility].  Plan for follow up in 1 month or sooner if needed.   Have a great day and stay safe!

## 2020-12-05 ENCOUNTER — Other Ambulatory Visit: Payer: Self-pay

## 2020-12-05 ENCOUNTER — Ambulatory Visit (INDEPENDENT_AMBULATORY_CARE_PROVIDER_SITE_OTHER): Payer: Self-pay

## 2020-12-05 DIAGNOSIS — A539 Syphilis, unspecified: Secondary | ICD-10-CM

## 2020-12-05 MED ORDER — PENICILLIN G BENZATHINE 1200000 UNIT/2ML IM SUSP
1.2000 10*6.[IU] | Freq: Once | INTRAMUSCULAR | Status: AC
  Administered 2020-12-05: 1.2 10*6.[IU] via INTRAMUSCULAR

## 2020-12-12 ENCOUNTER — Other Ambulatory Visit: Payer: Self-pay

## 2020-12-12 ENCOUNTER — Ambulatory Visit (INDEPENDENT_AMBULATORY_CARE_PROVIDER_SITE_OTHER): Payer: Self-pay

## 2020-12-12 DIAGNOSIS — A539 Syphilis, unspecified: Secondary | ICD-10-CM

## 2020-12-12 DIAGNOSIS — B20 Human immunodeficiency virus [HIV] disease: Secondary | ICD-10-CM

## 2020-12-12 MED ORDER — PENICILLIN G BENZATHINE 1200000 UNIT/2ML IM SUSP
1.2000 10*6.[IU] | Freq: Once | INTRAMUSCULAR | Status: AC
Start: 2020-12-12 — End: 2020-12-12
  Administered 2020-12-12: 1.2 10*6.[IU] via INTRAMUSCULAR

## 2020-12-12 MED ORDER — BICTEGRAVIR-EMTRICITAB-TENOFOV 50-200-25 MG PO TABS: 1.0000 | ORAL_TABLET | Freq: Every day | ORAL | 3 refills | Status: DC

## 2020-12-13 ENCOUNTER — Ambulatory Visit: Payer: Self-pay | Admitting: Internal Medicine

## 2020-12-29 ENCOUNTER — Encounter: Payer: Self-pay | Admitting: Family

## 2020-12-29 ENCOUNTER — Ambulatory Visit (INDEPENDENT_AMBULATORY_CARE_PROVIDER_SITE_OTHER): Payer: Self-pay | Admitting: Family

## 2020-12-29 ENCOUNTER — Other Ambulatory Visit: Payer: Self-pay

## 2020-12-29 VITALS — HR 59 | Temp 98.3°F | Wt 202.0 lb

## 2020-12-29 DIAGNOSIS — B20 Human immunodeficiency virus [HIV] disease: Secondary | ICD-10-CM

## 2020-12-29 DIAGNOSIS — Z0000 Encounter for general adult medical examination without abnormal findings: Secondary | ICD-10-CM

## 2020-12-29 DIAGNOSIS — F1120 Opioid dependence, uncomplicated: Secondary | ICD-10-CM

## 2020-12-29 DIAGNOSIS — F1920 Other psychoactive substance dependence, uncomplicated: Secondary | ICD-10-CM

## 2020-12-29 MED ORDER — LIDOCAINE 5 % EX OINT: TOPICAL_OINTMENT | Freq: Three times a day (TID) | CUTANEOUS | 1 refills | Status: DC | PRN

## 2020-12-29 NOTE — Patient Instructions (Signed)
Nice to see you.  Continue to take your medication daily.  Refills have been sent to the pharmacy  Your requested medication changes have been made.   Plan for follow up in 2 months or sooner if needed with lab work on the same day.  Have a great day and stay safe!

## 2020-12-29 NOTE — Progress Notes (Signed)
Brief Narrative   Patient ID: Henry [name], male    DOB: [birth year], 40 y.o.   MRN: [id number]    Subjective:    Chief Complaint  Patient presents with  . Follow-up    At [facility]. Would like to have hydrocortisone changed to PRN, d/c aldara cream      HPI:  Henry [name] is a 40 y.o. male with HIV disease last seen on 11/28/2020 after being off medications for 2 to 3 months and starting medications regularly.  Viral load at the time was undetectable with CD4 count of 507.  RPR titer positive at 1: 64.  Received 3 doses of 2,400,000 units of Bicillin weekly.  Here today for routine follow-up.  Mr. Flight has been taking his Biktarvy daily as prescribed with no adverse side effects or missed doses since his last office visit.  Currently in [facility] rehabilitation.  He is hoping to transfer to [facility] in the near future for longer-term therapy.  Overall feeling well today with no new concerns/complaints. Denies fevers, chills, night sweats, headaches, changes in vision, neck pain/stiffness, nausea, diarrhea, vomiting, lesions or rashes.  Mr. Edmonston has no problems obtaining his medication from the pharmacy.  Denies feelings of being down, depressed, or hopeless recently.  Has now been sober for 2-1/2 months.  Due for routine dental care.  Declines condoms.  No Known Allergies    Outpatient Medications Prior to Visit  Medication Sig Dispense Refill  . bictegravir-emtricitabine-tenofovir AF (BIKTARVY) 50-200-25 MG TABS tablet Take 1 tablet by mouth daily. 30 tablet 3  . busPIRone (BUSPAR) 15 MG tablet TAKE 1 TABLET BY MOUTH 3 TIMES DAILY 90 tablet 2  . hydrocortisone cream 1 % Apply topically 2 (two) times daily. 30 g 0  . hydrOXYzine (ATARAX/VISTARIL) 25 MG tablet TAKE 1 TABLET BY MOUTH EVERY 8 HOURS AS NEEDED FOR ANXIETY. (Patient taking differently: Take by mouth every 8 (eight) hours as needed. for anxiety) 60 tablet 1  . ibuprofen (ADVIL) 600 MG tablet Take 1 tablet (600 mg total)  by mouth every 6 (six) hours as needed (Mild pain/Inflammation of warts in perianal/anogenital area). 30 tablet 0  . metoprolol succinate (TOPROL-XL) 100 MG 24 hr tablet TAKE 1 TABLET BY MOUTH DAILY. TAKE WITH OR IMMEDIATELY FOLLOWING A MEAL. (Patient taking differently: Take by mouth daily. TAKE WITH OR IMMEDIATELY FOLLOWING A MEAL.) 30 tablet 3  . nicotine (NICODERM CQ - DOSED IN MG/24 HOURS) 21 mg/24hr patch Place 21 mg onto the skin daily.    . QUEtiapine (SEROQUEL) 100 MG tablet TAKE 1 TABLET BY MOUTH AT BEDTIME. 30 tablet 3  . imiquimod (ALDARA) 5 % cream Apply topically 3 (three) times a week. 12 each 0  . lidocaine (XYLOCAINE) 5 % ointment Apply topically 3 (three) times daily as needed (Mild Perianal/anogenital pain). 35.44 g 0  . mirtazapine (REMERON) 15 MG tablet TAKE 1 TABLET BY MOUTH AT BEDTIME. 30 tablet 3  . busPIRone (BUSPAR) 15 MG tablet Take 1 tablet (15 mg total) by mouth 3 (three) times daily. 90 tablet 2  . hydrOXYzine (ATARAX/VISTARIL) 25 MG tablet Take 1 tablet (25 mg total) by mouth every 8 (eight) hours as needed for anxiety. 60 tablet 1  . mirtazapine (REMERON) 15 MG tablet Take 1 tablet (15 mg total) by mouth at bedtime. 30 tablet 3  . imiquimod (ALDARA) 5 % cream APPLY TOPICALLY 3 (THREE) TIMES A WEEK. 12 each 0  . metoprolol succinate (TOPROL-XL) 100 MG 24 hr tablet Take 1  tablet (100 mg total) by mouth daily. Take with or immediately following a meal. 30 tablet 3  . QUEtiapine (SEROQUEL) 100 MG tablet Take 1 tablet (100 mg total) by mouth at bedtime. 30 tablet 3   No facility-administered medications prior to visit.     Past Medical History:  Diagnosis Date  . Anxiety   . Bipolar 1 disorder (HCC)   . HIV (human immunodeficiency virus infection) (HCC)      History reviewed. No pertinent surgical history.    Review of Systems  Constitutional: Negative for appetite change, chills, fatigue, fever and unexpected weight change.  Eyes: Negative for visual  disturbance.  Respiratory: Negative for cough, chest tightness, shortness of breath and wheezing.   Cardiovascular: Negative for chest pain and leg swelling.  Gastrointestinal: Negative for abdominal pain, constipation, diarrhea, nausea and vomiting.  Genitourinary: Negative for dysuria, flank pain, frequency, genital sores, hematuria and urgency.  Skin: Negative for rash.  Allergic/Immunologic: Negative for immunocompromised state.  Neurological: Negative for dizziness and headaches.      Objective:    Pulse (!) 59   Temp 98.3 F (36.8 C) (Oral)   Wt 202 lb (91.6 kg)   SpO2 100%   BMI 26.65 kg/m  Nursing note and vital signs reviewed.  Physical Exam Constitutional:      General: He is not in acute distress.    Appearance: He is well-developed.  Eyes:     Conjunctiva/sclera: Conjunctivae normal.  Cardiovascular:     Rate and Rhythm: Normal rate and regular rhythm.     Heart sounds: Normal heart sounds. No murmur heard. No friction rub. No gallop.   Pulmonary:     Effort: Pulmonary effort is normal. No respiratory distress.     Breath sounds: Normal breath sounds. No wheezing or rales.  Chest:     Chest wall: No tenderness.  Abdominal:     General: Bowel sounds are normal.     Palpations: Abdomen is soft.     Tenderness: There is no abdominal tenderness.  Musculoskeletal:     Cervical back: Neck supple.  Lymphadenopathy:     Cervical: No cervical adenopathy.  Skin:    General: Skin is warm and dry.     Findings: No rash.  Neurological:     Mental Status: He is alert and oriented to person, place, and time.  Psychiatric:        Behavior: Behavior normal.        Thought Content: Thought content normal.        Judgment: Judgment normal.      Depression screen [facility] 2/9 12/29/2020 11/28/2020 02/17/2018 05/07/2017 07/14/2015  Decreased Interest 0 0 0 3 0  Down, Depressed, Hopeless 0 1 0 3 0  PHQ - 2 Score 0 1 0 6 0  Altered sleeping - - - 1 -  Tired, decreased energy  - - - 1 -  Change in appetite - - - 0 -  Feeling bad or failure about yourself  - - - 1 -  Trouble concentrating - - - 1 -  Moving slowly or fidgety/restless - - - 0 -  Suicidal thoughts - - - 0 -  PHQ-9 Score - - - 10 -  Difficult doing work/chores - - - Somewhat difficult -       Assessment & Plan:    Patient Active Problem List   Diagnosis Date Noted  . Syphilis 11/28/2020  . Healthcare maintenance 11/28/2020  . Genital warts 10/21/2020  .  Anxiety disorder, unspecified 10/21/2020  . 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 Screening examination for venereal disease 07/14/2015  . Encounter for long-term (current) use of medications 07/14/2015  . Polysubstance dependence including opioid type drug, episodic abuse (HCC) 09/06/2014  . Major depression, recurrent (HCC) 09/06/2014  . ETOH abuse 09/04/2014  . Major depressive disorder, recurrent episode, moderate (HCC) 09/04/2014  . Generalized anxiety disorder 09/04/2014  . Alcohol abuse 02/16/2014  . Cocaine abuse (HCC) 02/16/2014  . 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 Benzodiazepine abuse (HCC) 02/16/2014  . Substance induced mood disorder (HCC) 02/15/2014  . Tobacco use disorder 08/03/2013  . Depression 08/03/2013  . HIV disease (HCC) 07/20/2013     Problem List Items Addressed This Visit      Other   HIV disease [facility])    Mr. Goody continues to have well-controlled HIV disease with good adherence and tolerance to his ART regimen of Biktarvy.  No signs/symptoms of opportunistic infection or progressive HIV.  We reviewed previous lab work and discussed plan of care.  Continue current dose of Biktarvy.  Plan for follow-up in 2 months or sooner if needed with lab work on the same day.      Polysubstance dependence including opioid type drug, episodic abuse (HCC)    Remained sober for 2-1/2 months and currently at [facility] with plans for longer-term  treatment options.  Congratulated on these efforts.  Continue current therapies.      Healthcare maintenance    Discussed importance of safe sexual practice to reduce risk of STI.  Condoms declined.          I have discontinued Jade Tofte's imiquimod and imiquimod. I am also having him maintain his nicotine, ibuprofen, hydrocortisone cream, busPIRone, mirtazapine, hydrOXYzine, busPIRone, hydrOXYzine, metoprolol succinate, QUEtiapine, bictegravir-emtricitabine-tenofovir AF, and lidocaine.   Meds ordered this encounter  Medications  . lidocaine (XYLOCAINE) 5 % ointment    Sig: Apply topically 3 (three) times daily as needed (Mild Perianal/anogenital pain).    Dispense:  35.44 g    Refill:  1    Order Specific Question:   Supervising Provider    Answer:   Judyann Munson [[id number]]     Follow-up: Return in about 2 months (around 02/28/2021).   Marcos Eke, MSN, FNP-C Nurse Practitioner [facility] Main number: [phone]

## 2020-12-30 ENCOUNTER — Encounter: Payer: Self-pay | Admitting: Family

## 2020-12-30 NOTE — Assessment & Plan Note (Signed)
Remained sober for 2-1/2 months and currently at [facility] with plans for longer-term treatment options.  Congratulated on these efforts.  Continue current therapies.

## 2020-12-30 NOTE — Assessment & Plan Note (Signed)
   Discussed importance of safe sexual practice to reduce risk of STI.  Condoms declined. 

## 2020-12-30 NOTE — Assessment & Plan Note (Signed)
Mr. Ralston continues to have well-controlled HIV disease with good adherence and tolerance to his ART regimen of Biktarvy.  No signs/symptoms of opportunistic infection or progressive HIV.  We reviewed previous lab work and discussed plan of care.  Continue current dose of Biktarvy.  Plan for follow-up in 2 months or sooner if needed with lab work on the same day.

## 2021-01-10 ENCOUNTER — Telehealth: Payer: Self-pay

## 2021-01-10 NOTE — Telephone Encounter (Signed)
Received call today from [facility] with [facility] requesting patient's VL. States patient is being transferred to a long term care facility and will need this before they can accept him.  Requested RN send signed release of information before labs can be faxed.  Will call once release has been received. Juanita Laster, RMA

## 2021-01-13 ENCOUNTER — Other Ambulatory Visit: Payer: Self-pay

## 2021-01-13 MED FILL — Metoprolol Succinate Tab ER 24HR 100 MG (Tartrate Equiv): ORAL | 30 days supply | Qty: 30 | Fill #0 | Status: AC

## 2021-01-16 ENCOUNTER — Other Ambulatory Visit: Payer: Self-pay

## 2021-02-13 ENCOUNTER — Other Ambulatory Visit: Payer: Self-pay

## 2021-02-13 DIAGNOSIS — B20 Human immunodeficiency virus [HIV] disease: Secondary | ICD-10-CM

## 2021-02-13 MED ORDER — LIDOCAINE 5 % EX OINT: TOPICAL_OINTMENT | Freq: Three times a day (TID) | CUTANEOUS | 1 refills | Status: DC | PRN

## 2021-02-13 MED ORDER — BICTEGRAVIR-EMTRICITAB-TENOFOV 50-200-25 MG PO TABS: 1.0000 | ORAL_TABLET | Freq: Every day | ORAL | 0 refills | Status: DC

## 2021-02-15 ENCOUNTER — Other Ambulatory Visit: Payer: Self-pay | Admitting: Physician Assistant

## 2021-02-15 MED ORDER — BUSPIRONE HCL 15 MG PO TABS: ORAL_TABLET | Freq: Three times a day (TID) | ORAL | 0 refills | Status: DC

## 2021-02-15 MED ORDER — HYDROXYZINE HCL 25 MG PO TABS: ORAL_TABLET | Freq: Three times a day (TID) | ORAL | 0 refills | Status: DC | PRN

## 2021-02-15 NOTE — Telephone Encounter (Signed)
Medication Refill - Medication:   busPIRone (BUSPAR) 15 MG tablet   hydrOXYzine (ATARAX/VISTARIL) 25 MG tablet     Has the patient contacted their pharmacy? Yes.   No refills left.    Preferred Pharmacy (with phone number or street name):   [facility] [phone] [address] [place], [place] [address]  Phone: [phone]   Agent: Please be advised that RX refills may take up to 3 business days. We ask that you follow-up with your pharmacy.

## 2021-02-20 ENCOUNTER — Telehealth: Payer: Self-pay

## 2021-02-20 NOTE — Telephone Encounter (Signed)
Received notification from pharmacy that Lidocaine ointment is not covered by [employer]. Routing to provider for advise. Henry [name]

## 2021-02-23 ENCOUNTER — Encounter: Payer: Self-pay | Admitting: Family

## 2021-02-23 ENCOUNTER — Other Ambulatory Visit: Payer: Self-pay

## 2021-02-23 ENCOUNTER — Ambulatory Visit (INDEPENDENT_AMBULATORY_CARE_PROVIDER_SITE_OTHER): Payer: Self-pay | Admitting: Family

## 2021-02-23 VITALS — BP 152/102 | HR 101 | Temp 98.3°F | Wt 214.0 lb

## 2021-02-23 DIAGNOSIS — B20 Human immunodeficiency virus [HIV] disease: Secondary | ICD-10-CM

## 2021-02-23 DIAGNOSIS — F3181 Bipolar II disorder: Secondary | ICD-10-CM

## 2021-02-23 DIAGNOSIS — A539 Syphilis, unspecified: Secondary | ICD-10-CM

## 2021-02-23 MED ORDER — BICTEGRAVIR-EMTRICITAB-TENOFOV 50-200-25 MG PO TABS: 1.0000 | ORAL_TABLET | Freq: Every day | ORAL | 3 refills | Status: DC

## 2021-02-23 NOTE — Progress Notes (Signed)
Brief Narrative   Patient ID: Henry [name], male    DOB: [birth year], 40 y.o.   MRN: [id number]    Subjective:    Chief Complaint  Patient presents with   Follow-up    2 month follow up , labs , need a refill on b/p medicine      HPI:  Henry [name] is a 40 y.o. male with HIV disease last seen on 11/28/2020 with well-controlled virus and good adherence and tolerance to his ART regimen of Biktarvy.  Viral load was undetectable with CD4 count of 507.  RPR was positive at 1: 64 and successfully received 3 doses of 2,400,000 units of Bicillin.  Here today for routine follow-up.  Henry [name] continues to take his Biktarvy daily as prescribed with no adverse side effects or missed doses since his last office visit.  Overall feeling well today.  Has concerns about his other medications and how to receive them.  Has confusion as to who is prescribing what medications. Denies fevers, chills, night sweats, headaches, changes in vision, neck pain/stiffness, nausea, diarrhea, vomiting, lesions or rashes.  Henry [name] is able to obtain his medication from the pharmacy.  Denies feelings of being down, depressed, or hopeless recently.  No current alcohol consumption, recreational or illicit drug use, or tobacco use.  Condoms declined.  No Known Allergies    Outpatient Medications Prior to Visit  Medication Sig Dispense Refill   busPIRone (BUSPAR) 15 MG tablet Take 1 tablet (15 mg total) by mouth 3 (three) times daily. 90 tablet 2   busPIRone (BUSPAR) 15 MG tablet TAKE 1 TABLET BY MOUTH 3 TIMES DAILY 90 tablet 0   hydrocortisone cream 1 % Apply topically 2 (two) times daily. 30 g 0   hydrOXYzine (ATARAX/VISTARIL) 25 MG tablet Take 1 tablet (25 mg total) by mouth every 8 (eight) hours as needed for anxiety. 60 tablet 1   hydrOXYzine (ATARAX/VISTARIL) 25 MG tablet TAKE 1 TABLET BY MOUTH EVERY 8 HOURS AS NEEDED FOR ANXIETY. 60 tablet 0   ibuprofen (ADVIL) 600 MG tablet Take 1 tablet (600 mg total) by mouth  every 6 (six) hours as needed (Mild pain/Inflammation of warts in perianal/anogenital area). 30 tablet 0   lidocaine (XYLOCAINE) 5 % ointment Apply topically 3 (three) times daily as needed (Mild Perianal/anogenital pain). 35.44 g 1   metoprolol succinate (TOPROL-XL) 100 MG 24 hr tablet TAKE 1 TABLET BY MOUTH DAILY. TAKE WITH OR IMMEDIATELY FOLLOWING A MEAL. 30 tablet 3   mirtazapine (REMERON) 15 MG tablet Take 1 tablet (15 mg total) by mouth at bedtime. 30 tablet 3   nicotine (NICODERM CQ - DOSED IN MG/24 HOURS) 21 mg/24hr patch Place 21 mg onto the skin daily.     QUEtiapine (SEROQUEL) 100 MG tablet TAKE 1 TABLET BY MOUTH AT BEDTIME. 30 tablet 3   bictegravir-emtricitabine-tenofovir AF (BIKTARVY) 50-200-25 MG TABS tablet Take 1 tablet by mouth daily. 30 tablet 0   No facility-administered medications prior to visit.     Past Medical History:  Diagnosis Date   Anxiety    Bipolar 1 disorder (HCC)    HIV (human immunodeficiency virus infection) (HCC)      History reviewed. No pertinent surgical history.     Review of Systems  Constitutional:  Negative for appetite change, chills, fatigue, fever and unexpected weight change.  Eyes:  Negative for visual disturbance.  Respiratory:  Negative for cough, chest tightness, shortness of breath and wheezing.   Cardiovascular:  Negative for chest  pain and leg swelling.  Gastrointestinal:  Negative for abdominal pain, constipation, diarrhea, nausea and vomiting.  Genitourinary:  Negative for dysuria, flank pain, frequency, genital sores, hematuria and urgency.  Skin:  Negative for rash.  Allergic/Immunologic: Negative for immunocompromised state.  Neurological:  Negative for dizziness and headaches.     Objective:    BP (!) 152/102   Pulse (!) 101   Temp 98.3 F (36.8 C)   Wt 214 lb (97.1 kg)   SpO2 98%   BMI 28.23 kg/m  Nursing note and vital signs reviewed.  Physical Exam Constitutional:      General: He is not in acute  distress.    Appearance: He is well-developed.  Eyes:     Conjunctiva/sclera: Conjunctivae normal.  Cardiovascular:     Rate and Rhythm: Normal rate and regular rhythm.     Heart sounds: Normal heart sounds. No murmur heard.   No friction rub. No gallop.  Pulmonary:     Effort: Pulmonary effort is normal. No respiratory distress.     Breath sounds: Normal breath sounds. No wheezing or rales.  Chest:     Chest wall: No tenderness.  Abdominal:     General: Bowel sounds are normal.     Palpations: Abdomen is soft.     Tenderness: There is no abdominal tenderness.  Musculoskeletal:     Cervical back: Neck supple.  Lymphadenopathy:     Cervical: No cervical adenopathy.  Skin:    General: Skin is warm and dry.     Findings: No rash.  Neurological:     Mental Status: He is alert and oriented to person, place, and time.  Psychiatric:        Behavior: Behavior normal.        Thought Content: Thought content normal.        Judgment: Judgment normal.     Depression screen [facility] 2/9 02/23/2021 12/29/2020 11/28/2020 02/17/2018 05/07/2017  Decreased Interest 0 0 0 0 3  Down, Depressed, Hopeless 0 0 1 0 3  PHQ - 2 Score 0 0 1 0 6  Altered sleeping - - - - 1  Tired, decreased energy - - - - 1  Change in appetite - - - - 0  Feeling bad or failure about yourself  - - - - 1  Trouble concentrating - - - - 1  Moving slowly or fidgety/restless - - - - 0  Suicidal thoughts - - - - 0  PHQ-9 Score - - - - 10  Difficult doing work/chores - - - - Somewhat difficult       Assessment & Plan:    Patient Active Problem List   Diagnosis Date Noted   Syphilis 11/28/2020   Healthcare maintenance 11/28/2020   Genital warts 10/21/2020   Anxiety disorder, unspecified 10/21/2020   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) 10/18/2020   Screening examination for venereal disease 07/14/2015   Encounter for long-term (current) use of medications 07/14/2015   Polysubstance  dependence including opioid type drug, episodic abuse (HCC) 09/06/2014   Major depression, recurrent (HCC) 09/06/2014   ETOH abuse 09/04/2014   Major depressive disorder, recurrent episode, moderate (HCC) 09/04/2014   Generalized anxiety disorder 09/04/2014   Alcohol abuse 02/16/2014   Cocaine abuse (HCC) 02/16/2014   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 Shamoon continues to have well-controlled HIV disease with good adherence and tolerance to his ART regimen of Biktarvy.  No signs/symptoms of opportunistic infection or progressive HIV.  Reviewed previous lab work and discussed plan of care.  Check blood work today.  Continue current dose of Biktarvy.  Plan for follow-up in 3 months or sooner if needed with lab work on the same day.       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 Brining appears to be adequately controlled with current medication regimen and has confusion as to who is prescribing his psychiatric medications.  Recommend follow-up with primary care as it appears there is a last to fill his mirtazapine, Seroquel, and buspirone.  Appears to be mentally stable and working to maintain his current health.       Syphilis    Mr. Basey was treated with 3 weekly injections of 2,400,000 units of Bicillin.  Recheck RPR today.  No current symptoms.  Discussed importance of safe sexual practice to reduce risk of STI.       Relevant Medications    bictegravir-emtricitabine-tenofovir AF (BIKTARVY) 50-200-25 MG TABS tablet   Other Relevant Orders   RPR     I am having Justus Memory maintain his nicotine, ibuprofen, hydrocortisone cream, busPIRone, mirtazapine, hydrOXYzine, metoprolol succinate, QUEtiapine, lidocaine, busPIRone, hydrOXYzine, and bictegravir-emtricitabine-tenofovir AF.   Meds ordered this encounter  Medications   bictegravir-emtricitabine-tenofovir AF (BIKTARVY) 50-200-25 MG TABS tablet    Sig: Take 1 tablet by mouth daily.    Dispense:  30 tablet    Refill:  3    Order Specific Question:   Supervising Provider    Answer:   Judyann Munson [[id number]]     Follow-up: Return in about 3 months (around 05/26/2021).   Marcos Eke, MSN, FNP-C Nurse Practitioner [facility] for [facility]ious Disease [facility] Main number: [phone]

## 2021-02-23 NOTE — Patient Instructions (Addendum)
Nice to see you.  We will check your lab work today.  Continue to take your medication daily.  Refills have been sent to the pharmacy.  Follow up with [facility].  Plan for follow up in 3 months or sooner if needed.  Have a great day and stay safe!

## 2021-02-23 NOTE — Assessment & Plan Note (Signed)
Henry [name] appears to be adequately controlled with current medication regimen and has confusion as to who is prescribing his psychiatric medications.  Recommend follow-up with primary care as it appears there is a last to fill his mirtazapine, Seroquel, and buspirone.  Appears to be mentally stable and working to maintain his current health.

## 2021-02-23 NOTE — Assessment & Plan Note (Signed)
Henry [name] continues to have well-controlled HIV disease with good adherence and tolerance to his ART regimen of Biktarvy.  No signs/symptoms of opportunistic infection or progressive HIV.  Reviewed previous lab work and discussed plan of care.  Check blood work today.  Continue current dose of Biktarvy.  Plan for follow-up in 3 months or sooner if needed with lab work on the same day.

## 2021-02-23 NOTE — Assessment & Plan Note (Signed)
Mr. Hellinger was treated with 3 weekly injections of 2,400,000 units of Bicillin.  Recheck RPR today.  No current symptoms.  Discussed importance of safe sexual practice to reduce risk of STI.

## 2021-02-24 LAB — T-HELPER CELL (CD4) - (RCID CLINIC ONLY)
CD4 % Helper T Cell: 27 % — ABNORMAL LOW (ref 33–65)
CD4 T Cell Abs: 547 /uL (ref 400–1790)

## 2021-02-27 ENCOUNTER — Telehealth: Payer: Self-pay

## 2021-02-27 LAB — HIV-1 RNA QUANT-NO REFLEX-BLD
HIV 1 RNA Quant: NOT DETECTED Copies/mL
HIV-1 RNA Quant, Log: NOT DETECTED Log cps/mL

## 2021-02-27 LAB — FLUORESCENT TREPONEMAL AB(FTA)-IGG-BLD: Fluorescent Treponemal ABS: REACTIVE — AB

## 2021-02-27 LAB — RPR TITER: RPR Titer: 1:8 {titer} — ABNORMAL HIGH

## 2021-02-27 LAB — RPR: RPR Ser Ql: REACTIVE — AB

## 2021-02-27 NOTE — Telephone Encounter (Signed)
Attempted to contact patient to relay test results. Unfortunately the number listed on as the patient's cell is for a facility. When RN called to give results, they weren't able to confirm/deny if the patient was in residence. Will forward to provider to let him know we weren't able to deliver news.   Ashlye Oviedo Loyola Mast, RN

## 2021-02-27 NOTE — Telephone Encounter (Signed)
-----   Message from Veryl Speak, FNP sent at 02/27/2021  3:10 PM EDT ----- Please inform Mr. Karren that his viral load is undetectable and CD4 count is 547. His RPR titer for syphilis is down to 1:8 indicating successful treatment.

## 2021-03-21 ENCOUNTER — Other Ambulatory Visit: Payer: Self-pay | Admitting: Physician Assistant

## 2021-03-21 ENCOUNTER — Other Ambulatory Visit: Payer: Self-pay | Admitting: Family

## 2021-03-21 DIAGNOSIS — B20 Human immunodeficiency virus [HIV] disease: Secondary | ICD-10-CM

## 2021-03-21 NOTE — Telephone Encounter (Signed)
  Notes to clinic: Patient due for follow up around 02/17/2021 Review for refill    Requested Prescriptions  Pending Prescriptions Disp Refills   busPIRone (BUSPAR) 15 MG tablet [Pharmacy Med Name: BUSPIRONE 15MG  TABLETS] 90 tablet 0    Sig: TAKE 1 TABLET BY MOUTH THREE TIMES DAILY      Psychiatry: Anxiolytics/Hypnotics - Non-controlled Passed - 03/21/2021  8:06 AM      Passed - Valid encounter within last 6 months    Recent Outpatient Visits           4 months ago Anxiety disorder, unspecified type   [facility] [place], [place], Marzella Schlein       Future Appointments             In 1 month [place], [place], FNP [facility]ious Disease, [facility]

## 2021-05-19 ENCOUNTER — Ambulatory Visit: Payer: Self-pay | Admitting: Family

## 2021-06-05 ENCOUNTER — Ambulatory Visit: Payer: Self-pay | Admitting: Family

## 2021-06-06 ENCOUNTER — Ambulatory Visit: Payer: Self-pay | Admitting: Family

## 2021-06-09 ENCOUNTER — Ambulatory Visit (INDEPENDENT_AMBULATORY_CARE_PROVIDER_SITE_OTHER): Payer: Self-pay | Admitting: Family

## 2021-06-09 ENCOUNTER — Encounter: Payer: Self-pay | Admitting: Family

## 2021-06-09 ENCOUNTER — Ambulatory Visit: Payer: Self-pay

## 2021-06-09 ENCOUNTER — Other Ambulatory Visit: Payer: Self-pay

## 2021-06-09 VITALS — BP 116/75 | HR 73 | Temp 97.6°F | Wt 223.0 lb

## 2021-06-09 DIAGNOSIS — A539 Syphilis, unspecified: Secondary | ICD-10-CM

## 2021-06-09 DIAGNOSIS — Z23 Encounter for immunization: Secondary | ICD-10-CM

## 2021-06-09 DIAGNOSIS — B20 Human immunodeficiency virus [HIV] disease: Secondary | ICD-10-CM

## 2021-06-09 DIAGNOSIS — Z0000 Encounter for general adult medical examination without abnormal findings: Secondary | ICD-10-CM

## 2021-06-09 MED ORDER — BICTEGRAVIR-EMTRICITAB-TENOFOV 50-200-25 MG PO TABS: 1.0000 | ORAL_TABLET | Freq: Every day | ORAL | 5 refills | Status: DC

## 2021-06-09 NOTE — Patient Instructions (Signed)
Nice to see you.  Happy Birthday!  Continue to take your medication daily as prescribed.  Refills have been sent to the pharmacy and pending ADAP approval.  We will check your lab work today.  Plan for follow up in 4 months or sooner if needed.   Have a great day and stay safe!

## 2021-06-09 NOTE — Assessment & Plan Note (Signed)
Henry [name] previous RPR titer of 1: 64 down to 1: 8 indicating successful treatment of syphilis.  Check RPR today.

## 2021-06-09 NOTE — Assessment & Plan Note (Signed)
   Discussed importance of safe sexual practice and condom usage.  Condoms offered.  Influenza and Menveo updated.

## 2021-06-09 NOTE — Progress Notes (Signed)
Brief Narrative   Patient ID: Henry [name], male    DOB: [birth year], 40 y.o.   MRN: [id number]  Henry [name] is a 40 y/o AA male with HIV-1 disease diagnosed with HIV in 2006/2007 with risk factor including MSM. Initial viral load on 07/16/2013 was 63,493 with CD4 nadir or 260. Genotype with K103N resistance (Efavirenz and Nivirapine). No history of opportunistic infections. Previous ART regimens include Stribild, Jorje Guild, and Biktarvy  Subjective:    Chief Complaint  Patient presents with   Follow-up    B20    HPI:  Henry [name] is a 40 y.o. male with HIV disease last seen on 02/23/21 with well controlled virus and good adherence and tolerance to his ART regimen of Biktarvy. Viral load was undetectable and CD4 count of 547. RPR titer improved from 1:64 to 1:8. Here today for routine follow up.   Henry [name] continues to take his Biktarvy daily as prescribed with no adverse side effects.  Overall feeling well today with no new concerns or complaints. Denies fevers, chills, night sweats, headaches, changes in vision, neck pain/stiffness, nausea, diarrhea, vomiting, lesions or rashes.  Henry [name] has no problems obtaining medication from the pharmacy and renewed financial assistance.  Samples provided to provide bridge while awaiting renewal.  Denies feelings of being down, depressed, or hopeless recently.  No current recreational or illicit drug use, with 1 pack of cigarettes per day on average and no alcohol consumption.  Condoms offered.  Healthcare maintenance due includes influenza and Menveo.  No Known Allergies    Outpatient Medications Prior to Visit  Medication Sig Dispense Refill   busPIRone (BUSPAR) 15 MG tablet Take 1 tablet (15 mg total) by mouth 3 (three) times daily. 90 tablet 2   hydrocortisone cream 1 % Apply topically 2 (two) times daily. 30 g 0   hydrOXYzine (ATARAX/VISTARIL) 25 MG tablet Take 1 tablet (25 mg total) by mouth every 8 (eight) hours as needed for anxiety. 60  tablet 1   ibuprofen (ADVIL) 600 MG tablet Take 1 tablet (600 mg total) by mouth every 6 (six) hours as needed (Mild pain/Inflammation of warts in perianal/anogenital area). 30 tablet 0   lidocaine (XYLOCAINE) 5 % ointment Apply topically 3 (three) times daily as needed (Mild Perianal/anogenital pain). 35.44 g 1   metoprolol succinate (TOPROL-XL) 100 MG 24 hr tablet TAKE 1 TABLET BY MOUTH DAILY. TAKE WITH OR IMMEDIATELY FOLLOWING A MEAL. 30 tablet 3   mirtazapine (REMERON) 15 MG tablet Take 1 tablet (15 mg total) by mouth at bedtime. 30 tablet 3   nicotine (NICODERM CQ - DOSED IN MG/24 HOURS) 21 mg/24hr patch Place 21 mg onto the skin daily.     QUEtiapine (SEROQUEL) 100 MG tablet TAKE 1 TABLET BY MOUTH AT BEDTIME. 30 tablet 3   bictegravir-emtricitabine-tenofovir AF (BIKTARVY) 50-200-25 MG TABS tablet Take 1 tablet by mouth daily. 30 tablet 3   busPIRone (BUSPAR) 15 MG tablet TAKE 1 TABLET BY MOUTH 3 TIMES DAILY (Patient not taking: Reported on 06/09/2021) 90 tablet 0   hydrOXYzine (ATARAX/VISTARIL) 25 MG tablet TAKE 1 TABLET BY MOUTH EVERY 8 HOURS AS NEEDED FOR ANXIETY. (Patient not taking: Reported on 06/09/2021) 60 tablet 0   No facility-administered medications prior to visit.     Past Medical History:  Diagnosis Date   Anxiety    Bipolar 1 disorder ([place])    HIV (human immunodeficiency virus infection) ([place])      History reviewed. No pertinent surgical history.  Review of Systems  Constitutional:  Negative for appetite change, chills, fatigue, fever and unexpected weight change.  Eyes:  Negative for visual disturbance.  Respiratory:  Negative for cough, chest tightness, shortness of breath and wheezing.   Cardiovascular:  Negative for chest pain and leg swelling.  Gastrointestinal:  Negative for abdominal pain, constipation, diarrhea, nausea and vomiting.  Genitourinary:  Negative for dysuria, flank pain, frequency, genital sores, hematuria and urgency.  Skin:  Negative for  rash.  Allergic/Immunologic: Negative for immunocompromised state.  Neurological:  Negative for dizziness and headaches.     Objective:    BP 116/75   Pulse 73   Temp 97.6 F (36.4 C) (Oral)   Wt 223 lb (101.2 kg)   BMI 29.42 kg/m  Nursing note and vital signs reviewed.  Physical Exam Constitutional:      General: He is not in acute distress.    Appearance: He is well-developed.  Eyes:     Conjunctiva/sclera: Conjunctivae normal.  Cardiovascular:     Rate and Rhythm: Normal rate and regular rhythm.     Heart sounds: Normal heart sounds. No murmur heard.   No friction rub. No gallop.  Pulmonary:     Effort: Pulmonary effort is normal. No respiratory distress.     Breath sounds: Normal breath sounds. No wheezing or rales.  Chest:     Chest wall: No tenderness.  Abdominal:     General: Bowel sounds are normal.     Palpations: Abdomen is soft.     Tenderness: There is no abdominal tenderness.  Musculoskeletal:     Cervical back: Neck supple.  Lymphadenopathy:     Cervical: No cervical adenopathy.  Skin:    General: Skin is warm and dry.     Findings: No rash.  Neurological:     Mental Status: He is alert and oriented to person, place, and time.  Psychiatric:        Behavior: Behavior normal.        Thought Content: Thought content normal.        Judgment: Judgment normal.     Depression screen [facility] 2/9 06/09/2021 02/23/2021 12/29/2020 11/28/2020 02/17/2018  Decreased Interest 0 0 0 0 0  Down, Depressed, Hopeless 1 0 0 1 0  PHQ - 2 Score 1 0 0 1 0  Altered sleeping 0 - - - -  Tired, decreased energy 0 - - - -  Change in appetite 0 - - - -  Feeling bad or failure about yourself  0 - - - -  Trouble concentrating 0 - - - -  Moving slowly or fidgety/restless 0 - - - -  Suicidal thoughts 0 - - - -  PHQ-9 Score 1 - - - -  Difficult doing work/chores Somewhat difficult - - - -       Assessment & Plan:    Patient Active Problem List   Diagnosis Date Noted   Syphilis  11/28/2020   Healthcare maintenance 11/28/2020   Genital warts 10/21/2020   Anxiety disorder, unspecified 10/21/2020   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 ([place]) 10/18/2020   Screening examination for venereal disease 07/14/2015   Encounter for long-term (current) use of medications 07/14/2015   Polysubstance dependence including opioid type drug, episodic abuse ([place]) 09/06/2014   Major depression, recurrent ([place]) 09/06/2014   ETOH abuse 09/04/2014   Major depressive disorder, recurrent episode, moderate (HCC) 09/04/2014   Generalized anxiety disorder 09/04/2014   Alcohol abuse 02/16/2014   Cocaine  abuse ([place]) 02/16/2014   Opioid abuse with opioid-induced mood disorder ([place]) 02/16/2014   Benzodiazepine abuse ([place]) 02/16/2014   Substance induced mood disorder ([place]) 02/15/2014   Tobacco use disorder 08/03/2013   Depression 08/03/2013   HIV disease ([place]) 07/20/2013     Problem List Items Addressed This Visit       Other   HIV disease ([place]) - Primary    Mr. Youtz continues to have well-controlled virus with good adherence and tolerance to his ART regimen of Biktarvy.  No signs/symptoms of opportunistic infection.  We reviewed previous lab work and discussed plan of care.  Samples provided and recorded in pharmacy log.  Check blood work today.  Continue current dose of Biktarvy.  Plan for follow-up in 4 months or sooner if needed with lab work on the same day.      Relevant Medications   bictegravir-emtricitabine-tenofovir AF (BIKTARVY) 50-200-25 MG TABS tablet   Other Relevant Orders   HIV-1 RNA quant-no reflex-bld   Comp Met (CMET)   T-helper cells (CD4) count   Syphilis    Mr. Gassmann previous RPR titer of 1: 64 down to 1: 8 indicating successful treatment of syphilis.  Check RPR today.      Relevant Medications   bictegravir-emtricitabine-tenofovir AF (BIKTARVY) 50-200-25 MG TABS tablet   Other Relevant Orders   RPR   Healthcare maintenance     Discussed importance of safe sexual practice and condom usage.  Condoms offered. Influenza and Menveo updated.      Other Visit Diagnoses     Need for immunization against influenza       Relevant Orders   Flu Vaccine QUAD 67moIM (Fluarix, Fluzone & Alfiuria Quad PF) (Completed)   Need for meningococcal vaccination       Relevant Orders   MENINGOCOCCAL MCV4O (Completed)        I am having THilma Favorsmaintain his nicotine, ibuprofen, hydrocortisone cream, busPIRone, mirtazapine, hydrOXYzine, metoprolol succinate, QUEtiapine, lidocaine, and bictegravir-emtricitabine-tenofovir AF.   Meds ordered this encounter  Medications   bictegravir-emtricitabine-tenofovir AF (BIKTARVY) 50-200-25 MG TABS tablet    Sig: Take 1 tablet by mouth daily.    Dispense:  30 tablet    Refill:  5    Please fill once ADAP approved.    Order Specific Question:   Supervising Provider    Answer:   SCarlyle Basques[[id number]]     Follow-up: Return in about 4 months (around 10/10/2021), or if symptoms worsen or fail to improve.   GTerri Piedra MSN, FNP-C Nurse Practitioner [facility] CGogebicnumber: [phone]

## 2021-06-09 NOTE — Assessment & Plan Note (Signed)
Henry [name] continues to have well-controlled virus with good adherence and tolerance to his ART regimen of Biktarvy.  No signs/symptoms of opportunistic infection.  We reviewed previous lab work and discussed plan of care.  Samples provided and recorded in pharmacy log.  Check blood work today.  Continue current dose of Biktarvy.  Plan for follow-up in 4 months or sooner if needed with lab work on the same day.

## 2021-06-13 ENCOUNTER — Other Ambulatory Visit: Payer: Self-pay | Admitting: Pharmacist

## 2021-06-13 DIAGNOSIS — B20 Human immunodeficiency virus [HIV] disease: Secondary | ICD-10-CM

## 2021-06-13 LAB — COMPREHENSIVE METABOLIC PANEL
AG Ratio: 1.6 (calc) (ref 1.0–2.5)
ALT: 30 U/L (ref 9–46)
AST: 25 U/L (ref 10–40)
Albumin: 4.6 g/dL (ref 3.6–5.1)
Alkaline phosphatase (APISO): 42 U/L (ref 36–130)
BUN: 16 mg/dL (ref 7–25)
CO2: 27 mmol/L (ref 20–32)
Calcium: 9.1 mg/dL (ref 8.6–10.3)
Chloride: 107 mmol/L (ref 98–110)
Creat: 1.16 mg/dL (ref 0.60–1.29)
Globulin: 2.8 g/dL (calc) (ref 1.9–3.7)
Glucose, Bld: 87 mg/dL (ref 65–99)
Potassium: 4.2 mmol/L (ref 3.5–5.3)
Sodium: 138 mmol/L (ref 135–146)
Total Bilirubin: 0.4 mg/dL (ref 0.2–1.2)
Total Protein: 7.4 g/dL (ref 6.1–8.1)

## 2021-06-13 LAB — FLUORESCENT TREPONEMAL AB(FTA)-IGG-BLD: Fluorescent Treponemal ABS: REACTIVE — AB

## 2021-06-13 LAB — RPR: RPR Ser Ql: REACTIVE — AB

## 2021-06-13 LAB — T-HELPER CELLS (CD4) COUNT (NOT AT ARMC)
Absolute CD4: 591 cells/uL (ref 490–1740)
CD4 T Helper %: 28 % — ABNORMAL LOW (ref 30–61)
Total lymphocyte count: 2084 cells/uL (ref 850–3900)

## 2021-06-13 LAB — RPR TITER: RPR Titer: 1:4 {titer} — ABNORMAL HIGH

## 2021-06-13 LAB — HIV-1 RNA QUANT-NO REFLEX-BLD
HIV 1 RNA Quant: NOT DETECTED Copies/mL
HIV-1 RNA Quant, Log: NOT DETECTED Log cps/mL

## 2021-06-13 MED ORDER — BICTEGRAVIR-EMTRICITAB-TENOFOV 50-200-25 MG PO TABS: 1.0000 | ORAL_TABLET | Freq: Every day | ORAL | 0 refills | Status: AC

## 2021-06-13 NOTE — Progress Notes (Signed)
Medication Samples have been provided to the patient.  Drug name: Biktarvy        Strength: 50/200/25 mg       Qty: 14 tablets (2 bottles) LOT: [id number]   Exp.Date: 02/02/23  Dosing instructions: Take one tablet by mouth once daily  The patient has been instructed regarding the correct time, dose, and frequency of taking this medication, including desired effects and most common side effects.   Margarite Gouge, PharmD, CPP Clinical Pharmacist Practitioner Infectious Diseases Clinical Pharmacist [facility]

## 2021-06-15 ENCOUNTER — Telehealth: Payer: Self-pay

## 2021-06-15 NOTE — Telephone Encounter (Signed)
Patient made aware of lab results; no further questions or concerns at this time.   Messi Twedt Loyola Mast, RN

## 2021-06-15 NOTE — Telephone Encounter (Signed)
-----   Message from Veryl Speak, FNP sent at 06/15/2021  8:51 AM EDT ----- Please inform Henry [name] that his lab work looks good with viral load undetectable and CD4 count of 591. Kidney function, liver function, and electrolytes are good as well.

## 2021-10-25 ENCOUNTER — Encounter: Payer: Self-pay | Admitting: Family

## 2021-10-25 ENCOUNTER — Other Ambulatory Visit: Payer: Self-pay

## 2021-10-25 ENCOUNTER — Ambulatory Visit (INDEPENDENT_AMBULATORY_CARE_PROVIDER_SITE_OTHER): Payer: Self-pay | Admitting: Family

## 2021-10-25 VITALS — BP 143/82 | HR 79 | Temp 98.5°F | Ht 73.0 in | Wt 237.0 lb

## 2021-10-25 DIAGNOSIS — Z79899 Other long term (current) drug therapy: Secondary | ICD-10-CM

## 2021-10-25 DIAGNOSIS — A539 Syphilis, unspecified: Secondary | ICD-10-CM

## 2021-10-25 DIAGNOSIS — Z0000 Encounter for general adult medical examination without abnormal findings: Secondary | ICD-10-CM

## 2021-10-25 DIAGNOSIS — B20 Human immunodeficiency virus [HIV] disease: Secondary | ICD-10-CM

## 2021-10-25 MED ORDER — BICTEGRAVIR-EMTRICITAB-TENOFOV 50-200-25 MG PO TABS: 1.0000 | ORAL_TABLET | Freq: Every day | ORAL | 5 refills | Status: DC

## 2021-10-25 NOTE — Progress Notes (Signed)
Brief Narrative   Patient ID: Henry [name], male    DOB: 1980-10-24, 41 y.o.   MRN: [id number]  Henry [name] is a 42 y/o AA male with HIV-1 disease diagnosed with HIV in 2006/2007 with risk factor including MSM. Initial viral load on 07/16/2013 was 63,493 with CD4 nadir or 260. Genotype with K103N resistance (Efavirenz and Nivirapine). No history of opportunistic infections. Previous ART regimens include Stribild, Jorje Guild, and Biktarvy    Subjective:    Chief Complaint  Patient presents with   Follow-up    HPI:  Henry [name] is a 41 y.o. male with HIV disease last seen on 06/09/2021 with well-controlled virus and good adherence and tolerance to his ART regimen of Biktarvy.  Viral load was undetectable with CD4 count of 591.  Here today for routine follow-up.  Henry [name] continues to take his Biktarvy daily as prescribed with no adverse side effects.  Overall feeling well today with no new concerns/complaints.  He is celebrating his 1 year anniversary of being sober. Denies fevers, chills, night sweats, headaches, changes in vision, neck pain/stiffness, nausea, diarrhea, vomiting, lesions or rashes.  Henry [name] has no problems obtaining medication from the pharmacy.  Denies feelings of being down, depressed, or hopeless recently.  No current recreational or illicit drug use or alcohol consumption.  Continues to smoke tobacco.  Condoms offered.  Healthcare maintenance due includes Pneumovax and COVID vaccinations.   No Known Allergies    Outpatient Medications Prior to Visit  Medication Sig Dispense Refill   busPIRone (BUSPAR) 10 MG tablet Take 20 mg by mouth 3 (three) times daily.     ibuprofen (ADVIL) 600 MG tablet Take 1 tablet (600 mg total) by mouth every 6 (six) hours as needed (Mild pain/Inflammation of warts in perianal/anogenital area). 30 tablet 0   lidocaine (XYLOCAINE) 5 % ointment Apply topically 3 (three) times daily as needed (Mild Perianal/anogenital pain). 35.44 g 1    metoprolol succinate (TOPROL-XL) 100 MG 24 hr tablet TAKE 1 TABLET BY MOUTH DAILY. TAKE WITH OR IMMEDIATELY FOLLOWING A MEAL. 30 tablet 3   mirtazapine (REMERON) 15 MG tablet Take 1 tablet (15 mg total) by mouth at bedtime. 30 tablet 3   QUEtiapine (SEROQUEL) 100 MG tablet TAKE 1 TABLET BY MOUTH AT BEDTIME. 30 tablet 3   bictegravir-emtricitabine-tenofovir AF (BIKTARVY) 50-200-25 MG TABS tablet Take 1 tablet by mouth daily. 30 tablet 5   busPIRone (BUSPAR) 15 MG tablet Take 1 tablet (15 mg total) by mouth 3 (three) times daily. (Patient not taking: Reported on 10/25/2021) 90 tablet 2   hydrocortisone cream 1 % Apply topically 2 (two) times daily. (Patient not taking: Reported on 10/25/2021) 30 g 0   hydrOXYzine (ATARAX/VISTARIL) 25 MG tablet Take 1 tablet (25 mg total) by mouth every 8 (eight) hours as needed for anxiety. (Patient not taking: Reported on 10/25/2021) 60 tablet 1   hydrOXYzine (VISTARIL) 25 MG capsule Take 25 mg by mouth 3 (three) times daily. (Patient not taking: Reported on 10/25/2021)     nicotine (NICODERM CQ - DOSED IN MG/24 HOURS) 21 mg/24hr patch Place 21 mg onto the skin daily. (Patient not taking: Reported on 10/25/2021)     No facility-administered medications prior to visit.     Past Medical History:  Diagnosis Date   Anxiety    Bipolar 1 disorder ([place])    HIV (human immunodeficiency virus infection) ([place])      History reviewed. No pertinent surgical history.    Review of Systems  Constitutional:  Negative for appetite change, chills, fatigue, fever and unexpected weight change.  Eyes:  Negative for visual disturbance.  Respiratory:  Negative for cough, chest tightness, shortness of breath and wheezing.   Cardiovascular:  Negative for chest pain and leg swelling.  Gastrointestinal:  Negative for abdominal pain, constipation, diarrhea, nausea and vomiting.  Genitourinary:  Negative for dysuria, flank pain, frequency, genital sores, hematuria and urgency.  Skin:   Negative for rash.  Allergic/Immunologic: Negative for immunocompromised state.  Neurological:  Negative for dizziness and headaches.     Objective:    BP (!) 143/82    Pulse 79    Temp 98.5 F (36.9 C) (Oral)    Ht 6\' 1"  (1.854 m)    Wt 237 lb (107.5 kg)    SpO2 97%    BMI 31.27 kg/m  Nursing note and vital signs reviewed.  Physical Exam Constitutional:      General: He is not in acute distress.    Appearance: He is well-developed.  Eyes:     Conjunctiva/sclera: Conjunctivae normal.  Cardiovascular:     Rate and Rhythm: Normal rate and regular rhythm.     Heart sounds: Normal heart sounds. No murmur heard.   No friction rub. No gallop.  Pulmonary:     Effort: Pulmonary effort is normal. No respiratory distress.     Breath sounds: Normal breath sounds. No wheezing or rales.  Chest:     Chest wall: No tenderness.  Abdominal:     General: Bowel sounds are normal.     Palpations: Abdomen is soft.     Tenderness: There is no abdominal tenderness.  Musculoskeletal:     Cervical back: Neck supple.  Lymphadenopathy:     Cervical: No cervical adenopathy.  Skin:    General: Skin is warm and dry.     Findings: No rash.  Neurological:     Mental Status: He is alert and oriented to person, place, and time.  Psychiatric:        Behavior: Behavior normal.        Thought Content: Thought content normal.        Judgment: Judgment normal.     Depression screen [facility] 2/9 10/25/2021 06/09/2021 02/23/2021 12/29/2020 11/28/2020  Decreased Interest 0 0 0 0 0  Down, Depressed, Hopeless 0 1 0 0 1  PHQ - 2 Score 0 1 0 0 1  Altered sleeping - 0 - - -  Tired, decreased energy - 0 - - -  Change in appetite - 0 - - -  Feeling bad or failure about yourself  - 0 - - -  Trouble concentrating - 0 - - -  Moving slowly or fidgety/restless - 0 - - -  Suicidal thoughts - 0 - - -  PHQ-9 Score - 1 - - -  Difficult doing work/chores - Somewhat difficult - - -       Assessment & Plan:    Patient  Active Problem List   Diagnosis Date Noted   Syphilis 11/28/2020   Healthcare maintenance 11/28/2020   Genital warts 10/21/2020   Anxiety disorder, unspecified 10/21/2020   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 ([place]) 10/18/2020   Screening examination for venereal disease 07/14/2015   Encounter for long-term (current) use of medications 07/14/2015   Polysubstance dependence including opioid type drug, episodic abuse ([place]) 09/06/2014   Major depression, recurrent ([place]) 09/06/2014   ETOH abuse 09/04/2014   Major depressive disorder, recurrent episode, moderate ([place]) 09/04/2014  Generalized anxiety disorder 09/04/2014   Alcohol abuse 02/16/2014   Cocaine abuse ([place]) 02/16/2014   Opioid abuse with opioid-induced mood disorder ([place]) 02/16/2014   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  No signs/symptoms of opportunistic infection.  We reviewed previous lab work and discussed plan of care.  Check blood work today.  Continue current dose of Biktarvy.  Renew financial assistance.  Plan for follow-up in 4 months or sooner if needed with lab work on the same day.      Relevant Medications   bictegravir-emtricitabine-tenofovir AF (BIKTARVY) 50-200-25 MG TABS tablet   Other Relevant Orders   Comprehensive metabolic panel   HIV-1 RNA quant-no reflex-bld   T-helper cells (CD4) count (not at [facility])   Syphilis    Henry [name] was previously treated at 1:64 with most recent titer of 1:8. Recheck RPR and continue to monitor.       Relevant Medications   bictegravir-emtricitabine-tenofovir AF (BIKTARVY) 50-200-25 MG TABS tablet    Other Relevant Orders   RPR   Healthcare maintenance    Discussed importance of safe sexual practices and condom use.  Condoms offered. Declines vaccinations.      Other Visit Diagnoses     Pharmacologic therapy       Relevant Orders   Lipid panel        I have discontinued Coralyn Mark Yeakel's nicotine, hydrocortisone cream, hydrOXYzine, and hydrOXYzine. I am also having him maintain his ibuprofen, mirtazapine, metoprolol succinate, QUEtiapine, lidocaine, busPIRone, and bictegravir-emtricitabine-tenofovir AF.   Meds ordered this encounter  Medications   bictegravir-emtricitabine-tenofovir AF (BIKTARVY) 50-200-25 MG TABS tablet    Sig: Take 1 tablet by mouth daily.    Dispense:  30 tablet    Refill:  5    Order Specific Question:   Supervising Provider    Answer:   Carlyle Basques [[id number]]     Follow-up: Return in about 4 months (around 02/22/2022), or if symptoms worsen or fail to improve.   Terri Piedra, MSN, FNP-C Nurse Practitioner [facility] for Infect[facility] Disease [place] number: [phone]

## 2021-10-25 NOTE — Assessment & Plan Note (Signed)
Henry [name] was previously treated at 1:64 with most recent titer of 1:8. Recheck RPR and continue to monitor.

## 2021-10-25 NOTE — Assessment & Plan Note (Signed)
Henry [name] continues to have well-controlled virus with good adherence and tolerance to his ART regimen of Biktarvy.  No signs/symptoms of opportunistic infection.  We reviewed previous lab work and discussed plan of care.  Check blood work today.  Continue current dose of Biktarvy.  Renew financial assistance.  Plan for follow-up in 4 months or sooner if needed with lab work on the same day.

## 2021-10-25 NOTE — Patient Instructions (Signed)
Nice to see you.  We will check your lab work today.  Continue to take your medication daily as prescribed.  Refills have been sent to the pharmacy.  Plan for follow up in 4 months or sooner if needed with lab work on the same day.  Have a great day and stay safe!  

## 2021-10-25 NOTE — Assessment & Plan Note (Signed)
·   Discussed importance of safe sexual practices and condom use.  Condoms offered.  Declines vaccinations.  

## 2021-10-27 LAB — COMPREHENSIVE METABOLIC PANEL
AG Ratio: 1.7 (calc) (ref 1.0–2.5)
ALT: 19 U/L (ref 9–46)
AST: 27 U/L (ref 10–40)
Albumin: 4.4 g/dL (ref 3.6–5.1)
Alkaline phosphatase (APISO): 41 U/L (ref 36–130)
BUN: 14 mg/dL (ref 7–25)
CO2: 26 mmol/L (ref 20–32)
Calcium: 8.9 mg/dL (ref 8.6–10.3)
Chloride: 108 mmol/L (ref 98–110)
Creat: 1.01 mg/dL (ref 0.60–1.29)
Globulin: 2.6 g/dL (calc) (ref 1.9–3.7)
Glucose, Bld: 86 mg/dL (ref 65–99)
Potassium: 4.1 mmol/L (ref 3.5–5.3)
Sodium: 139 mmol/L (ref 135–146)
Total Bilirubin: 0.4 mg/dL (ref 0.2–1.2)
Total Protein: 7 g/dL (ref 6.1–8.1)

## 2021-10-27 LAB — T-HELPER CELLS (CD4) COUNT (NOT AT ARMC)
Absolute CD4: 681 cells/uL (ref 490–1740)
CD4 T Helper %: 32 % (ref 30–61)
Total lymphocyte count: 2114 cells/uL (ref 850–3900)

## 2021-10-27 LAB — RPR TITER: RPR Titer: 1:2 {titer} — ABNORMAL HIGH

## 2021-10-27 LAB — LIPID PANEL
Cholesterol: 156 mg/dL (ref <200)
HDL: 32 mg/dL — ABNORMAL LOW (ref >=40)
LDL Cholesterol (Calc): 98 mg/dL (calc)
Non-HDL Cholesterol (Calc): 124 mg/dL (calc) (ref <130)
Total CHOL/HDL Ratio: 4.9 (calc) (ref <5.0)
Triglycerides: 162 mg/dL — ABNORMAL HIGH (ref <150)

## 2021-10-27 LAB — FLUORESCENT TREPONEMAL AB(FTA)-IGG-BLD: Fluorescent Treponemal ABS: REACTIVE — AB

## 2021-10-27 LAB — RPR: RPR Ser Ql: REACTIVE — AB

## 2021-10-27 LAB — HIV-1 RNA QUANT-NO REFLEX-BLD
HIV 1 RNA Quant: 29 Copies/mL — ABNORMAL HIGH
HIV-1 RNA Quant, Log: 1.46 Log cps/mL — ABNORMAL HIGH

## 2021-11-21 ENCOUNTER — Other Ambulatory Visit: Payer: Self-pay | Admitting: Family

## 2021-11-21 DIAGNOSIS — B20 Human immunodeficiency virus [HIV] disease: Secondary | ICD-10-CM

## 2022-02-15 NOTE — Progress Notes (Signed)
Patient ID: Henry [name], male   DOB: 1981-07-15, 41 y.o.   MRN: [id number] Received medical record request from [facility]  Stating patient has moved there requesting records sent to record dept

## 2022-02-22 ENCOUNTER — Ambulatory Visit: Payer: Self-pay | Admitting: Family

## 2022-03-12 ENCOUNTER — Other Ambulatory Visit: Payer: Self-pay | Admitting: Family

## 2022-03-12 NOTE — Telephone Encounter (Signed)
Please advise 

## 2023-01-10 ENCOUNTER — Ambulatory Visit (INDEPENDENT_AMBULATORY_CARE_PROVIDER_SITE_OTHER): Payer: Medicaid Other | Admitting: Internal Medicine

## 2023-01-10 ENCOUNTER — Encounter: Payer: Self-pay | Admitting: Internal Medicine

## 2023-01-10 ENCOUNTER — Other Ambulatory Visit: Payer: Self-pay

## 2023-01-10 VITALS — BP 138/88 | HR 84 | Resp 16 | Ht 73.0 in | Wt 236.0 lb

## 2023-01-10 DIAGNOSIS — Z5900 Homelessness unspecified: Secondary | ICD-10-CM | POA: Diagnosis not present

## 2023-01-10 DIAGNOSIS — B20 Human immunodeficiency virus [HIV] disease: Secondary | ICD-10-CM

## 2023-01-10 MED ORDER — BICTEGRAVIR-EMTRICITAB-TENOFOV 50-200-25 MG PO TABS: 1.0000 | ORAL_TABLET | Freq: Every day | ORAL | 11 refills | Status: DC

## 2023-01-10 NOTE — Progress Notes (Signed)
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) 10/18/2020   Screening examination for venereal disease 07/14/2015   Encounter for long-term (current) use of medications 07/14/2015   Polysubstance dependence including opioid type drug, episodic abuse (HCC) 09/06/2014   Major depression, recurrent (HCC) 09/06/2014   ETOH abuse 09/04/2014   Major depressive disorder, recurrent episode, moderate (HCC) 09/04/2014   Generalized anxiety disorder 09/04/2014   Alcohol abuse 02/16/2014   Cocaine abuse (HCC) 02/16/2014   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      HPI: Henry [name] is a 42 y.o. male depression/anxiety, bipolar, hx substance abuse, hiv here for establishing hiv care    01/10/2023 initial visit with me: OSH chart from [facility] in [place], [place] #XBJ Dx'ed in [facility] 2007-2008 Risk MSM Cd4 nadir: Hx OI -- none No hx of AIDS Previously seen at [facility] prior to moving to a different town in [place] Therapy:  2020-c Biktarvy Previously on stribild, genvoya Genotype: Hx k103n per previous chart  Very compliant when he is on meds -- no missed dose the last 4 weeks of biktarvy   #hx 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 with 3 weekly bicilin which he finished   #genital warts Imiquimod 5% cream three times weekly -- currently not needing to  use   #depression/anxiety #bipolar #insomnia Buspirone 15 mg tid Hydroxyzine 25 mg tid prn melatonin  #hx etoh and INDU (cocaine), but no IVDU   #hypertension On metop xl 50 mg daily   #joint pain Nothing severe Ibuprofen He uses from his friend's gabapentin which helps    Other meds: Mvi Vit c Vit E  #social -lives with a friend in [place] but about to move out; he has no permanent place  -sister is in [place] -not in current relationship; last time sexually active about early 2023 -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 substance use -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 -no pets/animals -prior travels -- [place], [place] state, [place]; none outside of the [place]    Review of Systems: ROS All other ros negative      Past Medical History:  Diagnosis Date   Anxiety    Bipolar 1 disorder (HCC)    HIV (human immunodeficiency virus infection) (HCC)     Social History   Tobacco Use   Smoking status: Every Day    Packs/day: 1.00    Years: 14.00    Additional pack years: 0.00    Total pack years: 14.00    Types: Cigarettes   Smokeless tobacco: Never  Vaping Use   Vaping Use: Never used  Substance Use Topics   Alcohol use: Not Currently    Alcohol/week: 0.0 standard drinks of alcohol    Comment: 2 a day off and on   Drug use: Not Currently    Types: Marijuana, Cocaine, Amphetamines, Benzodiazepines, "Crack" cocaine    Comment: [facility] - sober for about 1 month     Family History  Problem Relation  Age of Onset   Thyroid disease Mother    Heart disease Maternal Grandmother    Breast cancer Maternal Grandmother     No Known Allergies  OBJECTIVE: Vitals:   01/10/23 1508  BP: 138/88  Pulse: 84  Resp: 16  SpO2: 100%  Weight: 236 lb (107 kg)  Height: 6\' 1"  (1.854 m)   There is no height or weight on file to calculate BMI.   Physical Exam General/constitutional: no distress, pleasant HEENT: Normocephalic, PER, Conj Clear, EOMI, Oropharynx clear Neck supple CV:  rrr no mrg Lungs: clear to auscultation, normal respiratory effort Abd: Soft, Nontender Ext: no edema Skin: No Rash Neuro: nonfocal MSK: no peripheral joint swelling/tenderness/warmth; back spines nontender   Lab:   HIV: 10/29/22           562  (31%)    /       <20 02/2022                                  /      <20 03/2020            524  (31%)    /  HIV genotype: 2018 rcid genotype testing no PI or nrti/nnrti resistance   Microbiology:  Serology: 10/2021 rpr 1:2 02/2021 rpr 1:8  Imaging:   Assessment/plan: Problem List Items Addressed This Visit       Other   HIV disease (HCC) - Primary    #hiv [facility] Well controlled on biktarvy  Last labs early 2024 and won't need till late this year  Discussed reprieve study 01/10/23; patient not interested in statin at this time   He has medicaid as of 01/10/23   -discussed u=u -encourage compliance -continue current HIV medication -labs 6 months -f/u in 6 months    #hx syphilis Treated as late latent in 2022 with bicillin Titer improving 1:8 --> 1:2 see above in labs   #hcm -vaccine Will review -std testing Patient wants to defer today -cancer screening Patient has had no anal pap before; would be willing to --> plan to do later this year  I have spent a total of 65 minutes of face-to-face and non-face-to-face time, excluding clinical staff time, preparing to see patient, ordering tests and/or medications, and provide counseling the patient    Follow-up: Return in about 6 months (around 07/13/2023).  [name], MD [facility] 01/10/2023, 2:52 PM

## 2023-01-10 NOTE — Patient Instructions (Signed)
You are doing very well with your Biktarvy   No labs needed today; will do in 6 months visit   Will discuss with thp to see if any housing resource available   Will also provide primary care clinic information for you to sign

## 2023-01-10 NOTE — Addendum Note (Signed)
Addended by: Clayborne Artist A on: 01/10/2023 03:34 PM   Modules accepted: Orders

## 2023-04-04 ENCOUNTER — Telehealth: Payer: Self-pay

## 2023-04-04 DIAGNOSIS — Z0000 Encounter for general adult medical examination without abnormal findings: Secondary | ICD-10-CM

## 2023-04-04 MED ORDER — LIDOCAINE 5 % EX OINT: TOPICAL_OINTMENT | CUTANEOUS | 0 refills | Status: DC

## 2023-04-04 MED ORDER — METOPROLOL SUCCINATE ER 50 MG PO TB24: 50.0000 mg | ORAL_TABLET | Freq: Every day | ORAL | 0 refills | Status: DC

## 2023-04-04 NOTE — Addendum Note (Signed)
Addended by: Linna Hoff D on: 04/04/2023 12:21 PM   Modules accepted: Orders

## 2023-04-04 NOTE — Telephone Encounter (Signed)
Patient called requesting refills of lidocaine patch and metoprolol be sent to [facility] on [address].   Metoprolol is listed in his chart as 100 mg dose, but he states he's on 50 mg. He does not have a PCP, is unsure of who has been prescribing this is the past. Last prescription per chart review was for 100 mg dose in March 2022. A 25 mg dose was dispensed 02/2021.  Sandie Ano, RN

## 2023-04-04 NOTE — Telephone Encounter (Signed)
We can refill once and let him know primary care needs to be established for this  Thanks

## 2023-05-09 ENCOUNTER — Telehealth: Payer: Self-pay

## 2023-05-09 ENCOUNTER — Other Ambulatory Visit: Payer: Self-pay

## 2023-05-09 MED ORDER — METOPROLOL SUCCINATE ER 50 MG PO TB24: 50.0000 mg | ORAL_TABLET | Freq: Every day | ORAL | 3 refills | Status: DC

## 2023-05-09 NOTE — Telephone Encounter (Signed)
Patient called office stating he is having issues filling Biktarvy. Is also requesting refill on Metoprolol. Per Dr. Renold Don okay to send in refill. Spoke with [employer] staff who states Susanne Borders is ready with no copay.  Patient updated. Juanita Laster, RMA

## 2023-05-17 NOTE — Progress Notes (Signed)
The 10-year ASCVD risk score (Arnett DK, et al., 2019) is: 11.9%   Values used to calculate the score:     Age: 42 years     Sex: Male     Is Non-Hispanic African American: Yes     Diabetic: No     Tobacco smoker: Yes     Systolic Blood Pressure: 138 mmHg     Is BP treated: Yes     HDL Cholesterol: 32 mg/dL     Total Cholesterol: 156 mg/dL  Sandie Ano, RN

## 2023-06-03 ENCOUNTER — Telehealth: Payer: Self-pay

## 2023-06-03 NOTE — Telephone Encounter (Signed)
Patient called office today requesting new prescription on IMIQUIMOD 5% CREAM SINGLE USE PKTS. Last rx was filled while patient was in recovery. Does not have a PCP who can fill this for him. Advised he establish care with PCP for further management.  Would like rx sent to [facility] on [address]. Juanita Laster, RMA

## 2023-06-04 ENCOUNTER — Other Ambulatory Visit: Payer: Self-pay | Admitting: Internal Medicine

## 2023-06-04 DIAGNOSIS — A630 Anogenital (venereal) warts: Secondary | ICD-10-CM

## 2023-06-04 MED ORDER — IMIQUIMOD 5 % EX CREA: TOPICAL_CREAM | CUTANEOUS | 0 refills | Status: DC

## 2023-06-04 NOTE — Telephone Encounter (Signed)
Patient notified

## 2023-07-11 ENCOUNTER — Ambulatory Visit: Payer: Self-pay

## 2023-07-11 ENCOUNTER — Encounter: Payer: Self-pay | Admitting: Internal Medicine

## 2023-07-11 ENCOUNTER — Ambulatory Visit: Payer: Self-pay | Admitting: Internal Medicine

## 2023-07-11 ENCOUNTER — Other Ambulatory Visit: Payer: Self-pay

## 2023-07-11 ENCOUNTER — Ambulatory Visit (INDEPENDENT_AMBULATORY_CARE_PROVIDER_SITE_OTHER): Payer: MEDICAID | Admitting: Internal Medicine

## 2023-07-11 VITALS — BP 125/88 | HR 65 | Temp 98.2°F | Resp 16 | Wt 217.0 lb

## 2023-07-11 DIAGNOSIS — Z23 Encounter for immunization: Secondary | ICD-10-CM | POA: Diagnosis not present

## 2023-07-11 DIAGNOSIS — Z113 Encounter for screening for infections with a predominantly sexual mode of transmission: Secondary | ICD-10-CM

## 2023-07-11 DIAGNOSIS — B20 Human immunodeficiency virus [HIV] disease: Secondary | ICD-10-CM | POA: Diagnosis not present

## 2023-07-11 NOTE — Progress Notes (Signed)
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) 10/18/2020   Screening examination for venereal disease 07/14/2015   Encounter for long-term (current) use of medications 07/14/2015   Polysubstance dependence including opioid type drug, episodic abuse (HCC) 09/06/2014   Major depression, recurrent (HCC) 09/06/2014   ETOH abuse 09/04/2014   Major depressive disorder, recurrent episode, moderate (HCC) 09/04/2014   Generalized anxiety disorder 09/04/2014   Alcohol abuse 02/16/2014   Cocaine abuse (HCC) 02/16/2014   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      HPI: Henry [name] is a 42 y.o. 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 in [place], [place] #EPP Dx'ed in [facility] 2007-2008 Risk MSM Cd4 nadir: Hx OI -- none No hx of AIDS Previously seen at [facility] prior to moving to a different town in [place] Therapy:  2020-c Biktarvy Previously on stribild, genvoya Genotype: Hx k103n per  previous chart  Very compliant when he is on meds -- no missed dose the last 4 weeks of biktarvy   #hx 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 with 3 weekly bicilin which he finished   #genital warts Imiquimod 5% cream three times weekly -- currently not needing to use   #depression/anxiety #bipolar #insomnia Buspirone 15 mg tid Hydroxyzine 25 mg tid prn melatonin  #hx etoh and INDU (cocaine), but no IVDU   #hypertension On metop xl 50 mg daily   #joint pain Nothing severe Ibuprofen He uses from his friend's gabapentin which helps    Other meds: Mvi Vit c Vit E  #social -lives with a friend in [place] but about to move out; he has no permanent place  -sister is in [place] -not in current relationship; last time sexually active about early 2023 -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 substance use -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 -no pets/animals -prior travels -- [place], [place] state, [place]; none outside of the [place]    Review of Systems: ROS All other ros negative      Past Medical History:  Diagnosis Date   Anxiety    Bipolar 1 disorder (HCC)    HIV (human immunodeficiency virus infection) (HCC)     Social History   Tobacco Use   Smoking status: Every Day    Current packs/day: 1.00    Average packs/day: 1 pack/day for 15.0 years (15.0 ttl pk-yrs)    Types: Cigarettes   Smokeless tobacco: Never  Vaping Use   Vaping status: Never Used  Substance Use Topics   Alcohol use: Never   Drug use: Not  Currently    Types: Marijuana, Cocaine, Amphetamines, Benzodiazepines, "Crack" cocaine    Comment: Sober x 2 years 4 months (01/2023)    Family History  Problem Relation Age of Onset   Thyroid disease Mother    Thyroid disease Sister    Heart disease Maternal Grandmother    Breast cancer Maternal Grandmother     No Known Allergies  OBJECTIVE: Vitals:   07/11/23 1053  BP: 125/88  Pulse: 65  Resp: 16  Temp: 98.2 F (36.8 C)  TempSrc: Temporal   SpO2: 98%  Weight: 217 lb (98.4 kg)   Body mass index is 28.63 kg/m.   Physical Exam General/constitutional: no distress, pleasant HEENT: Normocephalic, PER, Conj Clear, EOMI, Oropharynx clear Neck supple CV: rrr no mrg Lungs: clear to auscultation, normal respiratory effort Abd: Soft, Nontender Ext: no edema Skin: No Rash Neuro: nonfocal MSK: no peripheral joint swelling/tenderness/warmth; back spines nontender   Lab:   HIV: 10/29/22           562  (31%)    /       <20 02/2022                                  /      <20 03/2020            524  (31%)    /  HIV genotype: 2018 rcid genotype testing no PI or nrti/nnrti resistance   Microbiology:  Serology: 10/2021 rpr 1:2 02/2021 rpr 1:8  Imaging:   Assessment/plan: Problem List Items Addressed This Visit       Other   HIV disease (HCC) - Primary   Relevant Orders   COMPLETE METABOLIC PANEL WITH GFR   CBC with Differential/Platelet   HIV-1 RNA quant-no reflex-bld   RPR   T-helper cell (CD4)- ([facility] clinic only)   Urine cytology ancillary only   Cytology (oral, anal, urethral) ancillary only   Cytology (oral, anal, urethral) ancillary only   Screening examination for venereal disease   Relevant Orders   RPR   Urine cytology ancillary only   Cytology (oral, anal, urethral) ancillary only   Cytology (oral, anal, urethral) ancillary only    #hiv Msm Well controlled on biktarvy  Last labs early 2024 and won't need till late this year  Discussed reprieve study 01/10/23; patient not interested in statin at this time   He has medicaid as of 01/10/23  Doing well on biktarvy   -discussed u=u -encourage compliance -continue current HIV medication -labs 6 months and today -f/u in 6 months    #hx syphilis Treated as late latent in 2022 with bicillin Titer improving 1:8 --> 1:2 see above in labs  -repeat rpr today and triple std screen   #hcm -vaccine Will review -std testing Triple screen  and rpr today 07/11/23 -cancer screening Patient has had no anal pap before; would be willing to --> he wants to defer today and do next visit though    Follow-up: Return in about 6 months (around 01/08/2024).  [name], MD [facility] 07/11/2023, 11:08 AM

## 2023-07-11 NOTE — Patient Instructions (Signed)
We'll do anal pap next visit   Think about cholesterol medication (you can google "reprieve trial nejm hiv") to reduce risk heart attack/stroke   See you in 6 months otherwise

## 2023-07-12 LAB — URINE CYTOLOGY ANCILLARY ONLY
Chlamydia: NEGATIVE
Comment: NEGATIVE
Comment: NORMAL
Neisseria Gonorrhea: NEGATIVE

## 2023-07-12 LAB — CYTOLOGY, (ORAL, ANAL, URETHRAL) ANCILLARY ONLY
Chlamydia: NEGATIVE
Chlamydia: NEGATIVE
Comment: NEGATIVE
Comment: NEGATIVE
Comment: NORMAL
Comment: NORMAL
Neisseria Gonorrhea: NEGATIVE
Neisseria Gonorrhea: NEGATIVE

## 2023-07-12 LAB — T-HELPER CELL (CD4) - (RCID CLINIC ONLY)
CD4 % Helper T Cell: 34 % (ref 33–65)
CD4 T Cell Abs: 571 /uL (ref 400–1790)

## 2023-07-16 LAB — COMPLETE METABOLIC PANEL WITH GFR
AG Ratio: 2 (calc) (ref 1.0–2.5)
ALT: 26 U/L (ref 9–46)
AST: 25 U/L (ref 10–40)
Albumin: 4.3 g/dL (ref 3.6–5.1)
Alkaline phosphatase (APISO): 46 U/L (ref 36–130)
BUN: 11 mg/dL (ref 7–25)
CO2: 25 mmol/L (ref 20–32)
Calcium: 8.9 mg/dL (ref 8.6–10.3)
Chloride: 109 mmol/L (ref 98–110)
Creat: 0.96 mg/dL (ref 0.60–1.29)
Globulin: 2.2 g/dL (ref 1.9–3.7)
Glucose, Bld: 94 mg/dL (ref 65–99)
Potassium: 4 mmol/L (ref 3.5–5.3)
Sodium: 141 mmol/L (ref 135–146)
Total Bilirubin: 0.3 mg/dL (ref 0.2–1.2)
Total Protein: 6.5 g/dL (ref 6.1–8.1)
eGFR: 101 mL/min/{1.73_m2} (ref >=60)

## 2023-07-16 LAB — CBC WITH DIFFERENTIAL/PLATELET
Absolute Lymphocytes: 1739 {cells}/uL (ref 850–3900)
Absolute Monocytes: 291 {cells}/uL (ref 200–950)
Basophils Absolute: 51 {cells}/uL (ref 0–200)
Basophils Relative: 1 %
Eosinophils Absolute: 801 {cells}/uL — ABNORMAL HIGH (ref 15–500)
Eosinophils Relative: 15.7 %
HCT: 41.5 % (ref 38.5–50.0)
Hemoglobin: 14.3 g/dL (ref 13.2–17.1)
MCH: 33.4 pg — ABNORMAL HIGH (ref 27.0–33.0)
MCHC: 34.5 g/dL (ref 32.0–36.0)
MCV: 97 fL (ref 80.0–100.0)
MPV: 9.3 fL (ref 7.5–12.5)
Monocytes Relative: 5.7 %
Neutro Abs: 2219 {cells}/uL (ref 1500–7800)
Neutrophils Relative %: 43.5 %
Platelets: 212 10*3/uL (ref 140–400)
RBC: 4.28 10*6/uL (ref 4.20–5.80)
RDW: 11.7 % (ref 11.0–15.0)
Total Lymphocyte: 34.1 %
WBC: 5.1 10*3/uL (ref 3.8–10.8)

## 2023-07-16 LAB — T PALLIDUM AB: T Pallidum Abs: POSITIVE — AB

## 2023-07-16 LAB — HIV-1 RNA QUANT-NO REFLEX-BLD
HIV 1 RNA Quant: 20 {copies}/mL — ABNORMAL HIGH
HIV-1 RNA Quant, Log: 1.31 {Log_copies}/mL — ABNORMAL HIGH

## 2023-07-16 LAB — RPR TITER: RPR Titer: 1:1 {titer} — ABNORMAL HIGH

## 2023-07-16 LAB — RPR: RPR Ser Ql: REACTIVE — AB

## 2023-09-12 ENCOUNTER — Other Ambulatory Visit: Payer: Self-pay | Admitting: Internal Medicine

## 2023-09-16 NOTE — Telephone Encounter (Signed)
 Are you okay to refill this for now? Cece sent him a MyChart message but he hasn't read it and we don't have a phone number to reach him at.

## 2023-10-10 ENCOUNTER — Other Ambulatory Visit: Payer: Self-pay | Admitting: Internal Medicine

## 2023-10-14 ENCOUNTER — Telehealth: Payer: Self-pay

## 2023-10-14 NOTE — Telephone Encounter (Signed)
 Patient called requesting refill of metoprolol . Notified him that three refills were sent 09/16/23 and encouraged him to call [facility].   Amethyst Gainer D Syliva Mee, RN

## 2023-12-18 ENCOUNTER — Telehealth: Payer: Self-pay

## 2023-12-18 DIAGNOSIS — F419 Anxiety disorder, unspecified: Secondary | ICD-10-CM

## 2023-12-18 MED ORDER — HYDROXYZINE HCL 25 MG PO TABS: ORAL_TABLET | Freq: Three times a day (TID) | ORAL | 2 refills | Status: DC | PRN

## 2023-12-18 MED ORDER — BUSPIRONE HCL 10 MG PO TABS: 20.0000 mg | ORAL_TABLET | Freq: Three times a day (TID) | ORAL | 2 refills | Status: DC

## 2023-12-18 NOTE — Telephone Encounter (Signed)
 Go ahead and let him have a couple months Meg  Thanks

## 2023-12-18 NOTE — Addendum Note (Signed)
 Addended by: Arlon Bergamo D on: 12/18/2023 01:32 PM   Modules accepted: Orders

## 2023-12-18 NOTE — Telephone Encounter (Signed)
 Patient called requesting refills of the following:   Buspirone 10 mg tablets (take 20 mg PO TID)  Hydroxyzine 25 mg capsules (take 1 capsule PO up to three times a day)    States he has not been able to connect with his mental healthcare provider from [facility] and he's experiencing some life struggles at the moment and feels getting back on his anti-anxiety medication would be beneficial for him. Will route to provider.   Patient uses [facility] on [address].   Takelia Urieta, BSN, RN

## 2023-12-18 NOTE — Telephone Encounter (Signed)
 Refills sent. Called Zachari to let him know, no answer. Left HIPAA compliant voicemail stating medications were sent to the pharmacy and to call with any questions.   Sumire Halbleib, BSN, RN

## 2024-01-07 ENCOUNTER — Ambulatory Visit (INDEPENDENT_AMBULATORY_CARE_PROVIDER_SITE_OTHER): Payer: MEDICAID | Admitting: Internal Medicine

## 2024-01-07 ENCOUNTER — Other Ambulatory Visit: Payer: Self-pay

## 2024-01-07 ENCOUNTER — Encounter: Payer: Self-pay | Admitting: Internal Medicine

## 2024-01-07 VITALS — BP 141/88 | HR 76 | Temp 98.1°F | Ht 73.0 in | Wt 220.0 lb

## 2024-01-07 DIAGNOSIS — Z113 Encounter for screening for infections with a predominantly sexual mode of transmission: Secondary | ICD-10-CM

## 2024-01-07 DIAGNOSIS — B20 Human immunodeficiency virus [HIV] disease: Secondary | ICD-10-CM | POA: Diagnosis not present

## 2024-01-07 DIAGNOSIS — Z129 Encounter for screening for malignant neoplasm, site unspecified: Secondary | ICD-10-CM

## 2024-01-07 DIAGNOSIS — Z1159 Encounter for screening for other viral diseases: Secondary | ICD-10-CM

## 2024-01-07 DIAGNOSIS — Z111 Encounter for screening for respiratory tuberculosis: Secondary | ICD-10-CM

## 2024-01-07 NOTE — Addendum Note (Signed)
 Addended by: Arlon Bergamo D on: 01/07/2024 10:59 AM   Modules accepted: Orders

## 2024-01-07 NOTE — Progress Notes (Signed)
 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) 10/18/2020   Screening examination for venereal disease 07/14/2015   Encounter for long-term (current) use of medications 07/14/2015   Polysubstance dependence including opioid type drug, episodic abuse (HCC) 09/06/2014   Major depression, recurrent (HCC) 09/06/2014   ETOH abuse 09/04/2014   Major depressive disorder, recurrent episode, moderate (HCC) 09/04/2014   Generalized anxiety disorder 09/04/2014   Alcohol abuse 02/16/2014   Cocaine abuse (HCC) 02/16/2014   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 male depression/anxiety, bipolar, hx substance abuse, hiv here for establishing hiv care  01/07/24 id clinic f/u Social -- sexually active/not in relationship. 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 No issue with biktarvy   Lives with his same friend Sexually active - not in a steady relationship  Recent left thumb paronychia but resolved  Had stopped hyroxyzine as he thinks he has night terror    --------------------  01/10/2023 initial visit with me: OSH chart from [facility] in [place], [place] #ZOX Dx'ed in [facility]  2007-2008 Risk MSM Cd4 nadir: Hx OI -- none No hx of AIDS Previously seen at [facility] prior to moving to a different town in [place] Therapy:  2020-c Biktarvy  Previously on stribild , genvoya  Genotype: Hx k103n per previous chart  Very compliant when he is on meds -- no missed dose the last 4 weeks of biktarvy    #hx 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 with 3 weekly bicilin which he finished   #genital warts Imiquimod  5% cream three times weekly -- currently not needing to use   #depression/anxiety #bipolar #insomnia Buspirone  15 mg tid Hydroxyzine  25 mg tid prn melatonin  #hx etoh and INDU (cocaine), but no IVDU   #hypertension On metop xl 50 mg daily   #joint pain Nothing severe Ibuprofen  He uses from his friend's gabapentin which helps    Other meds: Mvi Vit c Vit E  #social -lives with a friend in [place] but about to move out; he has no permanent place  -sister is in [place] -not in current relationship; last time sexually active about early 2023 -no current substance use -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 -no pets/animals -prior travels -- [place] , [place], [place]; none outside of the [place]     Review of Systems: ROS All other ros negative      Past Medical History:  Diagnosis Date   Anxiety    Bipolar 1 disorder (HCC)    HIV (human immunodeficiency virus infection) (HCC)     Social History   Tobacco Use   Smoking status: Every Day    Current packs/day: 1.00    Average packs/day: 1 pack/day  for 15.0 years (15.0 ttl pk-yrs)    Types: Cigarettes   Smokeless tobacco: Never  Vaping Use   Vaping status: Never Used  Substance Use Topics   Alcohol use: Never   Drug use: Not Currently    Types: Marijuana, Cocaine, Amphetamines, Benzodiazepines, "Crack" cocaine    Comment: Sober x 2 years 4 months (01/2023)    Family History  Problem Relation Age of Onset   Thyroid disease Mother    Thyroid disease Sister    Heart  disease Maternal Grandmother    Breast cancer Maternal Grandmother     No Known Allergies  OBJECTIVE: Vitals:   01/07/24 1022  BP: (!) 141/88  Pulse: 76  Temp: 98.1 F (36.7 C)  TempSrc: Temporal  SpO2: 98%  Weight: 220 lb (99.8 kg)  Height: 6\' 1"  (1.854 m)   Body mass index is 29.03 kg/m.   Physical Exam General/constitutional: no distress, pleasant HEENT: Normocephalic, PER, Conj Clear, EOMI, Oropharynx clear Neck supple CV: rrr no mrg Lungs: clear to auscultation, normal respiratory effort Abd: Soft, Nontender Ext: no edema Skin: No Rash Neuro: nonfocal MSK: no peripheral joint swelling/tenderness/warmth; back spines nontender   Lab: Lab Results  Component Value Date   WBC 5.1 07/11/2023   HGB 14.3 07/11/2023   HCT 41.5 07/11/2023   MCV 97.0 07/11/2023   PLT 212 07/11/2023   Last metabolic panel Lab Results  Component Value Date   GLUCOSE 94 07/11/2023   NA 141 07/11/2023   K 4.0 07/11/2023   CL 109 07/11/2023   CO2 25 07/11/2023   BUN 11 07/11/2023   CREATININE 0.96 07/11/2023   EGFR 101 07/11/2023   CALCIUM 8.9 07/11/2023   PROT 6.5 07/11/2023   ALBUMIN 2.9 (L) 10/19/2020   BILITOT 0.3 07/11/2023   ALKPHOS 61 10/19/2020   AST 25 07/11/2023   ALT 26 07/11/2023   ANIONGAP 5 10/19/2020     HIV: 07/2023            571 (34%)     /      <20 10/29/22           562  (31%)    /       <20 02/2022                                  /      <20 03/2020            524  (31%)    /  HIV genotype: 2018 rcid genotype testing no PI or nrti/nnrti resistance   Microbiology:  Serology: 10/2021 rpr 1:2 02/2021 rpr 1:8  Imaging:   Assessment/plan: Problem List Items Addressed This Visit   None    #hiv Msm Well controlled on biktarvy   Last labs early 2024 and won't need till late this year  Discussed reprieve study 01/10/23; patient not interested in statin at this time   He has medicaid as of 01/10/23  Doing well on biktarvy . No question  today 01/07/24   -discussed u=u -encourage compliance -continue current HIV medication -labs today -f/u in 6 months    #hx syphilis Treated as late latent in 2022 with bicillin  Titer improving 1:8 --> 1:2 see above in labs  -repeat rpr today and triple std screen today 01/07/24   #hcm -vaccine Tdap 2019 Prevnar13 2022 Meningococcal booster 20022 -hepatitis Hep b vaccination twinrix 2016 Hep b serology 01/07/24 -metabolic Will  discuss -tb screening Quantiferon gold 01/07/24 -std testing Triple screen and rpr today 07/11/23 --> negative and serofast 1:1 -cancer screening Anal pap today -pcp care Encourage him to establish primary care -- patient haven't been looking    Follow-up: Return in about 6 months (around 07/09/2024).  Jamesetta Mcbride, MD [facility] 01/07/2024, 10:30 AM

## 2024-01-07 NOTE — Patient Instructions (Addendum)
 You are doing very well Continue biktarvy    Labs today including swabs    Follow up in 6 months  Will review your hep b response and see if you need vaccination for that next visit    [facility]: [email or web address]     Smoking Cessation: QuitlineNC 1-800-QUIT-NOW (281)878-9744); Espaol: 1-855-Djelo-Ya (1-775-826-0044) [email or web address]

## 2024-01-08 LAB — GC/CHLAMYDIA PROBE, AMP (THROAT)
Chlamydia trachomatis RNA: NOT DETECTED
Neisseria gonorrhoeae RNA: NOT DETECTED

## 2024-01-08 LAB — CT/NG RNA, TMA RECTAL
Chlamydia Trachomatis RNA: NOT DETECTED
Neisseria Gonorrhoeae RNA: NOT DETECTED

## 2024-01-08 LAB — C. TRACHOMATIS/N. GONORRHOEAE RNA
C. trachomatis RNA, TMA: NOT DETECTED
N. gonorrhoeae RNA, TMA: NOT DETECTED

## 2024-01-09 LAB — CYTOLOGY - NON PAP

## 2024-01-09 LAB — NON-GYN, SPECIMEN A

## 2024-01-10 ENCOUNTER — Other Ambulatory Visit: Payer: Self-pay | Admitting: Internal Medicine

## 2024-01-10 LAB — HEPATITIS B CORE ANTIBODY, TOTAL: Hep B Core Total Ab: NONREACTIVE

## 2024-01-10 LAB — HIV-1 RNA QUANT-NO REFLEX-BLD
HIV 1 RNA Quant: NOT DETECTED {copies}/mL
HIV-1 RNA Quant, Log: NOT DETECTED {Log_copies}/mL

## 2024-01-10 LAB — QUANTIFERON-TB GOLD PLUS
Mitogen-NIL: 9.12 [IU]/mL
NIL: 0.04 [IU]/mL
QuantiFERON-TB Gold Plus: NEGATIVE
TB1-NIL: 0 [IU]/mL
TB2-NIL: <0 [IU]/mL

## 2024-01-10 LAB — RPR: RPR Ser Ql: REACTIVE — AB

## 2024-01-10 LAB — RPR TITER: RPR Titer: 1:1 {titer} — ABNORMAL HIGH

## 2024-01-10 LAB — T PALLIDUM AB: T Pallidum Abs: POSITIVE — AB

## 2024-01-13 ENCOUNTER — Other Ambulatory Visit: Payer: Self-pay | Admitting: Internal Medicine

## 2024-01-15 NOTE — Progress Notes (Signed)
 The 10-year ASCVD risk score (Arnett DK, et al., 2019) is: 13.1%   Values used to calculate the score:     Age: 43 years     Sex: Male     Is Non-Hispanic African American: Yes     Diabetic: No     Tobacco smoker: Yes     Systolic Blood Pressure: 141 mmHg     Is BP treated: Yes     HDL Cholesterol: 32 mg/dL     Total Cholesterol: 156 mg/dL  Arlon Bergamo, BSN, RN

## 2024-01-21 ENCOUNTER — Ambulatory Visit: Payer: Self-pay | Admitting: Internal Medicine

## 2024-02-06 ENCOUNTER — Ambulatory Visit: Payer: MEDICAID

## 2024-02-12 ENCOUNTER — Other Ambulatory Visit: Payer: Self-pay | Admitting: Internal Medicine

## 2024-02-13 NOTE — Telephone Encounter (Signed)
 Okay to refill? Doesn't look like he has a PCP yet.

## 2024-02-18 ENCOUNTER — Other Ambulatory Visit: Payer: Self-pay

## 2024-02-18 ENCOUNTER — Ambulatory Visit: Payer: MEDICAID

## 2024-03-07 ENCOUNTER — Ambulatory Visit (HOSPITAL_COMMUNITY)
Admission: EM | Admit: 2024-03-07 | Discharge: 2024-03-07 | Disposition: A | Discharge status: Home / Self Care | Payer: MEDICAID | Attending: Internal Medicine | Admitting: Internal Medicine

## 2024-03-07 ENCOUNTER — Encounter (HOSPITAL_COMMUNITY): Payer: Self-pay

## 2024-03-07 ENCOUNTER — Other Ambulatory Visit: Payer: Self-pay

## 2024-03-07 ENCOUNTER — Ambulatory Visit (INDEPENDENT_AMBULATORY_CARE_PROVIDER_SITE_OTHER): Payer: MEDICAID

## 2024-03-07 DIAGNOSIS — F17210 Nicotine dependence, cigarettes, uncomplicated: Secondary | ICD-10-CM | POA: Diagnosis not present

## 2024-03-07 DIAGNOSIS — R0602 Shortness of breath: Secondary | ICD-10-CM

## 2024-03-07 DIAGNOSIS — J209 Acute bronchitis, unspecified: Secondary | ICD-10-CM

## 2024-03-07 MED ORDER — AEROCHAMBER PLUS FLO-VU LARGE MISC
Status: AC
  Filled 2024-03-07: qty 1

## 2024-03-07 MED ORDER — IPRATROPIUM-ALBUTEROL 0.5-2.5 (3) MG/3ML IN SOLN
RESPIRATORY_TRACT | Status: AC
  Filled 2024-03-07: qty 3

## 2024-03-07 MED ORDER — METHYLPREDNISOLONE SODIUM SUCC 125 MG IJ SOLR
INTRAMUSCULAR | Status: AC
  Filled 2024-03-07: qty 2

## 2024-03-07 MED ORDER — METHYLPREDNISOLONE SODIUM SUCC 125 MG IJ SOLR
80.0000 mg | Freq: Once | INTRAMUSCULAR | Status: AC
  Administered 2024-03-07: 80 mg via INTRAMUSCULAR

## 2024-03-07 MED ORDER — PROMETHAZINE-DM 6.25-15 MG/5ML PO SYRP: 5.0000 mL | ORAL_SOLUTION | Freq: Every evening | ORAL | 0 refills | Status: DC | PRN

## 2024-03-07 MED ORDER — ALBUTEROL SULFATE HFA 108 (90 BASE) MCG/ACT IN AERS
2.0000 | INHALATION_SPRAY | Freq: Once | RESPIRATORY_TRACT | Status: AC
  Administered 2024-03-07: 2 via RESPIRATORY_TRACT

## 2024-03-07 MED ORDER — ALBUTEROL SULFATE HFA 108 (90 BASE) MCG/ACT IN AERS
INHALATION_SPRAY | RESPIRATORY_TRACT | Status: AC
  Filled 2024-03-07: qty 6.7

## 2024-03-07 MED ORDER — DOXYCYCLINE HYCLATE 100 MG PO CAPS: 100.0000 mg | ORAL_CAPSULE | Freq: Two times a day (BID) | ORAL | 0 refills | Status: AC

## 2024-03-07 MED ORDER — PREDNISONE 20 MG PO TABS: 40.0000 mg | ORAL_TABLET | Freq: Every day | ORAL | 0 refills | Status: AC

## 2024-03-07 MED ORDER — IPRATROPIUM-ALBUTEROL 0.5-2.5 (3) MG/3ML IN SOLN
3.0000 mL | Freq: Once | RESPIRATORY_TRACT | Status: AC
  Administered 2024-03-07: 3 mL via RESPIRATORY_TRACT

## 2024-03-07 MED ORDER — AEROCHAMBER PLUS FLO-VU MEDIUM MISC
1.0000 | Freq: Once | Status: AC
  Administered 2024-03-07: 1

## 2024-03-07 NOTE — ED Provider Notes (Signed)
 [facility]    CSN: [id number] Arrival date & time: 03/07/24  1505      History   Chief Complaint Chief Complaint  Patient presents with   Cough   Wheezing    HPI Henry [name] is a 43 y.o. male.   Henry [name] is a 43 y.o. male presenting for chief complaint of Cough and Wheezing that started several months ago. Coughing and congestion has been intermittent and states he has taken OTC cough/cold medicines to make the symptoms go away but OTC medications have stopped working. Over the last 2 weeks cough and congestion has worsened significantly, symptoms are worse at nighttime and disrupt sleep.  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 Reports intermittent shortness of breath with coughing and with ambulating short distances. Phlegm is yellow, sometimes clear. He also hears wheezing/rattling to the chest. Smokes 1 pack of cigarettes per day currently. No history of asthma/copd.  No recent fevers, chills, nausea, vomiting, diarrhea, rash, or recent antibiotic/steroid use.     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) 10/18/2020   Screening examination for venereal disease 07/14/2015   Encounter for long-term (current) use of medications 07/14/2015   Polysubstance dependence including opioid type drug, episodic abuse (HCC) 09/06/2014   Major depression, recurrent (HCC) 09/06/2014   ETOH abuse 09/04/2014   Major depressive disorder, recurrent episode,  moderate (HCC) 09/04/2014   Generalized anxiety disorder 09/04/2014   Alcohol abuse 02/16/2014   Cocaine abuse (HCC) 02/16/2014   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 No pertinent surgical history.     Home Medications    Prior to Admission medications   Medication Sig Start Date End Date Taking? Authorizing Provider  doxycycline  (VIBRAMYCIN ) 100 MG capsule Take 1 capsule (100 mg total) by mouth 2 (two) times daily for 7 days. 03/07/24 03/14/24 Yes StanhopeDorna HERO, FNP  predniSONE  (DELTASONE ) 20 MG tablet Take 2 tablets (40 mg total) by mouth daily with breakfast for 5 days. 03/07/24 03/12/24 Yes StanhopeDorna HERO, FNP  promethazine -dextromethorphan (PROMETHAZINE -DM) 6.25-15 MG/5ML syrup Take 5 mLs by mouth at bedtime as needed for cough. 03/07/24  Yes Enedelia Dorna HERO, FNP  BIKTARVY  50-200-25 MG TABS tablet TAKE 1 TABLET BY MOUTH DAILY 01/10/24   Vu, Trung T, MD  busPIRone  (BUSPAR ) 10 MG tablet Take 2 tablets (20 mg total) by mouth 3 (three) times daily. 12/18/23   Vu, Constance T, MD  hydrOXYzine  (ATARAX ) 25 MG tablet TAKE 1 TABLET BY MOUTH EVERY 8 HOURS AS NEEDED FOR ANXIETY. 12/18/23 12/17/24  Vu, Constance DASEN, MD  ibuprofen  (ADVIL ) 600 MG tablet Take 1 tablet (600 mg total) by mouth every 6 (six) hours as needed (Mild pain/Inflammation of warts in perianal/anogenital area). 10/25/20   McQuilla, Jai B, MD  imiquimod  (ALDARA ) 5 % cream Apply topically 3 (three) times a week. Patient not taking: Reported on 01/07/2024 06/05/23   Vu, Constance T, MD  lidocaine  (XYLOCAINE ) 5 % ointment APPLY TOPICALLY THREE TIMES DAILY AS NEEDED FOR MILD PERIANAL/ANOGENITAL PAIN Patient not taking: Reported on 01/07/2024 04/04/23   Vu, Constance T, MD  metoprolol  succinate (TOPROL -XL) 50 MG 24 hr  tablet TAKE 1 TABLET(50 MG) BY MOUTH DAILY 02/13/24   Vu, Trung T, MD  mirtazapine  (REMERON ) 15 MG tablet Take 1 tablet (15 mg total) by mouth at bedtime. Patient not taking: Reported on 01/07/2024 11/17/20   McClung, Angela M, PA-C  QUEtiapine  (SEROQUEL ) 100 MG tablet TAKE 1 TABLET BY MOUTH AT BEDTIME. Patient not taking: Reported on 01/07/2024 11/17/20 11/17/21  Danton Jon HERO, PA-C    Family History Family History  Problem Relation Age of Onset   Thyroid disease Mother    Thyroid disease Sister    Heart disease Maternal Grandmother    Breast cancer Maternal Grandmother     Social History Social History   Tobacco Use   Smoking status: Every Day    Current packs/day: 1.00    Average packs/day: 1 pack/day for 15.0 years (15.0 ttl pk-yrs)    Types: Cigarettes   Smokeless tobacco: Never  Vaping Use   Vaping status: Never Used  Substance Use Topics   Alcohol use: Never   Drug use: Not Currently    Types: Marijuana, Cocaine, Amphetamines, Benzodiazepines, Crack cocaine    Comment: Sober x 2 years 4 months (01/2023)     Allergies   Patient has no known allergies.   Review of Systems Review of Systems  Respiratory:  Positive for cough and wheezing.   Per HPI   Physical Exam Triage Vital Signs ED Triage Vitals  Encounter Vitals Group     BP 03/07/24 1523 (!) 146/95     Girls Systolic BP Percentile --      Girls Diastolic BP Percentile --      Boys Systolic BP Percentile --      Boys Diastolic BP Percentile --      Pulse Rate 03/07/24 1523 63     Resp 03/07/24 1523 18     Temp 03/07/24 1523 98.2 F (36.8 C)     Temp Source 03/07/24 1523 Oral     SpO2 03/07/24 1523 97 %     Weight --      Height --      Head Circumference --      Peak Flow --      Pain Score 03/07/24 1522 0     Pain Loc --      Pain Education --      Exclude from Growth Chart --    No data found.  Updated Vital Signs BP (!) 146/95 (BP Location: Left Arm)   Pulse 63   Temp 98.2 F (36.8 C)  (Oral)   Resp 18   SpO2 97%   Visual Acuity Right Eye Distance:   Left Eye Distance:   Bilateral Distance:    Right Eye Near:   Left Eye Near:    Bilateral Near:     Physical Exam Vitals and nursing note reviewed.  Constitutional:      Appearance: He is not ill-appearing or toxic-appearing.  HENT:     Head: Normocephalic and atraumatic.     Right Ear: Hearing, tympanic membrane, ear canal and external ear normal.     Left Ear: Hearing, tympanic membrane, ear canal  and external ear normal.     Nose: Nose normal.     Mouth/Throat:     Lips: Pink.     Mouth: Mucous membranes are moist. No injury or oral lesions.     Dentition: Normal dentition.     Tongue: No lesions.     Pharynx: Oropharynx is clear. Uvula midline. No pharyngeal swelling, oropharyngeal exudate, posterior oropharyngeal erythema, uvula swelling or postnasal drip.     Tonsils: No tonsillar exudate.  Eyes:     General: Lids are normal. Vision grossly intact. Gaze aligned appropriately.     Extraocular Movements: Extraocular movements intact.     Conjunctiva/sclera: Conjunctivae normal.  Neck:     Trachea: Trachea and phonation normal.  Cardiovascular:     Rate and Rhythm: Normal rate and regular rhythm.     Heart sounds: Normal heart sounds, S1 normal and S2 normal.  Pulmonary:     Effort: Pulmonary effort is normal. No respiratory distress.     Breath sounds: Normal air entry. Wheezing (Right lower lung field wheeze with diminished breath sounds throughout.) present. No rhonchi or rales.     Comments: Speaking in full sentences without difficulty. Chest:     Chest wall: No tenderness.  Musculoskeletal:     Cervical back: Neck supple.  Lymphadenopathy:     Cervical: No cervical adenopathy.  Skin:    General: Skin is warm and dry.     Capillary Refill: Capillary refill takes less than 2 seconds.     Findings: No rash.  Neurological:     General: No focal deficit present.     Mental Status: He is alert  and oriented to person, place, and time. Mental status is at baseline.     Cranial Nerves: No dysarthria or facial asymmetry.  Psychiatric:        Mood and Affect: Mood normal.        Speech: Speech normal.        Behavior: Behavior normal.        Thought Content: Thought content normal.        Judgment: Judgment normal.      UC Treatments / Results  Labs (all labs ordered are listed, but only abnormal results are displayed) Labs Reviewed - No data to display  EKG   Radiology DG Chest 2 View Result Date: 03/07/2024 CLINICAL DATA:  Productive cough with shortness of breath for 2 months. HIV positive. Smoker. EXAM: CHEST - 2 VIEW COMPARISON:  Radiographs 09/05/2018 and 07/03/2013.  CT 09/05/2018. FINDINGS: The heart size and mediastinal contours are normal. The lungs are clear. There is no pleural effusion or pneumothorax. No acute osseous findings are identified. Mild degenerative changes in the spine. IMPRESSION: No evidence of active cardiopulmonary process. Electronically Signed   By: Elsie Perone M.D.   On: 03/07/2024 17:13    Procedures Procedures (including critical care time)  Medications Ordered in UC Medications  albuterol  (VENTOLIN  HFA) 108 (90 Base) MCG/ACT inhaler 2 puff (has no administration in time range)  AeroChamber Plus Flo-Vu Medium MISC 1 each (has no administration in time range)  methylPREDNISolone  sodium succinate (SOLU-MEDROL ) 125 mg/2 mL injection 80 mg (has no administration in time range)  ipratropium-albuterol  (DUONEB) 0.5-2.5 (3) MG/3ML nebulizer solution 3 mL (3 mLs Nebulization Given 03/07/24 1626)    Initial Impression / Assessment and Plan / UC Course  I have reviewed the triage vital signs and the nursing notes.  Pertinent labs & imaging results that were available during my care  of the patient were reviewed by me and considered in my medical decision making (see chart for details).   1.  Acute bronchitis, shortness of breath Evaluation  suggests viral bronchitis, though given patient's history and risk for pneumonia, chest x-ray ordered. Chest x-ray negative for acute cardiopulmonary abnormality per radiology re-read.   DuoNeb breathing treatment and Solu-Medrol  80 mg IM given in clinic with significant improvement in lung sounds and shortness of breath on reassessment.   Doxycycline  twice daily for 7 days to treat any atypical bacterial infection to the chest.  Recommend treatment with steroid, bronchodilator, cough suppressants for symptomatic relief, and expectorants (mucinex) as needed- see AVS.   Counseled patient on potential for adverse effects with medications prescribed/recommended today, strict ER and return-to-clinic precautions discussed, patient verbalized understanding.    Final Clinical Impressions(s) / UC Diagnoses   Final diagnoses:  Shortness of breath  Acute bronchitis, unspecified organism  Cigarette nicotine  dependence without complication     Discharge Instructions      You have bronchitis which is inflammation of the upper airways in your lungs due to a virus.   Take doxycycline  antibiotic every 12 hours for the next 7 days with food.  This antibiotic will be used to cover any atypical bacterial infection to the chest.  We gave you a shot of steroid in the clinic today, start taking prednisone  pills once daily for the next 5 days starting tomorrow. Do not take any NSAIDs with steroid pills (no ibuprofen , naproxen  while taking steroid, this could cause stomach upset).   Use albuterol  every 4-6 hours as needed for cough, shortness of breath, and wheezing.   Use guaifenesin (plain mucinex) to break up congestion in nose/chest so that you are able to excrete easier. Drink plenty of fluids to stay well hydrated while taking mucinex so that it works well in the body.   If you develop any new or worsening symptoms or if your symptoms do not start to improve, please return here or follow-up with your  primary care provider. If your symptoms are severe, please go to the emergency room.      ED Prescriptions     Medication Sig Dispense Auth. Provider   predniSONE  (DELTASONE ) 20 MG tablet Take 2 tablets (40 mg total) by mouth daily with breakfast for 5 days. 10 tablet Enedelia Dorna HERO, FNP   doxycycline  (VIBRAMYCIN ) 100 MG capsule Take 1 capsule (100 mg total) by mouth 2 (two) times daily for 7 days. 14 capsule Emma Schupp M, FNP   promethazine -dextromethorphan (PROMETHAZINE -DM) 6.25-15 MG/5ML syrup Take 5 mLs by mouth at bedtime as needed for cough. 118 mL Enedelia Dorna HERO, FNP      PDMP not reviewed this encounter.   Enedelia Dorna HERO, [place] 03/07/24 1726

## 2024-03-07 NOTE — ED Triage Notes (Signed)
 Pt has had cough and wheezing x 2 months. Pt states cough is productive with yellow sputum. Pt also states he has been wheezing. Pt denies pain but says he's uncomfortable at night when it's the worst. Pt denies any fevers. Pt denies any n/v, or diarrhea.

## 2024-03-07 NOTE — Discharge Instructions (Addendum)
 You have bronchitis which is inflammation of the upper airways in your lungs due to a virus.   Take doxycycline  antibiotic every 12 hours for the next 7 days with food.  This antibiotic will be used to cover any atypical bacterial infection to the chest.  We gave you a shot of steroid in the clinic today, start taking prednisone  pills once daily for the next 5 days starting tomorrow. Do not take any NSAIDs with steroid pills (no ibuprofen , naproxen  while taking steroid, this could cause stomach upset).   Use albuterol  every 4-6 hours as needed for cough, shortness of breath, and wheezing.   Use guaifenesin (plain mucinex) to break up congestion in nose/chest so that you are able to excrete easier. Drink plenty of fluids to stay well hydrated while taking mucinex so that it works well in the body.   If you develop any new or worsening symptoms or if your symptoms do not start to improve, please return here or follow-up with your primary care provider. If your symptoms are severe, please go to the emergency room.

## 2024-03-09 ENCOUNTER — Other Ambulatory Visit: Payer: Self-pay

## 2024-03-09 ENCOUNTER — Ambulatory Visit: Payer: MEDICAID

## 2024-03-27 ENCOUNTER — Other Ambulatory Visit: Payer: Self-pay | Admitting: Internal Medicine

## 2024-03-30 NOTE — Telephone Encounter (Signed)
 Please advise, patient does not have PCP.

## 2024-07-09 ENCOUNTER — Ambulatory Visit: Payer: MEDICAID | Admitting: Internal Medicine

## 2024-08-03 ENCOUNTER — Ambulatory Visit (HOSPITAL_COMMUNITY)
Admission: EM | Admit: 2024-08-03 | Discharge: 2024-08-03 | Disposition: A | Discharge status: Home / Self Care | Payer: MEDICAID | Attending: Psychiatry | Admitting: Psychiatry

## 2024-08-03 ENCOUNTER — Other Ambulatory Visit (HOSPITAL_COMMUNITY)
Admission: EM | Admit: 2024-08-03 | Discharge: 2024-08-05 | Disposition: A | Payer: MEDICAID | Source: Intra-hospital | Attending: Psychiatry | Admitting: Psychiatry

## 2024-08-03 DIAGNOSIS — F1110 Opioid abuse, uncomplicated: Secondary | ICD-10-CM | POA: Diagnosis not present

## 2024-08-03 DIAGNOSIS — Z79624 Long term (current) use of inhibitors of nucleotide synthesis: Secondary | ICD-10-CM | POA: Diagnosis not present

## 2024-08-03 DIAGNOSIS — F419 Anxiety disorder, unspecified: Secondary | ICD-10-CM

## 2024-08-03 DIAGNOSIS — Z9151 Personal history of suicidal behavior: Secondary | ICD-10-CM | POA: Insufficient documentation

## 2024-08-03 DIAGNOSIS — F1120 Opioid dependence, uncomplicated: Secondary | ICD-10-CM | POA: Diagnosis not present

## 2024-08-03 DIAGNOSIS — F1490 Cocaine use, unspecified, uncomplicated: Secondary | ICD-10-CM | POA: Diagnosis not present

## 2024-08-03 DIAGNOSIS — F411 Generalized anxiety disorder: Secondary | ICD-10-CM | POA: Insufficient documentation

## 2024-08-03 DIAGNOSIS — F1020 Alcohol dependence, uncomplicated: Secondary | ICD-10-CM

## 2024-08-03 DIAGNOSIS — R45851 Suicidal ideations: Secondary | ICD-10-CM | POA: Diagnosis not present

## 2024-08-03 DIAGNOSIS — F3181 Bipolar II disorder: Secondary | ICD-10-CM | POA: Diagnosis not present

## 2024-08-03 DIAGNOSIS — B20 Human immunodeficiency virus [HIV] disease: Secondary | ICD-10-CM

## 2024-08-03 DIAGNOSIS — F1920 Other psychoactive substance dependence, uncomplicated: Secondary | ICD-10-CM | POA: Diagnosis not present

## 2024-08-03 DIAGNOSIS — Z62819 Personal history of unspecified abuse in childhood: Secondary | ICD-10-CM | POA: Insufficient documentation

## 2024-08-03 DIAGNOSIS — F331 Major depressive disorder, recurrent, moderate: Secondary | ICD-10-CM

## 2024-08-03 DIAGNOSIS — Z79899 Other long term (current) drug therapy: Secondary | ICD-10-CM | POA: Diagnosis not present

## 2024-08-03 LAB — CBC WITH DIFFERENTIAL/PLATELET
Abs Immature Granulocytes: 0.01 K/uL (ref 0.00–0.07)
Basophils Absolute: 0.1 K/uL (ref 0.0–0.1)
Basophils Relative: 1 %
Eosinophils Absolute: 0.8 K/uL — ABNORMAL HIGH (ref 0.0–0.5)
Eosinophils Relative: 17 %
HCT: 47.8 % (ref 39.0–52.0)
Hemoglobin: 17.1 g/dL — ABNORMAL HIGH (ref 13.0–17.0)
Immature Granulocytes: 0 %
Lymphocytes Relative: 41 %
Lymphs Abs: 2 K/uL (ref 0.7–4.0)
MCH: 34.1 pg — ABNORMAL HIGH (ref 26.0–34.0)
MCHC: 35.8 g/dL (ref 30.0–36.0)
MCV: 95.2 fL (ref 80.0–100.0)
Monocytes Absolute: 0.3 K/uL (ref 0.1–1.0)
Monocytes Relative: 6 %
Neutro Abs: 1.6 K/uL — ABNORMAL LOW (ref 1.7–7.7)
Neutrophils Relative %: 35 %
Platelets: 232 K/uL (ref 150–400)
RBC: 5.02 MIL/uL (ref 4.22–5.81)
RDW: 12 % (ref 11.5–15.5)
WBC: 4.7 K/uL (ref 4.0–10.5)
nRBC: 0 % (ref 0.0–0.2)

## 2024-08-03 LAB — COMPREHENSIVE METABOLIC PANEL WITH GFR
ALT: 26 U/L (ref 0–44)
AST: 24 U/L (ref 15–41)
Albumin: 4 g/dL (ref 3.5–5.0)
Alkaline Phosphatase: 54 U/L (ref 38–126)
Anion gap: 10 (ref 5–15)
BUN: 8 mg/dL (ref 6–20)
CO2: 27 mmol/L (ref 22–32)
Calcium: 9.2 mg/dL (ref 8.9–10.3)
Chloride: 104 mmol/L (ref 98–111)
Creatinine, Ser: 0.93 mg/dL (ref 0.61–1.24)
GFR, Estimated: >60 mL/min (ref >60)
Glucose, Bld: 87 mg/dL (ref 70–99)
Potassium: 3.7 mmol/L (ref 3.5–5.1)
Sodium: 141 mmol/L (ref 135–145)
Total Bilirubin: 0.7 mg/dL (ref 0.0–1.2)
Total Protein: 6.9 g/dL (ref 6.5–8.1)

## 2024-08-03 LAB — ETHANOL: Alcohol, Ethyl (B): <15 mg/dL (ref <15)

## 2024-08-03 LAB — POCT URINE DRUG SCREEN - MANUAL ENTRY (I-SCREEN)
POC Amphetamine UR: NOT DETECTED
POC Buprenorphine (BUP): NOT DETECTED
POC Cocaine UR: POSITIVE — AB
POC Marijuana UR: NOT DETECTED
POC Methadone UR: NOT DETECTED
POC Methamphetamine UR: NOT DETECTED
POC Morphine: NOT DETECTED
POC Oxazepam (BZO): NOT DETECTED
POC Oxycodone UR: NOT DETECTED
POC Secobarbital (BAR): NOT DETECTED

## 2024-08-03 LAB — URINALYSIS, COMPLETE (UACMP) WITH MICROSCOPIC
Bacteria, UA: NONE SEEN
Bilirubin Urine: NEGATIVE
Glucose, UA: NEGATIVE mg/dL
Hgb urine dipstick: NEGATIVE
Ketones, ur: NEGATIVE mg/dL
Leukocytes,Ua: NEGATIVE
Nitrite: NEGATIVE
Protein, ur: NEGATIVE mg/dL
Specific Gravity, Urine: 1.002 — ABNORMAL LOW (ref 1.005–1.030)
pH: 7 (ref 5.0–8.0)

## 2024-08-03 LAB — LIPID PANEL
Cholesterol: 196 mg/dL (ref 0–200)
HDL: 55 mg/dL (ref >40)
LDL Cholesterol: 116 mg/dL — ABNORMAL HIGH (ref 0–99)
Total CHOL/HDL Ratio: 3.6 ratio
Triglycerides: 123 mg/dL (ref <150)
VLDL: 25 mg/dL (ref 0–40)

## 2024-08-03 LAB — HEPATITIS PANEL, ACUTE
HCV Ab: NONREACTIVE
Hep A IgM: NONREACTIVE
Hep B C IgM: NONREACTIVE
Hepatitis B Surface Ag: NONREACTIVE

## 2024-08-03 LAB — TSH: TSH: 1.879 u[IU]/mL (ref 0.350–4.500)

## 2024-08-03 LAB — MAGNESIUM: Magnesium: 2.1 mg/dL (ref 1.7–2.4)

## 2024-08-03 MED ORDER — MAGNESIUM HYDROXIDE 400 MG/5ML PO SUSP: 30.0000 mL | Freq: Every day | ORAL | Status: DC | PRN

## 2024-08-03 MED ORDER — BUSPIRONE HCL 10 MG PO TABS: 20.0000 mg | ORAL_TABLET | Freq: Three times a day (TID) | ORAL | Status: DC

## 2024-08-03 MED ORDER — HYDROXYZINE HCL 25 MG PO TABS
25.0000 mg | ORAL_TABLET | Freq: Two times a day (BID) | ORAL | Status: DC
  Administered 2024-08-03 – 2024-08-04 (×2): 25 mg via ORAL
  Filled 2024-08-03 (×2): qty 1

## 2024-08-03 MED ORDER — BICTEGRAVIR-EMTRICITAB-TENOFOV 50-200-25 MG PO TABS: 1.0000 | ORAL_TABLET | Freq: Every day | ORAL | Status: DC

## 2024-08-03 MED ORDER — DIPHENHYDRAMINE HCL 50 MG/ML IJ SOLN: 50.0000 mg | Freq: Three times a day (TID) | INTRAMUSCULAR | Status: DC | PRN

## 2024-08-03 MED ORDER — TRAZODONE HCL 50 MG PO TABS
50.0000 mg | ORAL_TABLET | Freq: Every evening | ORAL | Status: DC | PRN
  Filled 2024-08-03: qty 1

## 2024-08-03 MED ORDER — ACETAMINOPHEN 325 MG PO TABS: 650.0000 mg | ORAL_TABLET | Freq: Four times a day (QID) | ORAL | Status: DC | PRN

## 2024-08-03 MED ORDER — HALOPERIDOL 5 MG PO TABS: 5.0000 mg | ORAL_TABLET | Freq: Three times a day (TID) | ORAL | Status: DC | PRN

## 2024-08-03 MED ORDER — HALOPERIDOL LACTATE 5 MG/ML IJ SOLN: 5.0000 mg | Freq: Three times a day (TID) | INTRAMUSCULAR | Status: DC | PRN

## 2024-08-03 MED ORDER — BIKTARVY 50-200-25 MG PO TABS: 1.0000 | ORAL_TABLET | Freq: Every day | ORAL | Status: DC

## 2024-08-03 MED ORDER — ALUM & MAG HYDROXIDE-SIMETH 200-200-20 MG/5ML PO SUSP: 30.0000 mL | ORAL | Status: DC | PRN

## 2024-08-03 MED ORDER — HYDROXYZINE HCL 25 MG PO TABS
25.0000 mg | ORAL_TABLET | Freq: Three times a day (TID) | ORAL | Status: DC | PRN
  Administered 2024-08-04 – 2024-08-05 (×2): 25 mg via ORAL
  Filled 2024-08-03 (×2): qty 1

## 2024-08-03 MED ORDER — DIPHENHYDRAMINE HCL 50 MG PO CAPS: 50.0000 mg | ORAL_CAPSULE | Freq: Three times a day (TID) | ORAL | Status: DC | PRN

## 2024-08-03 MED ORDER — HYDROXYZINE HCL 25 MG PO TABS: 25.0000 mg | ORAL_TABLET | Freq: Two times a day (BID) | ORAL | Status: DC

## 2024-08-03 MED ORDER — NICOTINE 21 MG/24HR TD PT24
21.0000 mg | MEDICATED_PATCH | Freq: Every day | TRANSDERMAL | Status: DC
  Administered 2024-08-04 – 2024-08-05 (×2): 21 mg via TRANSDERMAL
  Filled 2024-08-03 (×2): qty 1

## 2024-08-03 MED ORDER — LORAZEPAM 2 MG/ML IJ SOLN: 2.0000 mg | Freq: Three times a day (TID) | INTRAMUSCULAR | Status: DC | PRN

## 2024-08-03 NOTE — ED Notes (Signed)
Pt transferred to [facility]. 

## 2024-08-03 NOTE — ED Notes (Signed)
 Patient is in the bedroom calm and composed, NAD. Denies needing anything.  Environment secured per policy. Will monitor for safety.

## 2024-08-03 NOTE — ED Provider Notes (Signed)
 [facility] Medical Screening Exam  Patient Name: Henry [name] MRN: [id number] Date of Evaluation: 08/03/24 Chief Complaint: I'm looking for inpatient.  Diagnosis:  Final diagnoses:  Polysubstance dependence including opioid type drug, episodic abuse (HCC)  Major depressive disorder, recurrent episode, moderate (HCC)  Alcohol use disorder, moderate, dependence (HCC)   History of Present illness: Henry [name] is a 43 y.o. male. presenting to [facility] accompanied by his partner. Pt states he has been going through a lot and is on the verge of losing his mind. Pt appears to be tearful throughout triage and has depressed mood. Pt states that he has thought of a plan to OD on medication. Pt reports that if he were to leave here today he would attempt to end his life. Pt states he is taking Buspar  at this time but had recently stopped taking hisother anti depressants. Pt states he is looking for inpatient at this time to stabalize his ongoing depression and suicidal thoughts. Pt denies substance use in the past 24 hours, Hi and AVH.   Flowsheet Row ED from 08/03/2024 in [facility] UC from 03/07/2024 in [facility] (Discharged[facility]  C-SSRS RISK CATEGORY High Risk No Risk High Risk    Psychiatric Specialty Exam  Presentation  General Appearance:Appropriate for Environment  Eye Contact:Minimal  Speech:Clear and Coherent  Speech Volume:Decreased  Handedness:No data recorded  Mood and Affect  Mood: Anxious; Depressed; Hopeless  Affect: Congruent   Thought Process  Thought Processes: Coherent  Descriptions of Associations:Intact  Orientation:Full (Time, Place and Person)  Thought Content:Logical  Diagnosis of Schizophrenia or Schizoaffective disorder in past: No  Duration of Psychotic Symptoms: No data recorded Hallucinations:None  Ideas of  Reference:None  Suicidal Thoughts:Yes, Active With Plan  Homicidal Thoughts:No   Sensorium  Memory: Immediate Fair; Recent Fair; Remote Fair  Judgment: Impaired  Insight: Fair   Chartered Certified Accountant: Fair  Attention Span: Fair  Recall: [employer] of Knowledge: Fair  Language: Fair   Psychomotor Activity  Psychomotor Activity: Decreased   Assets  Assets: Manufacturing Systems Engineer; Desire for Improvement   Sleep  Sleep: Poor  Number of hours: No data recorded  Physical Exam: Physical Exam ROS Blood pressure 123/67, pulse 88, temperature 98.9 F (37.2 C), temperature source Oral, resp. rate 20, SpO2 99%. There is no height or weight on file to calculate BMI.  Musculoskeletal: Strength & Muscle Tone: within normal limits Gait & Station: normal Patient leans: N/A   [facility] MSE Discharge Disposition for Follow up and Recommendations: Direct admit to [facility] for treatment of MDD w/ SI w/ plan in context of polysubstance use disorder.    KANDI JAYSON HAHN, MD 08/03/2024, 4:14 PM
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
## 2024-08-03 NOTE — Discharge Instructions (Signed)
 Admit to [facility] Dx polysubstance use disorder, alcohol use disorder, major depressive disorder-recurrent in context of SI w/ plan.

## 2024-08-03 NOTE — ED Notes (Signed)
 Patient alert & oriented x4. Pt admitted to [facility] unit. Pt endorses intent to harm self states plan to overdose on medications, DR. Bethea aware. Pt contracts for safety.  Denies AVH. No signs of acute distress noted. No inappropriate behaviors observed or reported. Encouraged patient to notify staff if any thoughts of harm towards self or others arise. Patient verbalizes understanding and agreement.dinner offered. Pt skin intact. Pt oriented to unit. Pt calm and cooperative.

## 2024-08-03 NOTE — Progress Notes (Signed)
   08/03/24 1329  [facility] Triage Screening (Walk-ins at [facility] only)  How Did You Hear About Us ? Family/Friend  What Is the Reason for Your Visit/Call Today? Henry [name] is a 43 year old male presenting to [facility] accompanied by his partner. Pt states he has been going through a lot and is on the verge of losing his mind. Pt appears to be tearful throughout triage and has depressed mood. Pt states that he has thought of a plan to OD on medication. Pt reports that if he were to leave here today he would attempt to end his life. Pt states he is taking Buspar  at this time but had recently stopped taking hisother anti depressants. Pt states he is looking for inpatient at this time to stabalize his ongoing depression and suicidal thoughts. Pt denies substance use in the past 24 hours, Hi and AVH.  How Long Has This Been Causing You Problems? 1 wk - 1 month  Have You Recently Had Any Thoughts About Hurting Yourself? Yes  How long ago did you have thoughts about hurting yourself? today  Are You Planning to Commit Suicide/Harm Yourself At This time? Yes  Have you Recently Had Thoughts About Hurting Someone Sherral? No  Are You Planning To Harm Someone At This Time? No  Physical Abuse Denies  Verbal Abuse Denies  Sexual Abuse Denies  Exploitation of patient/patient's resources Denies  Self-Neglect Denies  Possible abuse reported to: Other (Comment)  Are you currently experiencing any auditory, visual or other hallucinations? No  Have You Used Any Alcohol or Drugs in the Past 24 Hours? No  Do you have any current medical co-morbidities that require immediate attention? No  What Do You Feel Would Help You the Most Today? Treatment for Depression or other mood problem;Stress Management;Medication(s)  If access to [facility] was not available, would you have sought care in the Emergency Department? No  Determination of Need Urgent (48 hours)  Options For Referral Intensive Outpatient Therapy;Medication Management   Determination of Need filed? Yes

## 2024-08-03 NOTE — Group Note (Signed)
 Group Topic: Social Support  Group Date: 08/03/2024 Start Time: 2000 End Time: 2100 Facilitators: Joan Plowman B  Department: [facility]  Number of Participants: 6  Group Focus: daily focus and goals/reality orientation Treatment Modality:  Individual Therapy Interventions utilized were leisure development Purpose: express feelings  Name: Henry [name] Date of Birth: 12-02-1980  MR: [id number]    Level of Participation: PT DID NOT ATTEND GROUPS   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) 10/18/2020   Screening examination for venereal disease 07/14/2015   Encounter for long-term (current) use of medications 07/14/2015   Polysubstance dependence including opioid type drug, episodic abuse (HCC) 09/06/2014   Major depression, recurrent 09/06/2014   ETOH abuse 09/04/2014   Major depressive disorder, recurrent episode, moderate (HCC) 09/04/2014   Generalized anxiety disorder 09/04/2014   Alcohol abuse 02/16/2014   Cocaine abuse (HCC) 02/16/2014   

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
## 2024-08-04 ENCOUNTER — Encounter (HOSPITAL_COMMUNITY): Payer: Self-pay | Admitting: Psychiatry

## 2024-08-04 ENCOUNTER — Other Ambulatory Visit: Payer: Self-pay

## 2024-08-04 DIAGNOSIS — Z79899 Other long term (current) drug therapy: Secondary | ICD-10-CM | POA: Diagnosis not present

## 2024-08-04 DIAGNOSIS — F1020 Alcohol dependence, uncomplicated: Secondary | ICD-10-CM | POA: Diagnosis not present

## 2024-08-04 DIAGNOSIS — R45851 Suicidal ideations: Secondary | ICD-10-CM | POA: Diagnosis not present

## 2024-08-04 DIAGNOSIS — F1920 Other psychoactive substance dependence, uncomplicated: Secondary | ICD-10-CM | POA: Diagnosis not present

## 2024-08-04 LAB — SYPHILIS: RPR W/REFLEX TO RPR TITER AND TREPONEMAL ANTIBODIES, TRADITIONAL SCREENING AND DIAGNOSIS ALGORITHM: RPR Ser Ql: NONREACTIVE

## 2024-08-04 LAB — HEMOGLOBIN A1C
Hgb A1c MFr Bld: 5.5 % (ref 4.8–5.6)
Mean Plasma Glucose: 111 mg/dL

## 2024-08-04 MED ORDER — AMLODIPINE BESYLATE 5 MG PO TABS
5.0000 mg | ORAL_TABLET | Freq: Every day | ORAL | Status: DC
  Administered 2024-08-04 – 2024-08-05 (×2): 5 mg via ORAL
  Filled 2024-08-04 (×2): qty 1

## 2024-08-04 MED ORDER — BUSPIRONE HCL 10 MG PO TABS
20.0000 mg | ORAL_TABLET | Freq: Two times a day (BID) | ORAL | Status: DC
  Administered 2024-08-04 – 2024-08-05 (×3): 20 mg via ORAL
  Filled 2024-08-04 (×3): qty 2

## 2024-08-04 MED ORDER — BICTEGRAVIR-EMTRICITAB-TENOFOV 50-200-25 MG PO TABS
1.0000 | ORAL_TABLET | Freq: Every day | ORAL | Status: DC
  Administered 2024-08-04 – 2024-08-05 (×2): 1 via ORAL
  Filled 2024-08-04 (×2): qty 1

## 2024-08-04 NOTE — ED Provider Notes (Signed)
 Behavioral Health Progress Note  Date and Time: 08/04/2024 1:25 PM Name: Henry [name] MRN:  [id number]  Subjective: Patient states I woke up this morning and I knew that I was going to be okay.  Henry [name] shares that his sister discussed completion of involuntary commitment petition earlier this week.  Patient states that is not why I am here I am here for me.  Henry [name] made a statement to his sister that you will collect my life insurance soon prior to arrival.  Patient denies any plan or intent to complete suicide.  Reports feeling that with increased use of substance he may inadvertently end his life. Upon admission patient endorses SI with plan to overdose on medications.   Patient is insightful today.  He would like residential substance use treatment.  Patient with history of positive experience after 3 months stay at [facility] in 2022 sober x 3.5 years.  Relapsed 3 months ago cocaine and alcohol primarily.    Henry [name] denies SI/HI/AVH today.  He is appropriate with staff and peers.  Attending meals and in community dining area.  Reports attending groups.  He is compliant with medications as offered.  No symptoms of withdrawal.  Patient is reassessed by this nurse practitioner face-to-face.  He is seated, no apparent distress.  He is alert and oriented, pleasant and cooperative during assessment.  Patient presents with depressed mood.  PHQ-9 score today elevated at 20.  Patient reports depressed mood is because of my situation.  Discussed medication alternatives to address depressed mood, patient declines at this time.  Elevated blood pressure today, measures 140/110. Henry [name] is a limited historian regarding his medication prior to arrival.  Patient reports previously prescribed metoprolol  by infectious disease provider, stopped this medication approximately 1 month ago after he would not give it to me anymore.  Prior to metoprolol  Rx, patient stable on amlodipine unable to recall why medication  updated.  Reviewed medications including amlodipine, discussed potential side effects and offered patient opportunity to ask questions.  Patient remains voluntary.  Patient offered support and encouragement.  He verbalizes understanding of treatment plan to include residential substance use treatment following [facility] admission and discharge.  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, benzodiazepine abuse, substance-induced mood disorder, alcohol use disorder Past Medical History: HIV, HTN Family Psychiatric  History: None reported Social History: Resides with significant other, employed, endorses substance use primarily alcohol and cocaine  Additional Social History:                         Sleep: Good  Appetite:  Fair  Current Medications:  Current Facility-Administered Medications  Medication Dose Route Frequency Provider Last Rate Last Admin   acetaminophen  (TYLENOL ) tablet 650 mg  650 mg Oral Q6H PRN Bethea, Terrence C, MD       alum & mag hydroxide-simeth (MAALOX/MYLANTA) 200-200-20 MG/5ML suspension 30 mL  30 mL Oral Q4H PRN Bethea, Terrence C, MD       amLODipine (NORVASC) tablet 5 mg  5 mg Oral Daily Dasie Ellouise CROME, FNP   5 mg at 08/04/24 1123   bictegravir-emtricitabine -tenofovir  AF (BIKTARVY ) 50-200-25 MG per tablet  1 tablet  1 tablet Oral Daily Cole Kandi BROCKS, MD   1 tablet at 08/04/24 9163   busPIRone  (BUSPAR ) tablet 20 mg  20 mg Oral BID Bethea, Terrence C, MD   20 mg at 08/04/24 0837   haloperidol (HALDOL) tablet 5 mg  5 mg Oral TID PRN Cole Kandi BROCKS, MD       And   diphenhydrAMINE  (BENADRYL ) capsule 50 mg  50 mg Oral TID PRN Bethea, Terrence C, MD       haloperidol lactate (HALDOL) injection 5 mg  5 mg Intramuscular TID PRN Cole Kandi BROCKS, MD       And   diphenhydrAMINE  (BENADRYL ) injection 50 mg  50 mg Intramuscular TID PRN Cole Kandi BROCKS, MD       And   LORazepam  (ATIVAN ) injection 2 mg  2 mg Intramuscular TID PRN Cole Kandi BROCKS, MD       hydrOXYzine  (ATARAX ) tablet 25 mg  25 mg Oral TID PRN Bethea, Terrence C, MD       magnesium  hydroxide (MILK OF MAGNESIA) suspension 30 mL  30 mL Oral Daily PRN Cole Kandi BROCKS, MD       nicotine  (NICODERM CQ  - dosed in mg/24 hours) patch 21 mg  21 mg Transdermal Q0600 Cole Kandi BROCKS, MD   21 mg at 08/04/24 0836   traZODone  (DESYREL ) tablet 50 mg  50 mg Oral QHS PRN Cole Kandi BROCKS, MD       Current Outpatient Medications  Medication Sig Dispense Refill   ascorbic acid (VITAMIN C) 500 MG tablet Take 1,000 mg by mouth daily.     bictegravir-emtricitabine -tenofovir  AF (BIKTARVY ) 50-200-25 MG TABS tablet Take 1 tablet by mouth daily.     busPIRone  (BUSPAR ) 10 MG tablet Take 2 tablets (20 mg total) by mouth 3 (three) times daily.     hydrOXYzine  (ATARAX ) 25 MG tablet Take 1 tablet (25 mg total) by mouth 2 (two) times daily.     ibuprofen  (ADVIL ) 200 MG tablet Take 200-800 mg by mouth every 6 (six) hours as needed (For pain).     VITAMIN E PO Take 1 tablet by mouth daily.      Labs  Lab Results:  Admission on 08/03/2024  Component Date Value Ref Range Status   WBC 08/03/2024 4.7  4.0 - 10.5 K/uL Final   RBC 08/03/2024 5.02  4.22 - 5.81 MIL/uL Final   Hemoglobin 08/03/2024 17.1 (H)  13.0 - 17.0 g/dL Final   HCT 87/98/7974 47.8  39.0 - 52.0 % Final   MCV 08/03/2024 95.2  80.0 - 100.0 fL Final   MCH 08/03/2024 34.1 (H)  26.0 - 34.0 pg Final   MCHC 08/03/2024 35.8  30.0 - 36.0 g/dL Final   RDW 87/98/7974 12.0  11.5 - 15.5 % Final   Platelets 08/03/2024 232  150 - 400 K/uL  Final   nRBC 08/03/2024 0.0  0.0 - 0.2 % Final   Neutrophils Relative % 08/03/2024 35  % Final   Neutro Abs 08/03/2024 1.6 (L)  1.7 - 7.7 K/uL Final   Lymphocytes Relative 08/03/2024 41  % Final   Lymphs Abs 08/03/2024 2.0  0.7 - 4.0 K/uL Final   Monocytes Relative 08/03/2024 6  % Final   Monocytes Absolute 08/03/2024 0.3  0.1 - 1.0 K/uL Final   Eosinophils Relative 08/03/2024 17  % Final   Eosinophils Absolute 08/03/2024 0.8 (H)  0.0 - 0.5 K/uL Final   Basophils Relative 08/03/2024 1  % Final  Basophils Absolute 08/03/2024 0.1  0.0 - 0.1 K/uL Final   Immature Granulocytes 08/03/2024 0  % Final   Abs Immature Granulocytes 08/03/2024 0.01  0.00 - 0.07 K/uL Final   Performed at [facility], [address] [place], [place] [address]   Sodium 08/03/2024 141  135 - 145 mmol/L Final   Potassium 08/03/2024 3.7  3.5 - 5.1 mmol/L Final   Chloride 08/03/2024 104  98 - 111 mmol/L Final   CO2 08/03/2024 27  22 - 32 mmol/L Final   Glucose, Bld 08/03/2024 87  70 - 99 mg/dL Final   Glucose reference range applies only to samples taken after fasting for at least 8 hours.   BUN 08/03/2024 8  6 - 20 mg/dL Final   Creatinine, Ser 08/03/2024 0.93  0.61 - 1.24 mg/dL Final   Calcium 87/98/7974 9.2  8.9 - 10.3 mg/dL Final   Total Protein 87/98/7974 6.9  6.5 - 8.1 g/dL Final   Albumin 87/98/7974 4.0  3.5 - 5.0 g/dL Final   AST 87/98/7974 24  15 - 41 U/L Final   ALT 08/03/2024 26  0 - 44 U/L Final   Alkaline Phosphatase 08/03/2024 54  38 - 126 U/L Final   Total Bilirubin 08/03/2024 0.7  0.0 - 1.2 mg/dL Final   GFR, Estimated 08/03/2024 >60  >60 mL/min Final   Comment: (NOTE) Calculated using the CKD-EPI Creatinine Equation (2021)    Anion gap 08/03/2024 10  5 - 15 Final   Performed at [facility], [address] [place], [place] [address]   Magnesium  08/03/2024 2.1  1.7 - 2.4 mg/dL Final   Performed at [facility], [address] [place], [place] [address]   Alcohol, Ethyl (B)  08/03/2024 <15  <15 mg/dL Final   Comment: (NOTE) For medical purposes only. Performed at [facility], [address] [place], [place] [address]    Cholesterol 08/03/2024 196  0 - 200 mg/dL Final   Triglycerides 87/98/7974 123  <150 mg/dL Final   HDL 87/98/7974 55  >40 mg/dL Final   Total CHOL/HDL Ratio 08/03/2024 3.6  RATIO Final   VLDL 08/03/2024 25  0 - 40 mg/dL Final   LDL Cholesterol 08/03/2024 116 (H)  0 - 99 mg/dL Final   Comment:        Total Cholesterol/HDL:CHD Risk Coronary Heart Disease Risk Table                     Men   Women  1/2 Average Risk   3.4   3.3  Average Risk       5.0   4.4  2 X Average Risk   9.6   7.1  3 X Average Risk  23.4   11.0        Use the calculated Patient Ratio above and the CHD Risk Table to determine the patient's CHD Risk.        ATP III CLASSIFICATION (LDL):  <100     mg/dL   Optimal  899-870  mg/dL   Near or Above                    Optimal  130-159  mg/dL   Borderline  839-810  mg/dL   High  >809     mg/dL   Very High Performed at [facility], [address], [place], [place] [address]    TSH 08/03/2024 1.879  0.350 -  4.500 uIU/mL Final   Comment: Performed by a 3rd Generation assay with a functional sensitivity of <=0.01 uIU/mL. Performed at [facility], [address], [place], [place] [address]    Hepatitis B Surface Ag 08/03/2024 NON REACTIVE  NON REACTIVE Final   HCV Ab 08/03/2024 NON REACTIVE  NON REACTIVE Final   Comment: (NOTE) Nonreactive HCV antibody screen is consistent with no HCV infections,  unless recent infection is suspected or other evidence exists to indicate HCV infection.     Hep A IgM 08/03/2024 NON REACTIVE  NON REACTIVE Final   Hep B C IgM 08/03/2024 NON REACTIVE  NON REACTIVE Final   Performed at [facility], [address], [place], [place] [address]   RPR Ser Ql 08/03/2024 NON REACTIVE  NON REACTIVE Final   Performed at [facility], [address]  [place], [place] [address]   Color, Urine 08/03/2024 STRAW (A)  YELLOW Final   APPearance 08/03/2024 CLEAR  CLEAR Final   Specific Gravity, Urine 08/03/2024 1.002 (L)  1.005 - 1.030 Final   pH 08/03/2024 7.0  5.0 - 8.0 Final   Glucose, UA 08/03/2024 NEGATIVE  NEGATIVE mg/dL Final   Hgb urine dipstick 08/03/2024 NEGATIVE  NEGATIVE Final   Bilirubin Urine 08/03/2024 NEGATIVE  NEGATIVE Final   Ketones, ur 08/03/2024 NEGATIVE  NEGATIVE mg/dL Final   Protein, ur 87/98/7974 NEGATIVE  NEGATIVE mg/dL Final   Nitrite 87/98/7974 NEGATIVE  NEGATIVE Final   Leukocytes,Ua 08/03/2024 NEGATIVE  NEGATIVE Final   RBC / HPF 08/03/2024 0-5  0 - 5 RBC/hpf Final   WBC, UA 08/03/2024 0-5  0 - 5 WBC/hpf Final   Bacteria, UA 08/03/2024 NONE SEEN  NONE SEEN Final   Squamous Epithelial / HPF 08/03/2024 0-5  0 - 5 /HPF Final   Performed at [facility], [address], [place], [place] [address]   POC Amphetamine UR 08/03/2024 None Detected  NONE DETECTED (Cut Off Level 1000 ng/mL) Final   POC Secobarbital (BAR) 08/03/2024 None Detected  NONE DETECTED (Cut Off Level 300 ng/mL) Final   POC Buprenorphine (BUP) 08/03/2024 None Detected  NONE DETECTED (Cut Off Level 10 ng/mL) Final   POC Oxazepam (BZO) 08/03/2024 None Detected  NONE DETECTED (Cut Off Level 300 ng/mL) Final   POC Cocaine UR 08/03/2024 Positive (A)  NONE DETECTED (Cut Off Level 300 ng/mL) Final   POC Methamphetamine UR 08/03/2024 None Detected  NONE DETECTED (Cut Off Level 1000 ng/mL) Final   POC Morphine 08/03/2024 None Detected  NONE DETECTED (Cut Off Level 300 ng/mL) Final   POC Methadone UR 08/03/2024 None Detected  NONE DETECTED (Cut Off Level 300 ng/mL) Final   POC Oxycodone UR 08/03/2024 None Detected  NONE DETECTED (Cut Off Level 100 ng/mL) Final   POC Marijuana UR 08/03/2024 None Detected  NONE DETECTED (Cut Off Level 50 ng/mL) Final    Blood Alcohol level:  Lab Results  Component Value Date   ETH <15 08/03/2024   ETH 23 (H) 10/16/2020     Metabolic Disorder Labs: Lab Results  Component Value Date   HGBA1C 5.4 10/18/2020   MPG 108 10/18/2020   No results found for: PROLACTIN Lab Results  Component Value Date   CHOL 196 08/03/2024   TRIG 123 08/03/2024   HDL 55 08/03/2024   CHOLHDL 3.6 08/03/2024   VLDL 25 08/03/2024   LDLCALC 116 (H) 08/03/2024   LDLCALC 98 10/25/2021    Therapeutic Lab Levels: No results found  for: LITHIUM No results found for: VALPROATE No results found for: CBMZ  Physical Findings   AIMS    Flowsheet Row [facility]) [facility]  AIMS Total Score 0 0   AUDIT    Flowsheet Row [facility] Ad[facility] (Dis[facility] Ad[facility] ([facility]) [facility] Ad[facility] (Dis[facility]  Alcohol Use Disorder Identification Test Final Score (AUDIT) 9 24 23  35   GAD-7    Flowsheet Row Office Visit from 01/07/2024 in [facility] [place]. [facility]  Total GAD-7 Score 13   PHQ2-9    Flowsheet Row ED from 08/03/2024 in [facility] Office Visit from 01/07/2024 in [facility] - [facility] Dept Of [place]. [facility] Office Visit from 07/11/2023 in [facility] - A Dept Of [place]. [facility] Office Visit from 01/10/2023 in [facility] - A Dept Of [place]. [facility] Office Visit from 10/25/2021 in [facility] - A Dept Of [place]. [facility]  PHQ-2 Total Score 4 0 0 0 0  PHQ-9 Total Score 20 0 -- -- --   Flowsheet Row ED from  08/03/2024 in [facility] Most recent reading at 08/03/2024  6:21 PM ED from 08/03/2024 in [facility] Most recent reading at 08/03/2024  1:45 PM UC from 03/07/2024 in [facility] [place] Most recent reading at 03/07/2024  3:23 PM  C-SSRS RISK CATEGORY High Risk High Risk No Risk     Musculoskeletal  Strength & Muscle Tone: within normal limits Gait & Station: normal Patient leans: N/A  Psychiatric Specialty Exam  Presentation  General Appearance:  Appropriate for Environment; Casual  Eye Contact: Good  Speech: Clear and Coherent; Normal Rate  Speech Volume: Normal  Handedness: Right   Mood and Affect  Mood: Depressed  Affect: Depressed   Thought Process  Thought Processes: Coherent; Goal Directed; Linear  Descriptions of Associations:Intact  Orientation:Full (Time, Place and Person)  Thought Content:Logical; WDL  Diagnosis of Schizophrenia or Schizoaffective disorder in past: No  Duration of Psychotic Symptoms: No data recorded  Hallucinations:Hallucinations: None  Ideas of Reference:None  Suicidal Thoughts:Suicidal Thoughts: No SI Active Intent and/or Plan: With Plan  Homicidal Thoughts:Homicidal Thoughts: No   Sensorium  Memory: Immediate Good; Recent Fair  Judgment: Fair  Insight: Fair   Executive Functions  Concentration: Good  Attention Span: Good  Recall: Good  Fund of Knowledge: Good  Language: Good   Psychomotor Activity  Psychomotor Activity: Psychomotor Activity: Normal   Assets  Assets: Communication Skills; Desire for Improvement; Financial Resources/Insurance; Resilience; Social Support   Sleep  Sleep: Sleep: Fair  Estimated Sleeping Duration (Last 24 Hours): 9.75-13.75 hours  Nutritional Assessment (For OBS and [facility] admissions only) Has the patient had a weight loss or gain of 10 pounds or more in the last 3 months?: No Has the  patient had a decrease in food intake/or appetite?: No Does the patient have dental problems?: No Does the patient have eating habits or behaviors that may be indicators of  an eating disorder including binging or inducing vomiting?: No Has the patient recently lost weight without trying?: 0 Has the patient been eating poorly because of a decreased appetite?: 0 Malnutrition Screening Tool Score: 0    Physical Exam  Physical Exam Vitals and nursing note reviewed.  Constitutional:      General: He is not in acute distress.    Appearance: Normal appearance. He is well-developed.  HENT:     Head: Normocephalic and atraumatic.     Nose: Nose normal.  Eyes:     Conjunctiva/sclera: Conjunctivae normal.  Cardiovascular:     Rate and Rhythm: Normal rate.     Heart sounds: No murmur heard. Pulmonary:     Effort: Pulmonary effort is normal. No respiratory distress.  Abdominal:     Palpations: Abdomen is soft.     Tenderness: There is no abdominal tenderness.  Musculoskeletal:        General: No swelling. Normal range of motion.     Cervical back: Normal range of motion.  Skin:    General: Skin is warm and dry.     Capillary Refill: Capillary refill takes less than 2 seconds.  Neurological:     Mental Status: He is alert.  Psychiatric:        Attention and Perception: Attention and perception normal.        Mood and Affect: Affect normal. Mood is depressed.        Speech: Speech normal.        Behavior: Behavior normal. Behavior is cooperative.        Thought Content: Thought content normal.        Cognition and Memory: Cognition and memory normal.        Judgment: Judgment normal.    Review of Systems  Constitutional: Negative.   HENT: Negative.    Eyes: Negative.   Respiratory: Negative.    Cardiovascular: Negative.   Gastrointestinal: Negative.   Genitourinary: Negative.   Musculoskeletal: Negative.   Skin: Negative.   Neurological: Negative.   Psychiatric/Behavioral:   Positive for depression and substance abuse.    Blood pressure (!) 140/110, pulse 71, temperature 98.2 F (36.8 C), temperature source Oral, resp. rate 17, SpO2 98%. There is no height or weight on file to calculate BMI.  Treatment Plan Summary: Daily contact with patient to assess and evaluate symptoms and progress in treatment and Medication management  EKG: NSR/QTc measures .  Alcohol use disorder, moderate, dependence: -08/03/2024 BAL<15 -Initiate CIWA scoring every 12 hours  Polysubstance dependence including opioid substance: 08/03/2024 -UDS positive cocaine -Initiate COWS scoring every 12 hours  Major depressive disorder, recurrent, severe: 08/04/2024-PHQ9 score 20 Reviewed medications to address mood, patient declines.  Continue to monitor, can consider mood stabilizer.   HIV Disease: Restart Biktarvy  50 - 200 - 25 mg 1 tablet daily  HTN: -Start amlodipine 5 mg daily  Continue current medications: -Acetaminophen  650 mg every 6 hours, as needed/mild pain -Maalox 30 mL oral every 4 hours, as needed/digestion -BuSpar  20 mg twice daily -Hydroxyzine  25 mg 3 times daily as needed/anxiety -Magnesium  hydroxide 30 mL daily as needed/mild constipation -Trazodone  50 mg nightly, as needed/sleep -NicoDerm 21 mg transdermal patch daily/nicotine  withdrawal  Agitation protocol: MILD -Haloperidol 5 mg 3 times daily as needed mild agitation  -Diphenhydramine  50 mg p.o. 3 times daily as needed mild agitation  MODERATE -Haloperidol 5 mg IM 3 times daily as needed/moderate agitation -Diphenhydramine  50 mg IM 3 times daily as needed/moderate agitation -Lorazepam  2 mg  IM 3 times daily as needed/moderate agitation  SEVERE -Haloperidol  10 mg IM 3 times daily as needed severe agitation -Diphenhydramine  50 mg IM 3 times daily as needed/severe agitation -Lorazepam  2 mg IM 3 times daily as needed/severe agitation   Dispo: rehab facility.   Ellouise LITTIE Dawn, FNP 08/04/2024 1:25  PM

## 2024-08-04 NOTE — ED Notes (Signed)
 Pt resting in bed, no acute distress noted. Respirations even and unlabored. Continue to monitor for safety.

## 2024-08-04 NOTE — Group Note (Signed)
 Group Topic: Recovery Basics  Group Date: 08/04/2024 Start Time: 1100 End Time: 1150 Facilitators: Gerome Jolly, NT  Department: [facility]  Number of Participants: 3  Group Focus: acceptance Treatment Modality:  Cognitive Behavioral Therapy Interventions utilized were exploration Purpose: explore maladaptive thinking, regain self-worth, and relapse prevention strategies  Name: Henry [name] Date of Birth: Jan 31, 1981  MR: [id number]    Level of Participation: Pt did not attend group Quality of Participation: na Interactions with others: na Mood/Affect: na Triggers (if applicable): na Cognition: na Progress: na Response: na Plan: referral / recommendations patient  has been referred for inpatient treatment  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) 10/18/2020   Screening examination for venereal disease 07/14/2015   Encounter for long-term (current) use of medications 07/14/2015   Polysubstance dependence including opioid type drug, episodic abuse (HCC) 09/06/2014   Major depression, recurrent 09/06/2014   ETOH abuse 09/04/2014   Major depressive disorder, recurrent episode, moderate (HCC) 09/04/2014   Generalized anxiety disorder 09/04/2014   Alcohol abuse 02/16/2014   Cocaine abuse (HCC) 02/16/2014   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

## 2024-08-04 NOTE — Group Note (Signed)
 Group Topic: Social Support  Group Date: 08/04/2024 Start Time: 2000 End Time: 2030 Facilitators: Joan Plowman B  Department: [facility]  Number of Participants: 1  Group Focus: check in Treatment Modality:  Individual Therapy Interventions utilized were support Purpose: express feelings  Name: Henry [name] Date of Birth: Jan 06, 1981  MR: [id number]    Level of Participation: PT DID NOT ATTEND GROUP  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) 10/18/2020   Screening examination for venereal disease 07/14/2015   Encounter for long-term (current) use of medications 07/14/2015   Polysubstance dependence including opioid type drug, episodic abuse (HCC) 09/06/2014   Major depression, recurrent 09/06/2014   ETOH abuse 09/04/2014   Major depressive disorder, recurrent episode, moderate (HCC) 09/04/2014   Generalized anxiety disorder 09/04/2014   Alcohol abuse 02/16/2014   Cocaine abuse (HCC) 02/16/2014   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

## 2024-08-04 NOTE — ED Notes (Signed)
 Pt denies SI/HI/AVH. No reports of pain. Compliant with scheduled meds; prn atarax  given. Pt spent shift in his room and watching TV in the dayroom with peers, appropriate interactions. He was calm and cooperative. No acute distress noted. Pt is currently sleeping in bed. Respirations even and unlabored. Continue to monitor for safety.

## 2024-08-04 NOTE — Group Note (Signed)
 Group Topic: Positive Affirmations  Group Date: 08/04/2024 Start Time: 1500 End Time: 1540 Facilitators: Elnor Keven SAILOR  Department: [facility]  Number of Participants: 6  Group Focus: affirmation Treatment Modality:  Psychoeducation Interventions utilized were support Purpose: reinforce self-care  Name: Henry [name] Date of Birth: 07-Sep-1980  MR: [id number]    Level of Participation: active Quality of Participation: cooperative Interactions with others: gave feedback Mood/Affect: appropriate Triggers (if applicable): NA Cognition: coherent/clear Progress: Moderate Response: Discussed importance of positive self affirmations. Shared affirmation about being unique and expressed strong self confidence.  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) 10/18/2020   Screening examination for venereal disease 07/14/2015   Encounter for long-term (current) use of medications 07/14/2015   Polysubstance dependence including opioid type drug, episodic abuse (HCC) 09/06/2014   Major depression, recurrent 09/06/2014   ETOH abuse 09/04/2014   Major depressive disorder, recurrent episode, moderate (HCC) 09/04/2014   Generalized anxiety disorder 09/04/2014   Alcohol abuse 02/16/2014   Cocaine abuse (HCC) 02/16/2014   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

## 2024-08-04 NOTE — ED Notes (Signed)
 Patient is currently eating dinner with latest Ciwa/Cow score of 0.. Patient denies any major s/s of withdrawal and remains cooperative in unit.

## 2024-08-04 NOTE — ED Notes (Signed)
 Patient is in the bedroom calm and sleeping. NAD Will monitor for safety.

## 2024-08-04 NOTE — ED Notes (Signed)
 Patient presents with flat/anxious affect. Patient initially anxious due to worrying about his medications. Provider notified and spoke with patient and ordered scheduled medications. Patient endorses passive SI but denies HI and A/V/H with no plan or intent. Patient is med compliant, ate breakfast, and remains cooperative in unit.

## 2024-08-04 NOTE — Discharge Instructions (Signed)
 Patient is instructed prior to discharge to:  Take all medications as prescribed by his/her mental healthcare provider. Report any adverse effects and or reactions from the medicines to his/her outpatient provider promptly. Keep all scheduled appointments, to ensure that you are getting refills on time and to avoid any interruption in your medication.  If you are unable to keep an appointment call to reschedule.  Be sure to follow-up with resources and follow-up appointments provided.  Patient has been instructed & cautioned: To not engage in alcohol and or illegal drug use while on prescription medicines. In the event of worsening symptoms, patient is instructed to call the crisis hotline, 911 and or go to the nearest ED for appropriate evaluation and treatment of symptoms. To follow-up with his/her primary care provider for your other medical issues, concerns and or health care needs.  Information: -National Suicide Prevention Lifeline 1-800-SUICIDE or 7015707950.  -988 offers 24/7 access to trained crisis counselors who can help people experiencing mental health-related distress. People can call or text 988 or chat 988lifeline.org for themselves or if they are worried about a loved one who may need crisis support.       [facility] CARE MANAGEMENT...  Patient requested inpatient treatment  Per [facility] [place]  Patient has been accepted to their program  Patient can discharge Wednesday 08/05/2024   [facility] [address]  [place], [place] [address]    [facility] will arrange transportation Writer requested pick-up by 10:00 am  Scripts

## 2024-08-04 NOTE — ED Notes (Signed)
 Patient is in the bedroom calm and sleeping. NAD, Respirations even and unlabored. Will monitor for safety.

## 2024-08-04 NOTE — Group Note (Signed)
 Group Topic: Recovery Basics  Group Date: 08/04/2024 Start Time: 1600 End Time: 1630 Facilitators: Byrd, Keyri Salberg, RN  Department: [facility]  Number of Participants: 6  Group Focus: check in, communication, and nursing group Treatment Modality:  Psychoeducation Interventions utilized were clarification and patient education Purpose: express feelings, improve communication skills, and increase insight  Name: Henry [name] Date of Birth: 12-Nov-1980  MR: [id number]    Level of Participation: Did not attend Quality of Participation:  Interactions with others:  Mood/Affect:  Triggers (if applicable):  Cognition:  Progress:  Response:  Plan:   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) 10/18/2020   Screening examination for venereal disease 07/14/2015   Encounter for long-term (current) use of medications 07/14/2015   Polysubstance dependence including opioid type drug, episodic abuse (HCC) 09/06/2014   Major depression, recurrent 09/06/2014   ETOH abuse 09/04/2014   Major depressive disorder, recurrent episode, moderate (HCC) 09/04/2014   Generalized anxiety disorder 09/04/2014   Alcohol abuse 02/16/2014   Cocaine abuse (HCC) 02/16/2014   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

## 2024-08-04 NOTE — Care Management (Addendum)
 [facility] Care Management.Scientist, Research (medical) met with the client to discuss discharge planning.  Client reports he's seeking residential treatment and is open to any program.  Writer informed the client the stay here is typically 3-5 days.    Writer explored with the client treatment facilities such as [facility] and [facility].  Client reports being open to both facilities.    Writer sent referrals to [facility] and [facility] [place].   UPDATE:   Client completed the phone interview with [facility] and was accepted.  [facility] will pick up the client tomorrow and transport him to the facility.

## 2024-08-05 DIAGNOSIS — R45851 Suicidal ideations: Secondary | ICD-10-CM | POA: Diagnosis not present

## 2024-08-05 DIAGNOSIS — Z79899 Other long term (current) drug therapy: Secondary | ICD-10-CM | POA: Diagnosis not present

## 2024-08-05 DIAGNOSIS — F1020 Alcohol dependence, uncomplicated: Secondary | ICD-10-CM | POA: Diagnosis not present

## 2024-08-05 DIAGNOSIS — F1920 Other psychoactive substance dependence, uncomplicated: Secondary | ICD-10-CM | POA: Diagnosis not present

## 2024-08-05 MED ORDER — BICTEGRAVIR-EMTRICITAB-TENOFOV 50-200-25 MG PO TABS: 1.0000 | ORAL_TABLET | Freq: Every day | ORAL | 0 refills | Status: DC

## 2024-08-05 MED ORDER — BUSPIRONE HCL 10 MG PO TABS: 20.0000 mg | ORAL_TABLET | Freq: Two times a day (BID) | ORAL | 0 refills | Status: DC

## 2024-08-05 MED ORDER — HYDROXYZINE HCL 25 MG PO TABS: 25.0000 mg | ORAL_TABLET | Freq: Three times a day (TID) | ORAL | 0 refills | Status: DC | PRN

## 2024-08-05 MED ORDER — NICOTINE 21 MG/24HR TD PT24: 21.0000 mg | MEDICATED_PATCH | Freq: Every day | TRANSDERMAL | 0 refills | Status: AC

## 2024-08-05 MED ORDER — AMLODIPINE BESYLATE 5 MG PO TABS: 5.0000 mg | ORAL_TABLET | Freq: Every day | ORAL | 0 refills | Status: DC

## 2024-08-05 MED ORDER — TRAZODONE HCL 50 MG PO TABS: 50.0000 mg | ORAL_TABLET | Freq: Every evening | ORAL | 0 refills | Status: AC | PRN

## 2024-08-05 NOTE — Group Note (Signed)
 Group Topic: Relapse and Recovery  Group Date: 08/05/2024 Start Time: 1100 End Time: 1200 Facilitators: Curren Mohrmann, LCSW  Department: [facility]  Number of Participants: 4  Group Focus: self-awareness, self-esteem, and substance abuse education Treatment Modality:  Cognitive Behavioral Therapy Interventions utilized were patient education and problem solving Purpose: enhance coping skills, express feelings, express irrational fears, improve communication skills, relapse prevention strategies, and trigger / craving management  Writer explored with each client triggers and coping skills they would like to incorporate moving forward.  Writer explored past substance use and what brought them to [facility].  Writer utilized CBT and MI to explore future goals and progress.  Name: Henry [name] Date of Birth: [birth year]  MR: [id number]    Level of Participation: active Quality of Participation: cooperative, engaged, offered feedback, and supportive Interactions with others: gave feedback Mood/Affect: appropriate, bright, and positive Triggers (if applicable): Not having peace and people who are users.  Cognition: insightful and logical Progress: Gaining insight Response: Client is optimistic about going today to [facility]. Plan: patient will be encouraged to continue with outpatient treatment after completing his residential stay.   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) 10/18/2020   Screening examination for venereal disease 07/14/2015   Encounter for long-term (current) use of medications 07/14/2015   Polysubstance dependence including  opioid type drug, episodic abuse (HCC) 09/06/2014   Major depression, recurrent 09/06/2014   ETOH abuse 09/04/2014   Major depressive disorder, recurrent episode, moderate (HCC) 09/04/2014   Generalized anxiety disorder 09/04/2014   Alcohol abuse 02/16/2014   Cocaine abuse (HCC) 02/16/2014   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

## 2024-08-05 NOTE — ED Notes (Signed)
 Pt sleeping in bed, no acute distress noted. Respirations even and unlabored. Continue to monitor for safety.

## 2024-08-05 NOTE — ED Notes (Signed)
 Patient sitting in dayroom interacting participating in SW group. No acute distress noted. No concerns voiced. No inappropriate behaviors observed or reported at this time. Informed patient to notify staff with any needs or assistance. Patient verbalized understanding or agreement. Safety checks in place per facility policy.

## 2024-08-05 NOTE — ED Notes (Signed)
 Patient sitting in dayroom calm and comosed. No acute distress noted. No concerns voiced. No inappropriate behaviors observed or reported at this time. Informed patient to notify staff with any needs or assistance. Patient verbalized understanding or agreement. Safety checks in place per facility policy.
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
## 2024-08-05 NOTE — ED Notes (Signed)
 Patient discharged to [facility] per provider order. After Visit Summary (AVS) printed and given to patient, as well as printed prescriptions. AVS reviewed with patient and all questions fully answered. Patient discharged in no acute distress, A& O x4 and ambulatory. Patient denied SI/HI, A/VH upon discharge. Patient verbalized understanding of all discharge instructions explained by staff, including follow up appointments, RX's and safety plan. Patient mood fair. Patient belongings returned to patient from locker #19 complete and intact. Patient escorted to lobby via staff for transport to destination. Safety maintained.

## 2024-08-05 NOTE — ED Notes (Signed)
 Patient sitting in bedroom calm and composed. No acute distress noted. No concerns voiced. No inappropriate behaviors observed or reported at this time. Informed patient to notify staff with any needs or assistance. Patient verbalized understanding or agreement. Safety checks in place per facility policy.

## 2024-08-05 NOTE — ED Notes (Signed)
 Patient alert & oriented x4. Denies intent to harm self or others when asked. Denies A/VH. Patient denies any physical complaints when asked. Scheduled medications administered with no complications. No acute distress noted. Support and encouragement provided. Patient observed in milieu. No inappropriate behaviors observed or reported. Routine safety checks conducted per facility protocol. Encouraged patient to notify staff if any thoughts of harm towards self or others arise. Patient verbalizes understanding and agreement.

## 2024-08-05 NOTE — Group Note (Signed)
 Group Topic: Recovery Basics  Group Date: 08/05/2024 Start Time: 1400 End Time: 1500 Facilitators: Elnor Keven SAILOR  Department: [facility]  Number of Participants: 5  Group Focus: abuse issues Treatment Modality:  Psychoeducation Interventions utilized were support Purpose: increase insight  Name: Henry [name] Date of Birth: 12-10-1980  MR: [id number]    Level of Participation: moderate Quality of Participation: engaged Interactions with others: gave feedback Mood/Affect: appropriate Triggers (if applicable): NA Cognition: coherent/clear Progress: Moderate Response: Discussed experiences with recovery and substance use. Shared goals about treatment moving forward.  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) 10/18/2020   Screening examination for venereal disease 07/14/2015   Encounter for long-term (current) use of medications 07/14/2015   Polysubstance dependence including opioid type drug, episodic abuse (HCC) 09/06/2014   Major depression, recurrent 09/06/2014   ETOH abuse 09/04/2014   Major depressive disorder, recurrent episode, moderate (HCC) 09/04/2014   Generalized anxiety disorder 09/04/2014   Alcohol abuse 02/16/2014   Cocaine abuse (HCC) 02/16/2014   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

## 2024-09-29 ENCOUNTER — Telehealth: Payer: Self-pay | Admitting: Internal Medicine

## 2024-09-29 NOTE — Telephone Encounter (Signed)
 Henry [name] LVM requesting an appt and provided an updated contact number. I LVM requesting a call back to schedule and updated his chart with the provided number.

## 2024-10-01 ENCOUNTER — Encounter: Payer: Self-pay | Admitting: Internal Medicine

## 2024-10-01 ENCOUNTER — Other Ambulatory Visit: Payer: Self-pay

## 2024-10-01 ENCOUNTER — Ambulatory Visit
Admission: RE | Admit: 2024-10-01 | Discharge: 2024-10-01 | Disposition: A | Discharge status: Home / Self Care | Payer: MEDICAID | Source: Ambulatory Visit | Attending: Internal Medicine | Admitting: Internal Medicine

## 2024-10-01 ENCOUNTER — Ambulatory Visit (INDEPENDENT_AMBULATORY_CARE_PROVIDER_SITE_OTHER): Payer: MEDICAID | Admitting: Internal Medicine

## 2024-10-01 VITALS — BP 156/99 | HR 80 | Temp 98.1°F | Ht 73.0 in | Wt 235.0 lb

## 2024-10-01 DIAGNOSIS — Z79899 Other long term (current) drug therapy: Secondary | ICD-10-CM

## 2024-10-01 DIAGNOSIS — B20 Human immunodeficiency virus [HIV] disease: Secondary | ICD-10-CM | POA: Diagnosis not present

## 2024-10-01 DIAGNOSIS — F419 Anxiety disorder, unspecified: Secondary | ICD-10-CM | POA: Diagnosis not present

## 2024-10-01 DIAGNOSIS — Z8613 Personal history of malaria: Secondary | ICD-10-CM | POA: Diagnosis not present

## 2024-10-01 DIAGNOSIS — I159 Secondary hypertension, unspecified: Secondary | ICD-10-CM | POA: Diagnosis not present

## 2024-10-01 DIAGNOSIS — R058 Other specified cough: Secondary | ICD-10-CM

## 2024-10-01 DIAGNOSIS — R053 Chronic cough: Secondary | ICD-10-CM

## 2024-10-01 MED ORDER — BICTEGRAVIR-EMTRICITAB-TENOFOV 50-200-25 MG PO TABS: 1.0000 | ORAL_TABLET | Freq: Every day | ORAL | 11 refills | Status: AC

## 2024-10-01 MED ORDER — AMLODIPINE BESYLATE 5 MG PO TABS: 5.0000 mg | ORAL_TABLET | Freq: Every day | ORAL | 2 refills | Status: AC

## 2024-10-01 MED ORDER — BUSPIRONE HCL 10 MG PO TABS: 20.0000 mg | ORAL_TABLET | Freq: Two times a day (BID) | ORAL | 2 refills | Status: AC

## 2024-10-01 MED ORDER — HYDROCODONE BIT-HOMATROP MBR 5-1.5 MG/5ML PO SOLN: 5.0000 mL | Freq: Four times a day (QID) | ORAL | 0 refills | Status: AC | PRN

## 2024-10-01 MED ORDER — HYDROXYZINE HCL 25 MG PO TABS: 25.0000 mg | ORAL_TABLET | Freq: Three times a day (TID) | ORAL | 2 refills | Status: AC | PRN

## 2024-10-01 NOTE — Patient Instructions (Addendum)
 For symptoms care, goals are to help you breath and sleep with less cough, and headache treatment  -use ibuprofen  as needed for ache -nyquil nightime as needed for cough -- I have prescribed a stronger cough medication/syrup in case -use neti pot (over the counter) for nasal congestion, along with phenylephrine (over counter)    I will order chest xray to make sure you have no pneumonia    Continue biktarvy      Partner and your std testings can be done any time here just check with front desk

## 2024-10-01 NOTE — Progress Notes (Signed)
 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) 10/18/2020   Screening examination for venereal disease 07/14/2015   Encounter for long-term (current) use of medications 07/14/2015   Polysubstance dependence including opioid type drug, episodic abuse (HCC) 09/06/2014   Major depression, recurrent 09/06/2014   ETOH abuse 09/04/2014   Major depressive disorder, recurrent episode, moderate (HCC) 09/04/2014   Generalized anxiety disorder 09/04/2014   Alcohol abuse 02/16/2014   Cocaine abuse (HCC) 02/16/2014   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 male depression/anxiety, bipolar, hx substance abuse, hiv here for f/u hiv care   10/01/24 id clinic f/u Had cough/nasal congestion last several weeks Feels well otherwise Compliant with biktarvy   Jumping headache here and there, noticed usually mid day and night -- 2 weeks. Stable.  No birds at home No n/v No rash No joint pain/swelling (chronic intermittent left lateral hip pain unchanged) No change in job  Patient taking ibuprofen /mucinex/night time nyquil. works for cough and headache.   He wants renewal of bp and anxiety meds. Not yet have pcp   01/07/24 id clinic f/u Social -- sexually active/not in relationship. 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 No issue with biktarvy   Lives with his same friend Sexually active - not in a steady relationship  Recent left thumb paronychia but resolved  Had stopped hyroxyzine as he thinks he has night terror    --------------------  01/10/2023 initial visit with me: OSH chart from [facility] in [place], [place] #ypc Dx'ed in [facility] 2007-[facility] Risk MSM Cd4 nadir: Hx OI -- none No hx of AIDS Previously seen at [facility] prior to moving to a different town in [place] Therapy:  2020-c Biktarvy  Previously on stribild , genvoya  Genotype: Hx [id number] per previous chart  Very compliant when he is on meds -- no missed dose the last 4 weeks of biktarvy    #hx 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 with 3 weekly bicilin which he finished   #genital warts Imiquimod  5% cream three times weekly -- currently not needing to use   #depression/anxiety #bipolar #insomnia Buspirone  15 mg tid Hydroxyzine  25 mg tid prn melatonin  #hx etoh and INDU (cocaine), but no IVDU   #hypertension On metop xl 50 mg daily   #joint pain Nothing severe Ibuprofen  He uses from his friend's gabapentin which helps    Other meds: Mvi Vit c Vit E  #social -lives with a friend in [place] but about to move out; he has no permanent place  -sister is in [place] -not in current relationship; last time sexually active  about early 2023 -no current substance use -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 -no pets/animals -prior travels -- [place] , [place], [place]; none outside of the [place]     Review of Systems: ROS All other ros negative      Past Medical History:  Diagnosis Date   Anxiety    Bipolar 1 disorder (HCC)    HIV (human immunodeficiency virus  infection) (HCC)     Social History   Tobacco Use   Smoking status: Every Day    Current packs/day: 1.00    Average packs/day: 1 pack/day for 15.0 years (15.0 ttl pk-yrs)    Types: Cigarettes   Smokeless tobacco: Never  Vaping Use   Vaping status: Never Used  Substance Use Topics   Alcohol use: Never   Drug use: Not Currently    Types: Marijuana, Cocaine, Amphetamines, Benzodiazepines, Crack cocaine    Comment: Sober x 2 years 4 months (01/2023)    Family History  Problem Relation Age of Onset   Thyroid disease Mother    Thyroid disease Sister    Heart disease Maternal Grandmother    Breast cancer Maternal Grandmother     No Known Allergies  OBJECTIVE: Vitals:   10/01/24 0937  BP: (!) 156/99  Pulse: 80  Temp: 98.1 F (36.7 C)  TempSrc: Temporal  SpO2: 99%  Weight: 235 lb (106.6 kg)  Height: 6' 1 (1.854 m)   Body mass index is 31 kg/m.   Physical Exam General/constitutional: no distress, pleasant HEENT: Normocephalic, PER, Conj Clear, EOMI, Oropharynx clear Neck supple CV: rrr no mrg Lungs: clear to auscultation, normal respiratory effort Abd: Soft, Nontender Ext: no edema Skin: No Rash Neuro: nonfocal MSK: no peripheral joint swelling/tenderness/warmth; back spines nontender   Lab: Lab Results  Component Value Date   WBC 4.7 08/03/2024   HGB 17.1 (H) 08/03/2024   HCT 47.8 08/03/2024   MCV 95.2 08/03/2024   PLT 232 08/03/2024   Last metabolic panel Lab Results  Component Value Date   GLUCOSE 87 08/03/2024   NA 141 08/03/2024   K 3.7 08/03/2024   CL 104 08/03/2024   CO2 27 08/03/2024   BUN 8 08/03/2024   CREATININE 0.93 08/03/2024   EGFR 101 07/11/2023   CALCIUM 9.2 08/03/2024   PROT 6.9 08/03/2024   ALBUMIN 4.0 08/03/2024   BILITOT 0.7 08/03/2024   ALKPHOS 54 08/03/2024   AST 24 08/03/2024   ALT 26 08/03/2024   ANIONGAP 10 08/03/2024     HIV: 07/2023            571 (34%)     /      <20 10/29/22           562  (31%)    /        <20 02/2022                                  /      <20 03/2020            524  (31%)    /  HIV genotype: 2018 rcid genotype testing no PI or nrti/nnrti resistance   Microbiology:  Serology:   Imaging:   Assessment/plan: Problem List Items Addressed This Visit       Other   HIV disease (HCC)   Relevant Medications   bictegravir-emtricitabine -tenofovir  AF (BIKTARVY ) 50-200-25 MG TABS tablet   Other Relevant Orders  DG Chest 2 View   Other Visit Diagnoses       Chronic cough    -  Primary     Post-viral cough syndrome         Secondary hypertension       Relevant Medications   amLODipine  (NORVASC ) 5 MG tablet     Anxiety       Relevant Medications   busPIRone  (BUSPAR ) 10 MG tablet   hydrOXYzine  (ATARAX ) 25 MG tablet      44 yo male bipolar, hiv infection, here for f/u  #hiv [facility] Well controlled on biktarvy   Discussed reprieve study 01/10/23; patient not interested in statin at this time  He has medicaid as of 01/10/23  Doing well on biktarvy . No question today 10/01/24. Wants to continue biktarvy    -discussed u=u -encourage compliance -continue current HIV medication -labs today -f/u in 6 months    #post-viral cough Cd4 excellent 07/2023 and no concern for atypical/oi Suspect postviral cough syndrome, however will get cxr r/o walking pna  -cxr -continue supportive care; neti pot/decongestant prn -f/u as needed -also give hycodan today as needed use   #hx syphilis Treated as late latent in 2022 with bicillin  Rpr titer 11/2020   8 06/2021   4 10/2021     2 07/2023   1 01/2024   1  He saw an outpatient clinic 08/2024 and was given 2 weeks doxy. I don't have that titer with me    #hcm -vaccine Tdap 2019 Prevnar13 2022 Meningococcal booster 2022 -hepatitis Hep b vaccination twinrix 2016 Hep b serology 01/07/24 -metabolic Will discuss -tb screening Quantiferon gold 01/07/24 -std testing Patient with long term partner (1 year as of  10/01/24); asked to defer testing today -cancer screening 01/2024 anal pap no dysplasia Will repeat 08/2025 -pcp care Encourage him to establish primary care -- patient haven't been looking    Follow-up: Return in about 11 months (around 08/31/2025).  Constance ONEIDA Passer, MD [facility] 10/01/2024, 9:49 AM  "

## 2025-08-31 ENCOUNTER — Ambulatory Visit: Payer: Self-pay | Admitting: Internal Medicine
